# Patient Record
Sex: Female | Born: 1940 | Race: Black or African American | Hispanic: No | Marital: Single | Smoking: Current every day smoker
Health system: Southern US, Community
[De-identification: ages and names within clinical notes are randomized; demographics above are authoritative.]

## PROBLEM LIST (undated history)

## (undated) DIAGNOSIS — N184 Chronic kidney disease, stage 4 (severe): Secondary | ICD-10-CM

## (undated) DIAGNOSIS — I509 Heart failure, unspecified: Secondary | ICD-10-CM

## (undated) DIAGNOSIS — I428 Other cardiomyopathies: Secondary | ICD-10-CM

## (undated) DIAGNOSIS — I10 Essential (primary) hypertension: Secondary | ICD-10-CM

## (undated) DIAGNOSIS — I878 Other specified disorders of veins: Secondary | ICD-10-CM

## (undated) DIAGNOSIS — I4891 Unspecified atrial fibrillation: Secondary | ICD-10-CM

## (undated) DIAGNOSIS — Z7901 Long term (current) use of anticoagulants: Secondary | ICD-10-CM

## (undated) DIAGNOSIS — I4729 Other ventricular tachycardia: Secondary | ICD-10-CM

## (undated) DIAGNOSIS — I472 Ventricular tachycardia: Secondary | ICD-10-CM

## (undated) DIAGNOSIS — IMO0002 Reserved for concepts with insufficient information to code with codable children: Secondary | ICD-10-CM

## (undated) DIAGNOSIS — M329 Systemic lupus erythematosus, unspecified: Secondary | ICD-10-CM

## (undated) DIAGNOSIS — D649 Anemia, unspecified: Secondary | ICD-10-CM

## (undated) DIAGNOSIS — N189 Chronic kidney disease, unspecified: Secondary | ICD-10-CM

## (undated) DIAGNOSIS — G4733 Obstructive sleep apnea (adult) (pediatric): Secondary | ICD-10-CM

## (undated) HISTORY — DX: Long term (current) use of anticoagulants: Z79.01

## (undated) HISTORY — DX: Essential (primary) hypertension: I10

## (undated) HISTORY — DX: Other specified disorders of veins: I87.8

## (undated) HISTORY — DX: Unspecified atrial fibrillation: I48.91

## (undated) HISTORY — DX: Other cardiomyopathies: I42.8

## (undated) HISTORY — DX: Other ventricular tachycardia: I47.29

## (undated) HISTORY — DX: Chronic kidney disease, stage 4 (severe): N18.4

## (undated) HISTORY — DX: Ventricular tachycardia: I47.2

## (undated) HISTORY — DX: Obstructive sleep apnea (adult) (pediatric): G47.33

---

## 1985-01-24 HISTORY — PX: APPENDECTOMY: SHX54

## 1989-01-24 HISTORY — PX: INCISION AND DRAINAGE INTRA ORAL ABSCESS: SHX1802

## 1996-01-25 DIAGNOSIS — I428 Other cardiomyopathies: Secondary | ICD-10-CM

## 1996-01-25 HISTORY — DX: Other cardiomyopathies: I42.8

## 2001-02-05 ENCOUNTER — Ambulatory Visit (HOSPITAL_COMMUNITY): Admission: RE | Admit: 2001-02-05 | Discharge: 2001-02-05 | Payer: Self-pay | Admitting: Internal Medicine

## 2001-02-05 ENCOUNTER — Encounter: Payer: Self-pay | Admitting: Internal Medicine

## 2001-02-06 ENCOUNTER — Ambulatory Visit (HOSPITAL_COMMUNITY): Admission: RE | Admit: 2001-02-06 | Discharge: 2001-02-06 | Payer: Self-pay | Admitting: Internal Medicine

## 2001-02-22 ENCOUNTER — Ambulatory Visit (HOSPITAL_COMMUNITY): Admission: RE | Admit: 2001-02-22 | Discharge: 2001-02-22 | Payer: Self-pay | Admitting: Cardiology

## 2001-03-28 ENCOUNTER — Ambulatory Visit (HOSPITAL_COMMUNITY): Admission: RE | Admit: 2001-03-28 | Discharge: 2001-03-28 | Payer: Self-pay | Admitting: Internal Medicine

## 2003-03-22 ENCOUNTER — Ambulatory Visit (HOSPITAL_COMMUNITY): Admission: RE | Admit: 2003-03-22 | Discharge: 2003-03-22 | Payer: Self-pay | Admitting: Internal Medicine

## 2004-02-06 ENCOUNTER — Ambulatory Visit (HOSPITAL_COMMUNITY): Admission: RE | Admit: 2004-02-06 | Discharge: 2004-02-06 | Payer: Self-pay | Admitting: Internal Medicine

## 2004-02-26 ENCOUNTER — Ambulatory Visit: Payer: Self-pay | Admitting: Internal Medicine

## 2004-03-11 ENCOUNTER — Ambulatory Visit: Payer: Self-pay | Admitting: *Deleted

## 2004-05-11 ENCOUNTER — Ambulatory Visit: Admission: RE | Admit: 2004-05-11 | Discharge: 2004-05-11 | Payer: Self-pay | Admitting: Pulmonary Disease

## 2004-05-12 ENCOUNTER — Ambulatory Visit (HOSPITAL_COMMUNITY): Admission: RE | Admit: 2004-05-12 | Discharge: 2004-05-12 | Payer: Self-pay | Admitting: *Deleted

## 2004-05-13 ENCOUNTER — Ambulatory Visit: Payer: Self-pay | Admitting: *Deleted

## 2004-10-27 ENCOUNTER — Ambulatory Visit: Payer: Self-pay | Admitting: *Deleted

## 2004-12-09 ENCOUNTER — Ambulatory Visit: Payer: Self-pay | Admitting: *Deleted

## 2005-04-13 ENCOUNTER — Ambulatory Visit: Payer: Self-pay | Admitting: Internal Medicine

## 2005-04-15 ENCOUNTER — Inpatient Hospital Stay (HOSPITAL_BASED_OUTPATIENT_CLINIC_OR_DEPARTMENT_OTHER): Admission: RE | Admit: 2005-04-15 | Discharge: 2005-04-15 | Payer: Self-pay | Admitting: Cardiology

## 2005-04-15 ENCOUNTER — Ambulatory Visit: Payer: Self-pay | Admitting: Cardiology

## 2005-05-03 ENCOUNTER — Ambulatory Visit (HOSPITAL_COMMUNITY): Admission: RE | Admit: 2005-05-03 | Discharge: 2005-05-03 | Payer: Self-pay | Admitting: Internal Medicine

## 2005-05-03 ENCOUNTER — Ambulatory Visit: Payer: Self-pay | Admitting: Cardiology

## 2005-05-03 ENCOUNTER — Encounter: Payer: Self-pay | Admitting: Cardiology

## 2005-05-24 HISTORY — PX: CARDIAC DEFIBRILLATOR PLACEMENT: SHX171

## 2005-05-25 ENCOUNTER — Ambulatory Visit: Payer: Self-pay | Admitting: Emergency Medicine

## 2005-05-25 ENCOUNTER — Ambulatory Visit: Payer: Self-pay | Admitting: Internal Medicine

## 2005-05-25 ENCOUNTER — Inpatient Hospital Stay (HOSPITAL_COMMUNITY): Admission: RE | Admit: 2005-05-25 | Discharge: 2005-06-02 | Payer: Self-pay | Admitting: Internal Medicine

## 2005-06-03 ENCOUNTER — Ambulatory Visit: Payer: Self-pay | Admitting: Cardiology

## 2005-06-06 ENCOUNTER — Ambulatory Visit: Payer: Self-pay | Admitting: Cardiology

## 2005-06-16 ENCOUNTER — Ambulatory Visit: Payer: Self-pay

## 2005-06-17 ENCOUNTER — Ambulatory Visit: Payer: Self-pay | Admitting: *Deleted

## 2005-07-01 ENCOUNTER — Ambulatory Visit: Payer: Self-pay | Admitting: Internal Medicine

## 2005-07-03 ENCOUNTER — Ambulatory Visit: Admission: RE | Admit: 2005-07-03 | Discharge: 2005-07-03 | Payer: Self-pay | Admitting: *Deleted

## 2005-07-04 ENCOUNTER — Ambulatory Visit: Payer: Self-pay | Admitting: *Deleted

## 2005-07-07 ENCOUNTER — Ambulatory Visit: Payer: Self-pay | Admitting: Pulmonary Disease

## 2005-07-22 ENCOUNTER — Ambulatory Visit: Payer: Self-pay | Admitting: *Deleted

## 2005-07-29 ENCOUNTER — Ambulatory Visit: Payer: Self-pay | Admitting: Internal Medicine

## 2005-08-10 ENCOUNTER — Ambulatory Visit (HOSPITAL_COMMUNITY): Admission: RE | Admit: 2005-08-10 | Discharge: 2005-08-10 | Payer: Self-pay | Admitting: Obstetrics and Gynecology

## 2005-08-11 ENCOUNTER — Ambulatory Visit: Payer: Self-pay | Admitting: *Deleted

## 2005-08-14 ENCOUNTER — Ambulatory Visit: Payer: Self-pay | Admitting: Internal Medicine

## 2005-08-24 ENCOUNTER — Ambulatory Visit: Payer: Self-pay | Admitting: Internal Medicine

## 2005-09-06 ENCOUNTER — Ambulatory Visit: Payer: Self-pay | Admitting: Internal Medicine

## 2005-09-15 ENCOUNTER — Ambulatory Visit: Payer: Self-pay | Admitting: Cardiology

## 2005-10-19 ENCOUNTER — Ambulatory Visit: Payer: Self-pay | Admitting: Cardiology

## 2005-11-03 ENCOUNTER — Ambulatory Visit: Payer: Self-pay | Admitting: Cardiology

## 2005-12-01 ENCOUNTER — Ambulatory Visit: Payer: Self-pay | Admitting: Internal Medicine

## 2005-12-08 ENCOUNTER — Ambulatory Visit: Payer: Self-pay | Admitting: Cardiology

## 2006-01-04 ENCOUNTER — Ambulatory Visit: Payer: Self-pay | Admitting: Cardiovascular Disease

## 2006-01-26 ENCOUNTER — Ambulatory Visit: Payer: Self-pay | Admitting: Cardiology

## 2006-02-15 ENCOUNTER — Ambulatory Visit: Payer: Self-pay | Admitting: Cardiology

## 2006-03-13 ENCOUNTER — Ambulatory Visit: Payer: Self-pay | Admitting: Internal Medicine

## 2006-03-23 ENCOUNTER — Ambulatory Visit: Payer: Self-pay | Admitting: Cardiology

## 2006-04-07 ENCOUNTER — Ambulatory Visit: Payer: Self-pay | Admitting: Internal Medicine

## 2006-05-05 ENCOUNTER — Ambulatory Visit: Payer: Self-pay | Admitting: Cardiology

## 2006-05-19 ENCOUNTER — Ambulatory Visit: Payer: Self-pay | Admitting: Internal Medicine

## 2006-06-02 ENCOUNTER — Ambulatory Visit: Payer: Self-pay | Admitting: Cardiology

## 2006-06-05 ENCOUNTER — Ambulatory Visit: Payer: Self-pay | Admitting: Internal Medicine

## 2006-06-16 ENCOUNTER — Ambulatory Visit: Payer: Self-pay | Admitting: Cardiology

## 2006-06-30 ENCOUNTER — Ambulatory Visit: Payer: Self-pay | Admitting: Internal Medicine

## 2006-07-14 ENCOUNTER — Ambulatory Visit: Payer: Self-pay | Admitting: Cardiovascular Disease

## 2006-08-11 ENCOUNTER — Ambulatory Visit: Payer: Self-pay | Admitting: Cardiology

## 2006-08-21 ENCOUNTER — Ambulatory Visit: Payer: Self-pay | Admitting: Internal Medicine

## 2006-08-24 ENCOUNTER — Ambulatory Visit: Payer: Self-pay | Admitting: Cardiology

## 2006-08-31 ENCOUNTER — Ambulatory Visit: Payer: Self-pay | Admitting: Cardiology

## 2006-09-14 ENCOUNTER — Ambulatory Visit: Payer: Self-pay | Admitting: Cardiology

## 2006-09-28 ENCOUNTER — Ambulatory Visit: Payer: Self-pay | Admitting: Cardiology

## 2006-10-12 ENCOUNTER — Ambulatory Visit: Payer: Self-pay | Admitting: Cardiology

## 2006-11-09 ENCOUNTER — Ambulatory Visit: Payer: Self-pay | Admitting: Cardiovascular Disease

## 2006-12-15 ENCOUNTER — Ambulatory Visit: Payer: Self-pay | Admitting: Cardiology

## 2007-01-12 ENCOUNTER — Ambulatory Visit: Payer: Self-pay | Admitting: Cardiology

## 2007-02-15 ENCOUNTER — Ambulatory Visit: Payer: Self-pay | Admitting: Internal Medicine

## 2007-03-02 ENCOUNTER — Ambulatory Visit: Payer: Self-pay | Admitting: Cardiology

## 2007-03-06 ENCOUNTER — Ambulatory Visit: Payer: Self-pay | Admitting: Internal Medicine

## 2007-03-16 ENCOUNTER — Ambulatory Visit: Payer: Self-pay | Admitting: Cardiology

## 2007-03-30 ENCOUNTER — Ambulatory Visit: Payer: Self-pay | Admitting: Cardiology

## 2007-04-24 ENCOUNTER — Inpatient Hospital Stay (HOSPITAL_COMMUNITY): Admission: EM | Admit: 2007-04-24 | Discharge: 2007-04-30 | Payer: Self-pay | Admitting: Emergency Medicine

## 2007-04-24 ENCOUNTER — Ambulatory Visit: Payer: Self-pay | Admitting: Cardiology

## 2007-05-03 ENCOUNTER — Ambulatory Visit: Payer: Self-pay | Admitting: Cardiology

## 2007-05-17 ENCOUNTER — Ambulatory Visit: Payer: Self-pay

## 2007-05-17 ENCOUNTER — Encounter: Payer: Self-pay | Admitting: Cardiovascular Disease

## 2007-05-17 ENCOUNTER — Ambulatory Visit: Payer: Self-pay | Admitting: Internal Medicine

## 2007-05-24 ENCOUNTER — Ambulatory Visit: Payer: Self-pay | Admitting: Internal Medicine

## 2007-05-24 LAB — CONVERTED CEMR LAB
Albumin: 3.6 g/dL (ref 3.5–5.2)
Alkaline Phosphatase: 74 units/L (ref 39–117)
BUN: 38 mg/dL — ABNORMAL HIGH (ref 6–23)
Bilirubin, Direct: 0.1 mg/dL (ref 0.0–0.3)
GFR calc Af Amer: 23 mL/min
GFR calc non Af Amer: 19 mL/min
Potassium: 3.8 meq/L (ref 3.5–5.1)
Sodium: 140 meq/L (ref 135–145)
Total Bilirubin: 0.6 mg/dL (ref 0.3–1.2)

## 2007-05-31 ENCOUNTER — Ambulatory Visit: Payer: Self-pay | Admitting: Cardiology

## 2007-06-11 ENCOUNTER — Ambulatory Visit: Payer: Self-pay | Admitting: Internal Medicine

## 2007-06-26 ENCOUNTER — Ambulatory Visit: Payer: Self-pay | Admitting: Cardiology

## 2007-07-10 ENCOUNTER — Ambulatory Visit: Payer: Self-pay | Admitting: Cardiology

## 2007-07-30 ENCOUNTER — Ambulatory Visit: Payer: Self-pay | Admitting: Cardiology

## 2007-08-06 ENCOUNTER — Ambulatory Visit: Payer: Self-pay | Admitting: Internal Medicine

## 2007-08-14 ENCOUNTER — Ambulatory Visit: Payer: Self-pay | Admitting: Cardiovascular Disease

## 2007-09-04 ENCOUNTER — Ambulatory Visit: Payer: Self-pay | Admitting: Cardiology

## 2007-09-19 ENCOUNTER — Ambulatory Visit: Payer: Self-pay | Admitting: Cardiology

## 2007-09-27 ENCOUNTER — Ambulatory Visit: Payer: Self-pay | Admitting: Cardiology

## 2007-10-15 ENCOUNTER — Ambulatory Visit: Payer: Self-pay | Admitting: Cardiology

## 2007-11-06 ENCOUNTER — Ambulatory Visit: Payer: Self-pay | Admitting: Internal Medicine

## 2007-11-12 ENCOUNTER — Ambulatory Visit: Payer: Self-pay | Admitting: Cardiology

## 2007-12-13 ENCOUNTER — Ambulatory Visit: Payer: Self-pay | Admitting: Cardiology

## 2007-12-13 ENCOUNTER — Ambulatory Visit (HOSPITAL_COMMUNITY): Admission: RE | Admit: 2007-12-13 | Discharge: 2007-12-13 | Payer: Self-pay | Admitting: Internal Medicine

## 2007-12-24 ENCOUNTER — Ambulatory Visit: Payer: Self-pay | Admitting: Cardiology

## 2008-01-21 ENCOUNTER — Ambulatory Visit: Payer: Self-pay | Admitting: Cardiology

## 2008-01-29 ENCOUNTER — Ambulatory Visit (HOSPITAL_COMMUNITY): Admission: RE | Admit: 2008-01-29 | Discharge: 2008-01-29 | Payer: Self-pay | Admitting: Internal Medicine

## 2008-03-20 ENCOUNTER — Ambulatory Visit: Payer: Self-pay | Admitting: Cardiology

## 2008-04-03 ENCOUNTER — Encounter: Payer: Self-pay | Admitting: Internal Medicine

## 2008-04-17 ENCOUNTER — Ambulatory Visit: Payer: Self-pay | Admitting: Cardiology

## 2008-05-22 ENCOUNTER — Ambulatory Visit: Payer: Self-pay | Admitting: Cardiology

## 2008-06-19 ENCOUNTER — Ambulatory Visit: Payer: Self-pay | Admitting: Cardiology

## 2008-07-21 ENCOUNTER — Ambulatory Visit: Payer: Self-pay | Admitting: Cardiology

## 2008-08-08 ENCOUNTER — Encounter: Payer: Self-pay | Admitting: Cardiology

## 2008-08-08 ENCOUNTER — Ambulatory Visit: Payer: Self-pay | Admitting: Cardiology

## 2008-08-08 ENCOUNTER — Ambulatory Visit (HOSPITAL_COMMUNITY): Admission: RE | Admit: 2008-08-08 | Discharge: 2008-08-08 | Payer: Self-pay | Admitting: Cardiology

## 2008-08-08 DIAGNOSIS — Z95810 Presence of automatic (implantable) cardiac defibrillator: Secondary | ICD-10-CM | POA: Insufficient documentation

## 2008-08-11 LAB — CONVERTED CEMR LAB
AST: 21 units/L (ref 0–37)
Albumin: 3.8 g/dL (ref 3.5–5.2)
Alkaline Phosphatase: 91 units/L (ref 39–117)
BUN: 32 mg/dL — ABNORMAL HIGH (ref 6–23)
Basophils Absolute: 0.1 10*3/uL (ref 0.0–0.1)
Basophils Relative: 1 % (ref 0–1)
Calcium: 8.9 mg/dL (ref 8.4–10.5)
Cholesterol: 162 mg/dL (ref 0–200)
Hemoglobin: 15.9 g/dL — ABNORMAL HIGH (ref 12.0–15.0)
LDL Cholesterol: 98 mg/dL (ref 0–99)
MCHC: 32 g/dL (ref 30.0–36.0)
Magnesium: 1.8 mg/dL (ref 1.5–2.5)
Monocytes Absolute: 0.4 10*3/uL (ref 0.1–1.0)
Monocytes Relative: 9 % (ref 3–12)
Platelets: 222 10*3/uL (ref 150–400)
Potassium: 4.3 meq/L (ref 3.5–5.3)
Total Protein: 8 g/dL (ref 6.0–8.3)
VLDL: 16 mg/dL (ref 0–40)

## 2008-08-18 ENCOUNTER — Telehealth (INDEPENDENT_AMBULATORY_CARE_PROVIDER_SITE_OTHER): Payer: Self-pay

## 2008-08-18 ENCOUNTER — Ambulatory Visit: Payer: Self-pay | Admitting: Cardiology

## 2008-09-08 ENCOUNTER — Encounter: Payer: Self-pay | Admitting: *Deleted

## 2008-09-15 ENCOUNTER — Encounter: Payer: Self-pay | Admitting: Cardiology

## 2008-09-17 ENCOUNTER — Encounter: Payer: Self-pay | Admitting: Cardiology

## 2008-09-17 LAB — CONVERTED CEMR LAB
Chloride: 103 meq/L (ref 96–112)
Potassium: 3.8 meq/L (ref 3.5–5.3)

## 2008-09-18 ENCOUNTER — Encounter (INDEPENDENT_AMBULATORY_CARE_PROVIDER_SITE_OTHER): Payer: Self-pay | Admitting: *Deleted

## 2008-09-18 ENCOUNTER — Encounter: Payer: Self-pay | Admitting: Cardiology

## 2008-09-18 ENCOUNTER — Ambulatory Visit: Payer: Self-pay | Admitting: Cardiology

## 2008-09-18 LAB — CONVERTED CEMR LAB: POC INR: 5.6

## 2008-09-26 ENCOUNTER — Ambulatory Visit: Payer: Self-pay | Admitting: Cardiology

## 2008-09-26 LAB — CONVERTED CEMR LAB: POC INR: 3

## 2008-10-09 ENCOUNTER — Ambulatory Visit: Payer: Self-pay | Admitting: Cardiology

## 2008-10-15 ENCOUNTER — Ambulatory Visit: Payer: Self-pay | Admitting: Cardiology

## 2008-10-16 ENCOUNTER — Encounter (INDEPENDENT_AMBULATORY_CARE_PROVIDER_SITE_OTHER): Payer: Self-pay | Admitting: *Deleted

## 2008-10-21 ENCOUNTER — Encounter: Payer: Self-pay | Admitting: Cardiology

## 2008-10-21 LAB — CONVERTED CEMR LAB
BUN: 33 mg/dL — ABNORMAL HIGH (ref 6–23)
Potassium: 4.4 meq/L (ref 3.5–5.3)
Sodium: 140 meq/L (ref 135–145)

## 2008-10-22 ENCOUNTER — Encounter (INDEPENDENT_AMBULATORY_CARE_PROVIDER_SITE_OTHER): Payer: Self-pay | Admitting: *Deleted

## 2008-10-29 ENCOUNTER — Ambulatory Visit: Payer: Self-pay | Admitting: Cardiology

## 2008-11-03 ENCOUNTER — Ambulatory Visit: Payer: Self-pay | Admitting: Cardiology

## 2008-11-03 DIAGNOSIS — G4730 Sleep apnea, unspecified: Secondary | ICD-10-CM | POA: Insufficient documentation

## 2008-11-07 ENCOUNTER — Telehealth (INDEPENDENT_AMBULATORY_CARE_PROVIDER_SITE_OTHER): Payer: Self-pay | Admitting: *Deleted

## 2008-11-11 ENCOUNTER — Ambulatory Visit: Payer: Self-pay | Admitting: Internal Medicine

## 2008-11-11 DIAGNOSIS — I5022 Chronic systolic (congestive) heart failure: Secondary | ICD-10-CM

## 2008-11-13 ENCOUNTER — Telehealth: Payer: Self-pay | Admitting: Cardiology

## 2008-11-20 ENCOUNTER — Ambulatory Visit: Payer: Self-pay | Admitting: Cardiology

## 2008-12-06 ENCOUNTER — Encounter (INDEPENDENT_AMBULATORY_CARE_PROVIDER_SITE_OTHER): Payer: Self-pay | Admitting: *Deleted

## 2008-12-06 LAB — CONVERTED CEMR LAB
ALT: 12 units/L
AST: 17 units/L
Albumin: 3.9 g/dL
Albumin: 3.9 g/dL (ref 3.5–5.2)
CO2: 26 meq/L
CO2: 26 meq/L (ref 19–32)
Chloride: 105 meq/L
Chloride: 105 meq/L (ref 96–112)
Creatinine, Ser: 1.88 mg/dL
Creatinine, Ser: 1.88 mg/dL — ABNORMAL HIGH (ref 0.40–1.20)
Glucose, Bld: 72 mg/dL
Total Bilirubin: 0.5 mg/dL (ref 0.3–1.2)
Total Protein: 7.4 g/dL
Total Protein: 7.4 g/dL (ref 6.0–8.3)

## 2008-12-11 ENCOUNTER — Encounter (INDEPENDENT_AMBULATORY_CARE_PROVIDER_SITE_OTHER): Payer: Self-pay | Admitting: *Deleted

## 2008-12-24 ENCOUNTER — Ambulatory Visit: Payer: Self-pay | Admitting: Cardiology

## 2009-01-28 ENCOUNTER — Ambulatory Visit: Payer: Self-pay | Admitting: Cardiology

## 2009-02-24 ENCOUNTER — Encounter: Payer: Self-pay | Admitting: Internal Medicine

## 2009-02-25 ENCOUNTER — Ambulatory Visit: Payer: Self-pay | Admitting: Cardiology

## 2009-03-11 ENCOUNTER — Ambulatory Visit: Payer: Self-pay | Admitting: Cardiovascular Disease

## 2009-03-11 LAB — CONVERTED CEMR LAB: POC INR: 3.1

## 2009-03-20 ENCOUNTER — Encounter: Payer: Self-pay | Admitting: Cardiology

## 2009-03-21 ENCOUNTER — Encounter: Payer: Self-pay | Admitting: Internal Medicine

## 2009-03-23 ENCOUNTER — Ambulatory Visit: Payer: Self-pay | Admitting: Internal Medicine

## 2009-03-27 ENCOUNTER — Encounter (INDEPENDENT_AMBULATORY_CARE_PROVIDER_SITE_OTHER): Payer: Self-pay | Admitting: *Deleted

## 2009-03-31 ENCOUNTER — Encounter: Payer: Self-pay | Admitting: Internal Medicine

## 2009-03-31 ENCOUNTER — Ambulatory Visit: Payer: Self-pay | Admitting: Cardiology

## 2009-03-31 ENCOUNTER — Ambulatory Visit (HOSPITAL_COMMUNITY): Admission: RE | Admit: 2009-03-31 | Discharge: 2009-03-31 | Payer: Self-pay | Admitting: Cardiology

## 2009-03-31 DIAGNOSIS — M1990 Unspecified osteoarthritis, unspecified site: Secondary | ICD-10-CM | POA: Insufficient documentation

## 2009-03-31 LAB — CONVERTED CEMR LAB
Basophils Absolute: 0 10*3/uL (ref 0.0–0.1)
Eosinophils Absolute: 0.3 10*3/uL (ref 0.0–0.7)
Lymphocytes Relative: 38 % (ref 12–46)
Lymphs Abs: 1.8 10*3/uL (ref 0.7–4.0)
Monocytes Absolute: 0.4 10*3/uL (ref 0.1–1.0)
Monocytes Relative: 9 % (ref 3–12)
POC INR: 2.6
TSH: 1.523 microintl units/mL (ref 0.350–4.500)
WBC: 4.8 10*3/uL (ref 4.0–10.5)

## 2009-04-17 ENCOUNTER — Encounter (INDEPENDENT_AMBULATORY_CARE_PROVIDER_SITE_OTHER): Payer: Self-pay | Admitting: *Deleted

## 2009-04-17 LAB — CONVERTED CEMR LAB: OCCULT 2: NEGATIVE

## 2009-04-27 ENCOUNTER — Ambulatory Visit: Payer: Self-pay | Admitting: Cardiology

## 2009-04-27 LAB — CONVERTED CEMR LAB: POC INR: 1.4

## 2009-05-13 ENCOUNTER — Ambulatory Visit: Payer: Self-pay | Admitting: Cardiology

## 2009-06-01 ENCOUNTER — Ambulatory Visit: Payer: Self-pay | Admitting: Cardiology

## 2009-06-10 ENCOUNTER — Ambulatory Visit: Payer: Self-pay | Admitting: Cardiology

## 2009-06-24 ENCOUNTER — Ambulatory Visit: Payer: Self-pay | Admitting: Cardiology

## 2009-06-26 ENCOUNTER — Encounter (INDEPENDENT_AMBULATORY_CARE_PROVIDER_SITE_OTHER): Payer: Self-pay | Admitting: *Deleted

## 2009-07-09 ENCOUNTER — Ambulatory Visit: Payer: Self-pay | Admitting: Cardiology

## 2009-07-30 ENCOUNTER — Ambulatory Visit: Payer: Self-pay | Admitting: Cardiology

## 2009-07-30 ENCOUNTER — Encounter (INDEPENDENT_AMBULATORY_CARE_PROVIDER_SITE_OTHER): Payer: Self-pay | Admitting: *Deleted

## 2009-07-30 LAB — CONVERTED CEMR LAB: POC INR: 5.5

## 2009-08-13 ENCOUNTER — Ambulatory Visit: Payer: Self-pay | Admitting: Cardiology

## 2009-08-13 LAB — CONVERTED CEMR LAB: POC INR: 2

## 2009-08-27 ENCOUNTER — Encounter (INDEPENDENT_AMBULATORY_CARE_PROVIDER_SITE_OTHER): Payer: Self-pay | Admitting: *Deleted

## 2009-09-03 ENCOUNTER — Ambulatory Visit: Payer: Self-pay | Admitting: Cardiology

## 2009-09-03 LAB — CONVERTED CEMR LAB: POC INR: 2.4

## 2009-09-24 ENCOUNTER — Ambulatory Visit: Payer: Self-pay | Admitting: Cardiology

## 2009-09-24 LAB — CONVERTED CEMR LAB: POC INR: 2.5

## 2009-10-22 ENCOUNTER — Ambulatory Visit: Payer: Self-pay | Admitting: Cardiology

## 2009-11-13 ENCOUNTER — Encounter (INDEPENDENT_AMBULATORY_CARE_PROVIDER_SITE_OTHER): Payer: Self-pay | Admitting: *Deleted

## 2009-11-16 ENCOUNTER — Ambulatory Visit: Payer: Self-pay | Admitting: Cardiology

## 2009-11-16 LAB — CONVERTED CEMR LAB: POC INR: 3.2

## 2009-11-27 ENCOUNTER — Ambulatory Visit (HOSPITAL_COMMUNITY): Admission: RE | Admit: 2009-11-27 | Discharge: 2009-11-27 | Payer: Self-pay | Admitting: Nephrology

## 2009-12-10 ENCOUNTER — Encounter (INDEPENDENT_AMBULATORY_CARE_PROVIDER_SITE_OTHER): Payer: Self-pay | Admitting: *Deleted

## 2009-12-14 ENCOUNTER — Ambulatory Visit: Payer: Self-pay | Admitting: Cardiology

## 2009-12-14 LAB — CONVERTED CEMR LAB: POC INR: 3.3

## 2009-12-31 ENCOUNTER — Encounter (INDEPENDENT_AMBULATORY_CARE_PROVIDER_SITE_OTHER): Payer: Self-pay | Admitting: *Deleted

## 2010-01-01 ENCOUNTER — Encounter (INDEPENDENT_AMBULATORY_CARE_PROVIDER_SITE_OTHER): Payer: Self-pay | Admitting: *Deleted

## 2010-01-11 ENCOUNTER — Ambulatory Visit: Payer: Self-pay | Admitting: Cardiology

## 2010-01-20 ENCOUNTER — Encounter (INDEPENDENT_AMBULATORY_CARE_PROVIDER_SITE_OTHER): Payer: Self-pay | Admitting: *Deleted

## 2010-01-28 ENCOUNTER — Encounter: Payer: Self-pay | Admitting: Internal Medicine

## 2010-02-08 ENCOUNTER — Ambulatory Visit: Admission: RE | Admit: 2010-02-08 | Discharge: 2010-02-08 | Payer: Self-pay | Source: Home / Self Care

## 2010-02-14 ENCOUNTER — Encounter: Payer: Self-pay | Admitting: Nephrology

## 2010-02-23 NOTE — Medication Information (Signed)
Summary: ccr-lr  Anticoagulant Therapy  Managed by: Vashti Hey, RN PCP: Dr. Catalina Pizza Supervising MD: Dietrich Pates MD, Molly Maduro Indication 1: Atrial Fibrillation (ICD-427.31) Lab Used: [facility] [place] Site: [place] INR POC 2.0  Dietary changes: no    Health status changes: no    Bleeding/hemorrhagic complications: no    Recent/future hospitalizations: no    Any changes in medication regimen? yes       Details: started on Wellbutrin 100mg  qd for smoking cessation  Recent/future dental: no  Any missed doses?: no       Is patient compliant with meds? yes       Allergies: 1)  ! Penicillin  Anticoagulation Management History:      The patient is taking warfarin and comes in today for a routine follow up visit.  Positive risk factors for bleeding include an age of 68 years or older and presence of serious comorbidities.  The bleeding index is 'intermediate risk'.  Positive CHADS2 values include History of CHF and History of HTN.  Negative CHADS2 values include Age > 77 years old.  The start date was 06/02/2005.  Anticoagulation responsible provider: Dietrich Pates MD, Molly Maduro.  INR POC: 2.0.  Cuvette Lot#: [id number].  Exp: 04/12.    Anticoagulation Management Assessment/Plan:      The patient's current anticoagulation dose is Coumadin 5 mg tabs: as directed.  The target INR is 2 - 3.  The next INR is due 08/27/2009.  Anticoagulation instructions were given to patient.  Results were reviewed/authorized by Vashti Hey, RN.  She was notified by Vashti Hey RN.         Prior Anticoagulation Instructions: INR 5.5 Hold coumadin x 3 nights then decrease dose to 5mg  once daily except 2.5mg  on Mondays and Thursdays  Current Anticoagulation Instructions: INR 2.0 Continue coumadin 5mg  once daily except 2.5mg  on Mondays and Thursdays

## 2010-02-23 NOTE — Assessment & Plan Note (Signed)
Summary: ROV   Visit Type:  Follow-up Primary Provider:  Dr. Catalina Pizza   History of Present Illness: Return visit for this very pleasant 70 year old woman with a long-standing nonischemic cardiomyopathy for which an AICD has been placed, but has not been required to treat arrhythmia.  She continues to do remarkably well from a symptomatic standpoint with essentially no dyspnea, no orthopnea, no PND, no perceptible edema and no other signs or symptoms of chronic congestive heart failure despite a markedly depressed ejection fraction.  She has had no significant recent illnesses.  She reports recent laboratory tests, but does not know what specific studies were performed.  She has seen Dr. Graciela Husbands within the past 6 months, but has had no further assessment of her device since then.  I cannot determine whether she is supposed to be seen in office or whether she has a device to monitor her defibrillator from home.  Most of the symptoms she was experiencing which she attributed to gout at our last meeting have resolved; however, she continues to have pain and swelling of the right wrist with limitation of mobility.  She is not aware that her uric acid has been measured.  She is on no medication to lower uric acid.   Current Medications (verified): 1)  Amiodarone Hcl 200 Mg Tabs (Amiodarone Hcl) .... Take 1 Tablet Daily 2)  Coumadin 5 Mg Tabs (Warfarin Sodium) .... As Directed 3)  Enalapril Maleate 10 Mg Tabs (Enalapril Maleate) .Marland Kitchen.. 1 Tab Two Times A Day 4)  Spironolactone 25 Mg Tabs (Spironolactone) .Marland Kitchen.. 1 Tab Once Daily 5)  Lanoxin 0.125 Mg Tabs (Digoxin) .... Take One Tablet By Mouth Every Other Day 6)  Furosemide 40 Mg Tabs (Furosemide) .... Take 1 Tablet By Mouth Two Times A Day 7)  Tylenol Extra Strength 500 Mg Tabs (Acetaminophen) .... As Needed 8)  Colcrys 0.6 Mg Tabs (Colchicine) .... Take 1 Tablet By Mouth Once A Day 9)  Hydrocodone-Acetaminophen 5-500 Mg Tabs (Hydrocodone-Acetaminophen)  .... As Needed 10)  Carvedilol 25 Mg Tabs (Carvedilol) .... Take 1 Tablet By Mouth Two Times A Day  Allergies (verified): 1)  ! Penicillin  Past History:  PMH, FH, and Social History reviewed and updated.  Past Medical History: Nonischemic cardiomyopathy_EF of 25% in 1998, 15% in 2006; catheterization in 3/07-50% D1; luminal      irregularities in the other vessels; PA-65/25. 20% EF in 04/2007 with moderate MR and pulmonary htn AICD/biventricular pacing--05/2005 HYPERTENSION (ICD-401.9) Atrial fibrillation plus nonsustained ventricular tachycardia --> amiodarone treatment Tobacco abuse: 30 pack years; discontinued as of 2010 Obstructive sleep apnea Chronic kidney disease Venous stasis GOUT (ICD-274.9)  Past Surgical History: Appendectomy-87 Incision and drainage of submandibular abscess in 1991 Automatic implantable cardiac defibrillator (Medtronic insync) in 05/2005  Family History: Father:deceased age 56 renal failure Mother:deceased 31 with myocardial infarction Siblings-5 sisters and 3 brothers; one brother suffered a fatal myocardial infarction  Social History: 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  Vital Signs:  Patient profile:   70 year old female Weight:      208 pounds Pulse rate:  87 / minute BP sitting:   108 / 75  (right arm)  Vitals Entered By: Dreama Saa, CNA (March 31, 2009 12:47 PM)  Physical Exam  General:  Well developed; no acute distress; moderately overweight   Neck-No JVD; no carotid bruits: Lungs-No tachypnea; no rhonchi; no wheezes; Few rales at the left base Cardiovascular-normal PMI; distant S1 and S2; no  third heart sound; minimal systolic murmur Abdomen-BS normal; soft and non-tender without masses or organomegaly:  Musculoskeletal-No deformities, no cyanosis or clubbing; mild synovial thickening of the right wrist with limited range of motion Neurologic-Normal cranial nerves; symmetric strength and tone:  Skin-Warm, depigmentation in the region of the philtrum Extremities-Nl distal pulses;trace edema: Eyes:  PERRLA/EOM intact; conjunctiva and lids normal.    ICD Specifications Following MD:  Sherryl Manges, MD     ICD Vendor:  Medtronic     ICD Model Number:  7299     ICD Serial Number:  [id number] ICD DOI:  05/25/2005     ICD Implanting MD:  Sherryl Manges, MD  Lead 1:    Location: RA     DOI: 05/25/2005     Model #: [id number]     Serial #: [id number]     Status: active Lead 2:    Location: RV     DOI: 05/25/2005     Model #: [id number]     Serial #: [id number]     Status: active  Indications::  NICM   ICD Follow Up ICD Dependent:  No      Brady Parameters Mode DDDR     Lower Rate Limit:  70     Upper Rate Limit 120 PAV 320     Sensed AV Delay:  320  Tachy Zones VF:  207     VT:  250 FVT VIA VF     VT1:  171     Impression & Recommendations:  Problem # 1:  DYSPNEA (ICD-786.05) Patient is generally asymptomatic at the present time with a sedentary lifestyle.  She has no symptoms at present that require further testing.  Problem # 2:  SYSTOLIC HEART FAILURE, CHRONIC (ICD-428.22) Congestive heart failure due to nonischemic cardiomyopathy is well compensated.  Current medications will be continued.  Problem # 3:  AUTOMATIC IMPLANTABLE CARDIAC DEFIBRILLATOR SITU (ICD-V45.02) Patient's device followup will be moved to the [place] office and to Dr. Ladona Ridgel.  We will contact the EP service to advise her concerning ongoing monitoring of her device.  Problem # 4:  HYPERTENSION (ICD-401.9) Elevated blood pressure is well controlled with current medication, which will be continued.   Problem # 5:   ATRIAL FIBRILLATION (ICD-427.31) Atrial fibrillation appears well controlled with amiodarone.  Interrogation of her AICD would provide a more effective determination.  Appropriate monitoring laboratories included a hepatic profile, thyroid-stimulating hormone level and chest x-ray will be obtained.  Problem # 6:  Dermal depigmentation Ms. Heenan is concerned about the skin discoloration over her upper lip.  We will attempt to find a dermatologist who can deal with this issue.  Problem # 7:  TOBACCO ABUSE (ICD-305.1) Encouragement was provided with respect to the patient having stopped smoking, at least for brief periods of time.  She is encouraged to continue her quit attempts.  Problem # 8:  GOUT (ICD-274.9) Ms. Cuthrell has not been using the narcotics available to her for pain.  I encouraged her to do so, at least at night, when her pain is worse.  A uric acid and x-ray of  the right wrist will be obtained to be available to Dr. Margo Aye at his next office visit.  I will plan to see this nice woman again in 6 months.  Other Orders: T-TSH [phone]) T-CBC w/Diff [phone]) Hemoccult Cards (Take Home) (Hemoccult Cards) T- * Misc. Laboratory test [phone]) T-Wrist Comp Right (73110TC) T-Chest x-ray, 2 views (40102)  Patient Instructions: 1)  Your physician recommends that you schedule a follow-up appointment in: 6 MONTHS 2)  Your physician recommends that you return for lab work in: TODAY 3)  A chest x-ray takes a picture of the organs and structures inside the chest, including the heart, lungs, and blood vessels. This test can show several things, including, whether the heart is enlarged; whether fluid is building up in the lungs; and whether pacemaker / defibrillator leads are still in place. 4)  X RAY R WRIST ARTHRITIS

## 2010-02-23 NOTE — Medication Information (Signed)
Summary: ccr-lr  Anticoagulant Therapy  Managed by: Vashti Hey, RN PCP: Dr. Catalina Pizza Supervising MD: Dietrich Pates MD, Molly Maduro Indication 1: Atrial Fibrillation (ICD-427.31) Lab Used: [facility] [place] Site: [place] INR POC 3.6  Dietary changes: no    Health status changes: no    Bleeding/hemorrhagic complications: no    Recent/future hospitalizations: no    Any changes in medication regimen? no    Recent/future dental: no  Any missed doses?: no       Is patient compliant with meds? yes       Allergies: 1)  ! Penicillin  Anticoagulation Management History:      The patient is taking warfarin and comes in today for a routine follow up visit.  Positive risk factors for bleeding include an age of 25 years or older and presence of serious comorbidities.  The bleeding index is 'intermediate risk'.  Positive CHADS2 values include History of CHF and History of HTN.  Negative CHADS2 values include Age > 69 years old.  The start date was 06/02/2005.  Anticoagulation responsible provider: Dietrich Pates MD, Molly Maduro.  INR POC: 3.6.  Cuvette Lot#: [id number].  Exp: 04/12.    Anticoagulation Management Assessment/Plan:      The patient's current anticoagulation dose is Coumadin 5 mg tabs: as directed.  The target INR is 2 - 3.  The next INR is due 07/30/2009.  Anticoagulation instructions were given to patient.  Results were reviewed/authorized by Vashti Hey, RN.  She was notified by Vashti Hey RN.         Prior Anticoagulation Instructions: INR 1.8 Take coumadin 2 tablets tonight, take 1 1/2 tablets tomorrow night then resume 1 tablet once daily   Current Anticoagulation Instructions: INR 3.6 Hold coumadin tonight then resume 5mg  once daily

## 2010-02-23 NOTE — Letter (Signed)
Summary: Appointment - Missed  [facility] [place]  [address] [place] [address]   Phone: [phone]  Fax: [phone]     November 13, 2009 MRN: [id number]   [place] [address] [place], [place]  [address]   Dear Ms. Lahti,  Our records indicate you missed your appointment on       11/12/09 COUMADIN CLINIC                It is very important that we reach you to reschedule this appointment. We look forward to participating in your health care needs. Please contact us at the number listed above at your earliest convenience to reschedule this appointment.     Sincerely,    Glass blower/designer

## 2010-02-23 NOTE — Medication Information (Signed)
Summary: ccr-lr  Anticoagulant Therapy  Managed by: Vashti Hey, RN PCP: Dr. Catalina Pizza Supervising MD: Diona Browner MD, Remi Deter Indication 1: Atrial Fibrillation (ICD-427.31) Lab Used: [facility]  Site: [place] INR POC 5.5  Dietary changes: no    Health status changes: no    Bleeding/hemorrhagic complications: no    Recent/future hospitalizations: no    Any changes in medication regimen? no    Recent/future dental: no  Any missed doses?: no       Is patient compliant with meds? yes       Allergies: 1)  ! Penicillin  Anticoagulation Management History:      The patient is taking warfarin and comes in today for a routine follow up visit.  Positive risk factors for bleeding include an age of 68 years or older and presence of serious comorbidities.  The bleeding index is 'intermediate risk'.  Positive CHADS2 values include History of CHF and History of HTN.  Negative CHADS2 values include Age > 74 years old.  The start date was 06/02/2005.  Anticoagulation responsible provider: Diona Browner MD, Remi Deter.  INR POC: 5.5.  Cuvette Lot#: [id number].  Exp: 04/12.    Anticoagulation Management Assessment/Plan:      The patient's current anticoagulation dose is Coumadin 5 mg tabs: as directed.  The target INR is 2 - 3.  The next INR is due 08/13/2009.  Anticoagulation instructions were given to patient.  Results were reviewed/authorized by Vashti Hey, RN.  She was notified by Vashti Hey RN.         Prior Anticoagulation Instructions: INR 3.6 Hold coumadin tonight then resume 5mg  once daily   Current Anticoagulation Instructions: INR 5.5 Hold coumadin x 3 nights then decrease dose to 5mg  once daily except 2.5mg  on Mondays and Thursdays

## 2010-02-23 NOTE — Medication Information (Signed)
Summary: ccr-lr  Anticoagulant Therapy  Managed by: Vashti Hey, RN PCP: Dr. Catalina Pizza Supervising MD: Diona Browner MD, Remi Deter Indication 1: Atrial Fibrillation (ICD-427.31) Lab Used: [facility] [place] Site: [place] INR POC 1.7  Dietary changes: no    Health status changes: no    Bleeding/hemorrhagic complications: no    Recent/future hospitalizations: no    Any changes in medication regimen? no    Recent/future dental: no  Any missed doses?: no       Is patient compliant with meds? yes       Allergies: 1)  ! Penicillin  Anticoagulation Management History:      The patient is taking warfarin and comes in today for a routine follow up visit.  Positive risk factors for bleeding include an age of 51 years or older and presence of serious comorbidities.  The bleeding index is 'intermediate risk'.  Positive CHADS2 values include History of CHF and History of HTN.  Negative CHADS2 values include Age > 5 years old.  The start date was 06/02/2005.  Anticoagulation responsible provider: Diona Browner MD, Remi Deter.  INR POC: 1.7.  Cuvette Lot#: [id number].  Exp: 04/12.    Anticoagulation Management Assessment/Plan:      The patient's current anticoagulation dose is Coumadin 5 mg tabs: as directed.  The target INR is 2 - 3.  The next INR is due 05/28/2009.  Anticoagulation instructions were given to patient.  Results were reviewed/authorized by Vashti Hey, RN.  She was notified by Vashti Hey RN.         Prior Anticoagulation Instructions: INR 1.4 Take coumadin 1 1/2 tablets tonight then resume 1/2 tablet once daily except 1 tablet on Sundays, Tuesdays and Thursdays  Current Anticoagulation Instructions: INR 1.7 Increase coumadin to 5mg  once daily except 2.5mg  on Mondays and Fridays

## 2010-02-23 NOTE — Letter (Signed)
Summary: Appointment - Reminder 2  [facility] [address]   [place], [place] [address]   Phone: [phone]  Fax: [phone]     January 01, 2010 MRN: [id number]   [name] [name] [address] [place], [place]  [address]   Dear [name],  Our records indicate that it is time to schedule a follow-up appointment.  Dr. Ladona Ridgel        recommended that you follow up with [place] in   10.2011 PAST DUE         . It is very important that we reach you to schedule this appointment. We look forward to participating in your health care needs. Please contact us at the number listed above at your earliest convenience to schedule your appointment.  If you are unable to make an appointment at this time, give [place] a call so we can update our records.     Sincerely,   Glass blower/designer

## 2010-02-23 NOTE — Medication Information (Signed)
Summary: ccr-lr  Anticoagulant Therapy  Managed by: Vashti Hey, RN PCP: Dr. Catalina Pizza Supervising MD: Dietrich Pates MD, Molly Maduro Indication 1: Atrial Fibrillation (ICD-427.31) Lab Used: [facility] [place] Site: [place] INR POC 1.6  Dietary changes: no    Health status changes: no    Bleeding/hemorrhagic complications: no    Recent/future hospitalizations: no    Any changes in medication regimen? no    Recent/future dental: no  Any missed doses?: no       Is patient compliant with meds? yes       Allergies: 1)  ! Penicillin  Anticoagulation Management History:      The patient is taking warfarin and comes in today for a routine follow up visit.  Positive risk factors for bleeding include an age of 68 years or older and presence of serious comorbidities.  The bleeding index is 'intermediate risk'.  Positive CHADS2 values include History of CHF and History of HTN.  Negative CHADS2 values include Age > 36 years old.  The start date was 06/02/2005.  Anticoagulation responsible provider: Dietrich Pates MD, Molly Maduro.  INR POC: 1.6.  Cuvette Lot#: [id number].  Exp: 04/12.    Anticoagulation Management Assessment/Plan:      The patient's current anticoagulation dose is Coumadin 5 mg tabs: as directed.  The target INR is 2 - 3.  The next INR is due 06/10/2009.  Anticoagulation instructions were given to patient.  Results were reviewed/authorized by Vashti Hey, RN.  She was notified by Vashti Hey RN.         Prior Anticoagulation Instructions: INR 1.7 Increase coumadin to 5mg  once daily except 2.5mg  on Mondays and Fridays  Current Anticoagulation Instructions: INR 1.6 Take coumadin 1 1/2 tablets tonight (7.5mg ) then increase dose to 5mg  once daily (1 tablet).

## 2010-02-23 NOTE — Miscellaneous (Signed)
Summary: LABS CMP,12/06/2008  Clinical Lists Changes  Observations: Added new observation of CALCIUM: 9.0 mg/dL (45/40/9811 91:47) Added new observation of ALBUMIN: 3.9 g/dL (82/95/6213 08:65) Added new observation of PROTEIN, TOT: 7.4 g/dL (78/46/9629 52:84) Added new observation of SGPT (ALT): 12 units/L (12/06/2008 14:40) Added new observation of SGOT (AST): 17 units/L (12/06/2008 14:40) Added new observation of ALK PHOS: 98 units/L (12/06/2008 14:40) Added new observation of CREATININE: 1.88 mg/dL (13/24/4010 27:25) Added new observation of BUN: 34 mg/dL (36/64/4034 74:25) Added new observation of BG RANDOM: 72 mg/dL (95/63/8756 43:32) Added new observation of CO2 PLSM/SER: 26 meq/L (12/06/2008 14:40) Added new observation of CL SERUM: 105 meq/L (12/06/2008 14:40) Added new observation of K SERUM: 4.3 meq/L (12/06/2008 14:40) Added new observation of NA: 142 meq/L (12/06/2008 14:40)

## 2010-02-23 NOTE — Medication Information (Signed)
Summary: ccr-lr  Anticoagulant Therapy  Managed by: Vashti Hey, RN PCP: Dr. Catalina Pizza Supervising MD: Dietrich Pates MD, Molly Maduro Indication 1: Atrial Fibrillation (ICD-427.31) Lab Used: [facility] [place] Site: [place] INR POC 1.7  Dietary changes: no    Health status changes: no    Bleeding/hemorrhagic complications: no    Recent/future hospitalizations: no    Any changes in medication regimen? no    Recent/future dental: no  Any missed doses?: no       Is patient compliant with meds? yes       Allergies: 1)  ! Penicillin  Anticoagulation Management History:      The patient is taking warfarin and comes in today for a routine follow up visit.  Positive risk factors for bleeding include an age of 19 years or older and presence of serious comorbidities.  The bleeding index is 'intermediate risk'.  Positive CHADS2 values include History of CHF and History of HTN.  Negative CHADS2 values include Age > 49 years old.  The start date was 06/02/2005.  Anticoagulation responsible Vienne Corcoran: Dietrich Pates MD, Molly Maduro.  INR POC: 1.7.  Cuvette Lot#: [id number].  Exp: 04/12.    Anticoagulation Management Assessment/Plan:      The patient's current anticoagulation dose is Coumadin 5 mg tabs: as directed.  The target INR is 2 - 3.  The next INR is due 11/12/2009.  Anticoagulation instructions were given to patient.  Results were reviewed/authorized by Vashti Hey, RN.         Prior Anticoagulation Instructions: INR 2.5 Continue coumadin 5mg  once daily except 2.5mg  on Mondays and Thursdays  Current Anticoagulation Instructions: INR 1.7 Take coumadin 1 1/2 tablets tonight then resume 1 tablet once daily except 1/2 tablet on Mondays and Thursdays

## 2010-02-23 NOTE — Medication Information (Signed)
Summary: ccr-lr  Anticoagulant Therapy  Managed by: Vashti Hey, RN PCP: Dr. Catalina Pizza Supervising MD: Dietrich Pates MD, Molly Maduro Indication 1: Atrial Fibrillation (ICD-427.31) Lab Used: [facility] [place] Site: [place] INR POC 1.8  Dietary changes: no    Health status changes: no    Bleeding/hemorrhagic complications: no    Recent/future hospitalizations: no    Any changes in medication regimen? yes       Details: on uloric 40mg  qd for gout  Recent/future dental: no  Any missed doses?: no       Is patient compliant with meds? yes       Allergies: 1)  ! Penicillin  Anticoagulation Management History:      The patient is taking warfarin and comes in today for a routine follow up visit.  Positive risk factors for bleeding include an age of 61 years or older and presence of serious comorbidities.  The bleeding index is 'intermediate risk'.  Positive CHADS2 values include History of CHF and History of HTN.  Negative CHADS2 values include Age > 23 years old.  The start date was 06/02/2005.  Anticoagulation responsible provider: Dietrich Pates MD, Molly Maduro.  INR POC: 1.8.  Cuvette Lot#: [id number].  Exp: 04/12.    Anticoagulation Management Assessment/Plan:      The patient's current anticoagulation dose is Coumadin 5 mg tabs: as directed.  The target INR is 2 - 3.  The next INR is due 07/09/2009.  Anticoagulation instructions were given to patient.  Results were reviewed/authorized by Vashti Hey, RN.  She was notified by Vashti Hey RN.         Prior Anticoagulation Instructions: INR 2.7 Continue coumadin 5mg  once daily   Current Anticoagulation Instructions: INR 1.8 Take coumadin 2 tablets tonight, take 1 1/2 tablets tomorrow night then resume 1 tablet once daily

## 2010-02-23 NOTE — Letter (Signed)
Summary: Device-Delinquent Phone Transmission  [facility]  [address]   [place], [place] [address]   Phone: [phone]  Fax: [phone]     June 26, 2009 MRN: [id number]   [name] [name] [address] [place], [place]  [address]   Dear Ms. Kirschenbaum,  According to our records, you were scheduled for a device phone transmission on                              06/23/09.     We did not receive any results from this check.  If you transmitted on your scheduled day, please call us to help troubleshoot your system.  If you forgot to send your transmission, please send one upon receipt of this letter.  Thank you,  Milana Na, EMT-P  June 26, 2009 2:52 PM  [facility]

## 2010-02-23 NOTE — Medication Information (Signed)
Summary: ccr-lr  Anticoagulant Therapy  Managed by: Vashti Hey, RN PCP: Dr. Catalina Pizza Supervising MD: Diona Browner MD, Remi Deter Indication 1: Atrial Fibrillation (ICD-427.31) Lab Used: [facility] [place] Site: [place] INR POC 2.7  Dietary changes: no    Health status changes: no    Bleeding/hemorrhagic complications: no    Recent/future hospitalizations: no    Any changes in medication regimen? yes       Details: on uloric 40mg  qd   Recent/future dental: no  Any missed doses?: no       Is patient compliant with meds? yes       Allergies: 1)  ! Penicillin  Anticoagulation Management History:      The patient is taking warfarin and comes in today for a routine follow up visit.  Positive risk factors for bleeding include an age of 68 years or older and presence of serious comorbidities.  The bleeding index is 'intermediate risk'.  Positive CHADS2 values include History of CHF and History of HTN.  Negative CHADS2 values include Age > 68 years old.  The start date was 06/02/2005.  Anticoagulation responsible provider: Diona Browner MD, Remi Deter.  INR POC: 2.7.  Cuvette Lot#: [id number].  Exp: 04/12.    Anticoagulation Management Assessment/Plan:      The patient's current anticoagulation dose is Coumadin 5 mg tabs: as directed.  The target INR is 2 - 3.  The next INR is due 06/24/2009.  Anticoagulation instructions were given to patient.  Results were reviewed/authorized by Vashti Hey, RN.  She was notified by Vashti Hey RN.         Prior Anticoagulation Instructions: INR 1.6 Take coumadin 1 1/2 tablets tonight (7.5mg ) then increase dose to 5mg  once daily (1 tablet).  Current Anticoagulation Instructions: INR 2.7 Continue coumadin 5mg  once daily

## 2010-02-23 NOTE — Miscellaneous (Signed)
Summary: REFILL WARFARIN  Clinical Lists Changes  Medications: Rx of COUMADIN 5 MG TABS (WARFARIN SODIUM) as directed;  #60 x 3;  Signed;  Entered by: Teressa Lower RN;  Authorized by: Kathlen Brunswick, MD, [facility];  Method used: Electronically to [facility]*, [address] [place], [place], [place]  [address], Ph: [phone], Fax: [phone]    Prescriptions: COUMADIN 5 MG TABS (WARFARIN SODIUM) as directed  #60 x 3   Entered by:   Teressa Lower RN   Authorized by:   Kathlen Brunswick, MD, [facility]   Signed by:   Teressa Lower RN on 03/20/2009   Method used:   Electronically to        [facility]* (retail)       [address]       [place], [place]  [address]       Ph: [phone]       Fax: [phone]   RxID:   [id number]

## 2010-02-23 NOTE — Cardiovascular Report (Signed)
Summary: Office Visit Remote   Office Visit Remote   Imported By: Roderic Ovens 04/01/2009 11:06:26  _____________________________________________________________________  External Attachment:    Type:   Image     Comment:   External Document

## 2010-02-23 NOTE — Medication Information (Signed)
Summary: ccr-lr  Anticoagulant Therapy  Managed by: Vashti Hey, RN PCP: Dr. Catalina Pizza Supervising MD: Dietrich Pates MD, Molly Maduro Indication 1: Atrial Fibrillation (ICD-427.31) Lab Used: [facility] [place] Site: [place] INR POC 3.3  Dietary changes: no    Health status changes: no    Bleeding/hemorrhagic complications: no    Recent/future hospitalizations: no    Any changes in medication regimen? no    Recent/future dental: no  Any missed doses?: no       Is patient compliant with meds? yes       Allergies: 1)  ! Penicillin  Anticoagulation Management History:      The patient is taking warfarin and comes in today for a routine follow up visit.  Positive risk factors for bleeding include an age of 70 years or older and presence of serious comorbidities.  The bleeding index is 'intermediate risk'.  Positive CHADS2 values include History of CHF and History of HTN.  Negative CHADS2 values include Age > 17 years old.  The start date was 06/02/2005.  Anticoagulation responsible provider: Dietrich Pates MD, Molly Maduro.  INR POC: 3.3.  Cuvette Lot#: [id number].  Exp: 04/12.    Anticoagulation Management Assessment/Plan:      The patient's current anticoagulation dose is Coumadin 5 mg tabs: as directed.  The target INR is 2 - 3.  The next INR is due 01/11/2010.  Anticoagulation instructions were given to patient.  Results were reviewed/authorized by Vashti Hey, RN.  She was notified by Vashti Hey RN.         Prior Anticoagulation Instructions: INR 3.2 Continue coumadin 5mg  once daily except 2.5mg  on Mondays and Thursdays  Current Anticoagulation Instructions: INR 3.3 Decrease coumadin 5mg  once daily except 2.5mg  on Mondays, Wednesdays and Fridays

## 2010-02-23 NOTE — Letter (Signed)
Summary: Device-Delinquent Phone Journalist, newspaper, Main Office  [address]   [place], [place] [address]   Phone: [phone]  Fax: [phone]     February 24, 2009 MRN: [id number]   [place] Ghazi [address] [place], [place]  [address]   Dear Ms. Revuelta,  According to our records, you were scheduled for a device phone transmission on  February 10, 2009.     We did not receive any results from this check.  If you transmitted on your scheduled day, please call us to help troubleshoot your system.  If you forgot to send your transmission, please send one upon receipt of this letter.  Thank you,   Architectural technologist [facility] Clinic

## 2010-02-23 NOTE — Medication Information (Signed)
Summary: ccr-lr  Anticoagulant Therapy  Managed by: Vashti Hey, RN PCP: Tawni Carnes Supervising MD: Eden Emms MD, Theron Arista Indication 1: Atrial Fibrillation (ICD-427.31) Lab Used: [facility] [place] Site: [place] INR POC 3.1  Dietary changes: no    Health status changes: no    Bleeding/hemorrhagic complications: no    Recent/future hospitalizations: no    Any changes in medication regimen? no    Recent/future dental: no  Any missed doses?: no       Is patient compliant with meds? yes       Allergies: No Known Drug Allergies  Anticoagulation Management History:      The patient is taking warfarin and comes in today for a routine follow up visit.  Positive risk factors for bleeding include an age of 70 years or older and presence of serious comorbidities.  The bleeding index is 'intermediate risk'.  Positive CHADS2 values include History of CHF and History of HTN.  Negative CHADS2 values include Age > 26 years old.  The start date was 06/02/2005.  Anticoagulation responsible provider: Eden Emms MD, Theron Arista.  INR POC: 3.1.  Cuvette Lot#: [id number].  Exp: 10/11.    Anticoagulation Management Assessment/Plan:      The patient's current anticoagulation dose is Coumadin 5 mg tabs: as directed.  The target INR is 2 - 3.  The next INR is due 04/01/2009.  Anticoagulation instructions were given to patient.  Results were reviewed/authorized by Vashti Hey, RN.  She was notified by Vashti Hey RN.         Prior Anticoagulation Instructions: INR 1.4 Take coumadin 1 1/2 tablets today then resume 1/2 tablet once daily except 1 tablet on Tuesdays, Thursdays and Sundays  Current Anticoagulation Instructions: INR 3.1 Continue coumadin 2.5mg  once daily except 5mg  on Sundays, Tuesdays and Thursdays

## 2010-02-23 NOTE — Letter (Signed)
Summary: Appointment - Reminder 2  [facility] [place]. [address] [place] [address]   Phone: [phone]  Fax: [phone]     December 31, 2009 MRN: [id number]   [name] [name] [address] [place], [place]  [address]   Dear [name] [name],  Our records indicate that it is time to schedule a follow-up appointment.  Dr.   Dietrich Pates       recommended that you follow up with [place] in    09/2009 PAST DUE        . It is very important that we reach you to schedule this appointment. We look forward to participating in your health care needs. Please contact us at the number listed above at your earliest convenience to schedule your appointment.  If you are unable to make an appointment at this time, give [place] a call so we can update our records.     Sincerely,   Glass blower/designer

## 2010-02-23 NOTE — Medication Information (Signed)
Summary: ccr-lr  Anticoagulant Therapy  Managed by: Vashti Hey, RN PCP: Dr. Catalina Pizza Supervising MD: Dietrich Pates MD, Molly Maduro Indication 1: Atrial Fibrillation (ICD-427.31) Lab Used: [facility] [place] Site: [place] INR POC 2.5  Dietary changes: no    Health status changes: no    Bleeding/hemorrhagic complications: no    Recent/future hospitalizations: no    Any changes in medication regimen? no    Recent/future dental: no  Any missed doses?: no       Is patient compliant with meds? yes       Allergies: 1)  ! Penicillin  Anticoagulation Management History:      The patient is taking warfarin and comes in today for a routine follow up visit.  Positive risk factors for bleeding include an age of 50 years or older and presence of serious comorbidities.  The bleeding index is 'intermediate risk'.  Positive CHADS2 values include History of CHF and History of HTN.  Negative CHADS2 values include Age > 74 years old.  The start date was 06/02/2005.  Anticoagulation responsible Jema Deegan: Dietrich Pates MD, Molly Maduro.  INR POC: 2.5.  Cuvette Lot#: [id number].  Exp: 04/12.    Anticoagulation Management Assessment/Plan:      The patient's current anticoagulation dose is Coumadin 5 mg tabs: as directed.  The target INR is 2 - 3.  The next INR is due 10/22/2009.  Anticoagulation instructions were given to patient.  Results were reviewed/authorized by Vashti Hey, RN.  She was notified by Vashti Hey RN.         Prior Anticoagulation Instructions: INR 2.4 Continue coumadin 5mg  once daily except 2.5mg  on Mondays and Thursdays  Current Anticoagulation Instructions: INR 2.5 Continue coumadin 5mg  once daily except 2.5mg  on Mondays and Thursdays

## 2010-02-23 NOTE — Letter (Signed)
Summary: Appointment - Missed  [facility] [place]  [address] [place] [address]   Phone: [phone]  Fax: [phone]     July 30, 2009 MRN: [id number]   [place] Barse [address] [place], [place]  [address]   Dear Ms. Esh,  Our records indicate you missed your appointment on        07/30/09 COUMADIN CLINIC              It is very important that we reach you to reschedule this appointment. We look forward to participating in your health care needs. Please contact us at the number listed above at your earliest convenience to reschedule this appointment.     Sincerely,    Glass blower/designer

## 2010-02-23 NOTE — Letter (Signed)
Summary: Appointment - Missed  [facility] [place]  [address], [place] [address]   Phone: [phone]  Fax: [phone]     August 27, 2009 MRN: [id number]   [name] [name] [address] [place], [place]  [address]   Dear [name],  Our records indicate you missed your appointment on          08/27/09 COUMADIN CLINIC             It is very important that we reach you to reschedule this appointment. We look forward to participating in your health care needs. Please contact us at the number listed above at your earliest convenience to reschedule this appointment.     Sincerely,    Glass blower/designer

## 2010-02-23 NOTE — Medication Information (Signed)
Summary: protime/tg  Anticoagulant Therapy  Managed by: Vashti Hey, RN PCP: Tawni Carnes Supervising MD: Daleen Squibb MD, Maisie Fus Indication 1: Atrial Fibrillation (ICD-427.31) Lab Used: [facility] [place] Site: [place] INR POC 2.4  Dietary changes: no    Health status changes: no    Bleeding/hemorrhagic complications: no    Recent/future hospitalizations: no    Any changes in medication regimen? no    Recent/future dental: no  Any missed doses?: no       Is patient compliant with meds? yes       Allergies: No Known Drug Allergies  Anticoagulation Management History:      The patient is taking warfarin and comes in today for a routine follow up visit.  Positive risk factors for bleeding include an age of 68 years or older and presence of serious comorbidities.  The bleeding index is 'intermediate risk'.  Positive CHADS2 values include History of CHF and History of HTN.  Negative CHADS2 values include Age > 68 years old.  The start date was 06/02/2005.  Anticoagulation responsible provider: Daleen Squibb MD, Maisie Fus.  INR POC: 2.4.  Cuvette Lot#: [id number].  Exp: 10/11.    Anticoagulation Management Assessment/Plan:      The patient's current anticoagulation dose is Coumadin 5 mg tabs: as directed.  The target INR is 2 - 3.  The next INR is due 02/26/2009.  Anticoagulation instructions were given to patient.  Results were reviewed/authorized by Vashti Hey, RN.  She was notified by Vashti Hey RN.         Prior Anticoagulation Instructions: INR 3.0 Continue coumadin 2.5mg  once daily except 5mg  on Sundays, Tuesdays and Thursdays Increase greens to 2 x week  Current Anticoagulation Instructions: INR 2.4 Continue coumadin 2.5mg  once daily except 5mg  on Sundays, Tuesdays and Thursdays

## 2010-02-23 NOTE — Medication Information (Signed)
Summary: protime per checkout on 03/31/09/tg  Anticoagulant Therapy  Managed by: Vashti Hey, RN PCP: Dr. Catalina Pizza Supervising MD: Dietrich Pates MD, Molly Maduro Indication 1: Atrial Fibrillation (ICD-427.31) Lab Used: [facility] [place] Site: [place] INR POC 1.4  Dietary changes: no    Health status changes: no    Bleeding/hemorrhagic complications: no    Recent/future hospitalizations: no    Any changes in medication regimen? no    Recent/future dental: no  Any missed doses?: no       Is patient compliant with meds? yes       Allergies: 1)  ! Penicillin  Anticoagulation Management History:      The patient is taking warfarin and comes in today for a routine follow up visit.  Positive risk factors for bleeding include an age of 18 years or older and presence of serious comorbidities.  The bleeding index is 'intermediate risk'.  Positive CHADS2 values include History of CHF and History of HTN.  Negative CHADS2 values include Age > 41 years old.  The start date was 06/02/2005.  Anticoagulation responsible provider: Dietrich Pates MD, Molly Maduro.  INR POC: 1.4.  Cuvette Lot#: [id number].  Exp: 04/12.    Anticoagulation Management Assessment/Plan:      The patient's current anticoagulation dose is Coumadin 5 mg tabs: as directed.  The target INR is 2 - 3.  The next INR is due 05/13/2009.  Anticoagulation instructions were given to patient.  Results were reviewed/authorized by Vashti Hey, RN.  She was notified by Vashti Hey RN.         Prior Anticoagulation Instructions: INR 2.6 TODAY  CONTINUE CURRENT DOSE REGIMEN 1/2 TABLET M-W-F-SAT 1 TABLET SUN, T-TH  Current Anticoagulation Instructions: INR 1.4 Take coumadin 1 1/2 tablets tonight then resume 1/2 tablet once daily except 1 tablet on Sundays, Tuesdays and Thursdays

## 2010-02-23 NOTE — Letter (Signed)
Summary: Remote Device Check  [employer], Main Office  [address]   [place], [place] [address]   Phone: [phone]  Fax: [phone]     March 31, 2009 MRN: [id number]   [place] Whitehorn [address] [place], [place]  [address]   Dear Ms. Bilodeau,   Your remote transmission was recieved and reviewed by your physician.  All diagnostics were within normal limits for you.  __X___Your next transmission is scheduled for:  Jun 23, 2009.  Please transmit at any time this day.  If you have a wireless device your transmission will be sent automatically.     Sincerely,  Proofreader

## 2010-02-23 NOTE — Medication Information (Signed)
Summary: CCR  Anticoagulant Therapy  Managed by: Vashti Hey, RN PCP: Dr. Catalina Pizza Supervising MD: Dietrich Pates MD, Molly Maduro Indication 1: Atrial Fibrillation (ICD-427.31) Lab Used: [facility]  Site: [place] INR POC 2.4  Dietary changes: no    Health status changes: no    Bleeding/hemorrhagic complications: no    Recent/future hospitalizations: no    Any changes in medication regimen? no    Recent/future dental: no  Any missed doses?: no       Is patient compliant with meds? yes       Allergies: 1)  ! Penicillin  Anticoagulation Management History:      The patient is taking warfarin and comes in today for a routine follow up visit.  Positive risk factors for bleeding include an age of 39 years or older and presence of serious comorbidities.  The bleeding index is 'intermediate risk'.  Positive CHADS2 values include History of CHF and History of HTN.  Negative CHADS2 values include Age > 46 years old.  The start date was 06/02/2005.  Anticoagulation responsible provider: Dietrich Pates MD, Molly Maduro.  INR POC: 2.4.  Cuvette Lot#: [id number].  Exp: 04/12.    Anticoagulation Management Assessment/Plan:      The patient's current anticoagulation dose is Coumadin 5 mg tabs: as directed.  The target INR is 2 - 3.  The next INR is due 09/24/2009.  Anticoagulation instructions were given to patient.  Results were reviewed/authorized by Vashti Hey, RN.  She was notified by Vashti Hey RN.         Prior Anticoagulation Instructions: INR 2.0 Continue coumadin 5mg  once daily except 2.5mg  on Mondays and Thursdays  Current Anticoagulation Instructions: INR 2.4 Continue coumadin 5mg  once daily except 2.5mg  on Mondays and Thursdays

## 2010-02-23 NOTE — Miscellaneous (Signed)
Summary: stool cards 04/23/2009  Clinical Lists Changes  Observations: Added new observation of HEMOCCULT 3: neg (04/17/2009 16:44) Added new observation of HEMOCCULT 2: neg (04/17/2009 16:44) Added new observation of HEMOCCULT 1: neg (04/17/2009 16:44)

## 2010-02-23 NOTE — Medication Information (Signed)
Summary: ccr-lr  Anticoagulant Therapy  Managed by: Vashti Hey, RN PCP: Tawni Carnes Supervising MD: Dietrich Pates MD, Molly Maduro Indication 1: Atrial Fibrillation (ICD-427.31) Lab Used: [facility] [place] Site: [place] INR POC 1.4  Dietary changes: no    Health status changes: no    Bleeding/hemorrhagic complications: no    Recent/future hospitalizations: no    Any changes in medication regimen? no    Recent/future dental: no  Any missed doses?: no       Is patient compliant with meds? yes      Comments: Pt denies missing doses  Allergies: No Known Drug Allergies  Anticoagulation Management History:      The patient is taking warfarin and comes in today for a routine follow up visit.  Positive risk factors for bleeding include an age of 68 years or older and presence of serious comorbidities.  The bleeding index is 'intermediate risk'.  Positive CHADS2 values include History of CHF and History of HTN.  Negative CHADS2 values include Age > 77 years old.  The start date was 06/02/2005.  Anticoagulation responsible provider: Dietrich Pates MD, Molly Maduro.  INR POC: 1.4.  Cuvette Lot#: [id number].  Exp: 10/11.    Anticoagulation Management Assessment/Plan:      The patient's current anticoagulation dose is Coumadin 5 mg tabs: as directed.  The target INR is 2 - 3.  The next INR is due 03/11/2009.  Anticoagulation instructions were given to patient.  Results were reviewed/authorized by Vashti Hey, RN.  She was notified by Vashti Hey RN.         Prior Anticoagulation Instructions: INR 2.4 Continue coumadin 2.5mg  once daily except 5mg  on Sundays, Tuesdays and Thursdays  Current Anticoagulation Instructions: INR 1.4 Take coumadin 1 1/2 tablets today then resume 1/2 tablet once daily except 1 tablet on Tuesdays, Thursdays and Sundays

## 2010-02-23 NOTE — Letter (Signed)
Summary: Device-Delinquent Check  [facility], Main Office  [address]   [place], [place] [address]   Phone: [phone]  Fax: [phone]     December 10, 2009 MRN: [id number]   [place] Barsanti [address] [place], [place]  [address]   Dear Ms. Bezdek,  According to our records, you have not had your implanted device checked in the recommended period of time.  We are unable to determine appropriate device function without checking your device on a regular basis.  Please call our office to schedule an appointment , with Dr Graciela Husbands ,as soon as possible.  If you are having your device checked by another physician, please call us so that we may update our records.  Thank you,  Letta Moynahan, EMT  December 10, 2009 2:26 PM  [facility]

## 2010-02-23 NOTE — Medication Information (Signed)
Summary: ccr-lr  Anticoagulant Therapy  Managed by: Teressa Lower, RN PCP: Tawni Carnes Supervising MD: Eden Emms MD, Theron Arista Indication 1: Atrial Fibrillation (ICD-427.31) Lab Used: [facility] [place] Site: [place] INR POC 2.6  Dietary changes: no    Health status changes: no    Bleeding/hemorrhagic complications: no    Recent/future hospitalizations: no    Any changes in medication regimen? no    Recent/future dental: no  Any missed doses?: no       Is patient compliant with meds? yes       Current Medications (verified): 1)  Amiodarone Hcl 200 Mg Tabs (Amiodarone Hcl) .... Take 1 Tablet Daily 2)  Coumadin 5 Mg Tabs (Warfarin Sodium) .... As Directed 3)  Enalapril Maleate 10 Mg Tabs (Enalapril Maleate) .Marland Kitchen.. 1 Tab Two Times A Day 4)  Spironolactone 25 Mg Tabs (Spironolactone) .Marland Kitchen.. 1 Tab Once Daily 5)  Lanoxin 0.125 Mg Tabs (Digoxin) .... Take One Tablet By Mouth Every Other Day 6)  Furosemide 40 Mg Tabs (Furosemide) .... Take 1 Tablet By Mouth Two Times A Day 7)  Tylenol Extra Strength 500 Mg Tabs (Acetaminophen) .... As Needed 8)  Colcrys 0.6 Mg Tabs (Colchicine) .... Take 1 Tablet By Mouth Once A Day 9)  Hydrocodone-Acetaminophen 5-500 Mg Tabs (Hydrocodone-Acetaminophen) .... As Needed 10)  Carvedilol 25 Mg Tabs (Carvedilol) .... Take 1 Tablet By Mouth Two Times A Day  Allergies (verified): No Known Drug Allergies  Anticoagulation Management History:      The patient is taking warfarin and comes in today for a routine follow up visit.  Positive risk factors for bleeding include an age of 25 years or older and presence of serious comorbidities.  The bleeding index is 'intermediate risk'.  Positive CHADS2 values include History of CHF and History of HTN.  Negative CHADS2 values include Age > 36 years old.  The start date was 06/02/2005.  Anticoagulation responsible provider: Eden Emms MD, Theron Arista.  INR POC: 2.6.  Cuvette Lot#: [id number].  Exp:  04/12.    Anticoagulation Management Assessment/Plan:      The patient's current anticoagulation dose is Coumadin 5 mg tabs: as directed.  The target INR is 2 - 3.  The next INR is due 04/27/2009.  Anticoagulation instructions were given to patient.  Results were reviewed/authorized by Teressa Lower, RN.  She was notified by Teressa Lower RN.         Prior Anticoagulation Instructions: INR 3.1 Continue coumadin 2.5mg  once daily except 5mg  on Sundays, Tuesdays and Thursdays  Current Anticoagulation Instructions: INR 2.6 TODAY  CONTINUE CURRENT DOSE REGIMEN 1/2 TABLET M-W-F-SAT 1 TABLET SUN, T-TH

## 2010-02-23 NOTE — Medication Information (Signed)
Summary: ccr  Anticoagulant Therapy  Managed by: Vashti Hey, RN PCP: Dr. Catalina Pizza Supervising MD: Daleen Squibb MD, Maisie Fus Indication 1: Atrial Fibrillation (ICD-427.31) Lab Used: [facility] [place] Site: [place] INR POC 3.2  Dietary changes: no    Health status changes: no    Bleeding/hemorrhagic complications: no    Recent/future hospitalizations: no    Any changes in medication regimen? no    Recent/future dental: no  Any missed doses?: no       Is patient compliant with meds? yes       Allergies: 1)  ! Penicillin  Anticoagulation Management History:      The patient is taking warfarin and comes in today for a routine follow up visit.  Positive risk factors for bleeding include an age of 68 years or older and presence of serious comorbidities.  The bleeding index is 'intermediate risk'.  Positive CHADS2 values include History of CHF and History of HTN.  Negative CHADS2 values include Age > 26 years old.  The start date was 06/02/2005.  Anticoagulation responsible provider: Daleen Squibb MD, Maisie Fus.  INR POC: 3.2.  Cuvette Lot#: [id number].  Exp: 04/12.    Anticoagulation Management Assessment/Plan:      The patient's current anticoagulation dose is Coumadin 5 mg tabs: as directed.  The target INR is 2 - 3.  The next INR is due 12/14/2009.  Anticoagulation instructions were given to patient.  Results were reviewed/authorized by Vashti Hey, RN.  She was notified by Vashti Hey RN.         Prior Anticoagulation Instructions: INR 1.7 Take coumadin 1 1/2 tablets tonight then resume 1 tablet once daily except 1/2 tablet on Mondays and Thursdays  Current Anticoagulation Instructions: INR 3.2 Continue coumadin 5mg  once daily except 2.5mg  on Mondays and Thursdays

## 2010-02-25 NOTE — Cardiovascular Report (Signed)
Summary: Certified Letter Signed - Other (not doing f/u)  Certified Letter Signed - Other (not doing f/u)   Imported By: Debby Freiberg 02/10/2010 16:56:49  _____________________________________________________________________  External Attachment:    Type:   Image     Comment:   External Document

## 2010-02-25 NOTE — Medication Information (Signed)
Summary: ccr-lr  Anticoagulant Therapy  Managed by: Vashti Hey, RN Referring MD: Dietrich Pates PCP: Dr. Catalina Pizza Supervising MD: Diona Browner MD, Remi Deter Indication 1: Atrial Fibrillation (ICD-427.31) Lab Used: [facility] [place] Site: [place] INR POC 1.9  Dietary changes: no    Health status changes: no    Bleeding/hemorrhagic complications: no    Recent/future hospitalizations: no    Any changes in medication regimen? no    Recent/future dental: no  Any missed doses?: no       Is patient compliant with meds? yes       Allergies: 1)  ! Penicillin  Anticoagulation Management History:      The patient is taking warfarin and comes in today for a routine follow up visit.  Positive risk factors for bleeding include an age of 40 years or older and presence of serious comorbidities.  The bleeding index is 'intermediate risk'.  Positive CHADS2 values include History of CHF and History of HTN.  Negative CHADS2 values include Age > 44 years old.  The start date was 06/02/2005.  Anticoagulation responsible provider: Diona Browner MD, Remi Deter.  INR POC: 1.9.  Cuvette Lot#: [id number].  Exp: 04/12.    Anticoagulation Management Assessment/Plan:      The patient's current anticoagulation dose is Coumadin 5 mg tabs: as directed.  The target INR is 2 - 3.  The next INR is due 03/03/2010.  Anticoagulation instructions were given to patient.  Results were reviewed/authorized by Vashti Hey, RN.  She was notified by Vashti Hey RN.         Prior Anticoagulation Instructions: INR 1.8 Take coumadin 1 tablet tonight then increase dose to 1 tablet once daily except 1/2 tablet on Mondays and Fridays  Current Anticoagulation Instructions: INR 1.9 Increase coumadin to 5mg  once daily except 2.5mg  on Fridays

## 2010-02-25 NOTE — Medication Information (Signed)
Summary: ccr-lr  Anticoagulant Therapy  Managed by: Vashti Hey, RN PCP: Dr. Catalina Pizza Supervising MD: Dietrich Pates MD, Molly Maduro Indication 1: Atrial Fibrillation (ICD-427.31) Lab Used: [facility] [place] Site: [place] INR POC 1.8  Dietary changes: no    Health status changes: no    Bleeding/hemorrhagic complications: no    Recent/future hospitalizations: no    Any changes in medication regimen? no    Recent/future dental: no  Any missed doses?: no       Is patient compliant with meds? yes       Allergies: 1)  ! Penicillin  Anticoagulation Management History:      The patient is taking warfarin and comes in today for a routine follow up visit.  Positive risk factors for bleeding include an age of 70 years or older and presence of serious comorbidities.  The bleeding index is 'intermediate risk'.  Positive CHADS2 values include History of CHF and History of HTN.  Negative CHADS2 values include Age > 15 years old.  The start date was 06/02/2005.  Anticoagulation responsible provider: Dietrich Pates MD, Molly Maduro.  INR POC: 1.8.  Cuvette Lot#: [id number].  Exp: 04/12.    Anticoagulation Management Assessment/Plan:      The patient's current anticoagulation dose is Coumadin 5 mg tabs: as directed.  The target INR is 2 - 3.  The next INR is due 02/08/2010.  Anticoagulation instructions were given to patient.  Results were reviewed/authorized by Vashti Hey, RN.  She was notified by Vashti Hey RN.         Prior Anticoagulation Instructions: INR 3.3 Decrease coumadin 5mg  once daily except 2.5mg  on Mondays, Wednesdays and Fridays  Current Anticoagulation Instructions: INR 1.8 Take coumadin 1 tablet tonight then increase dose to 1 tablet once daily except 1/2 tablet on Mondays and Fridays

## 2010-02-25 NOTE — Letter (Signed)
Summary: Device-Delinquent Check   [facility], Main Office  [address]   [place], [place] [address]   Phone: [phone]  Fax: [phone]     January 20, 2010 MRN: [id number]   [name] [name] [address] [place], [place]  [address]   Dear Ms. Dunkel,  According to our records, you have not had your implanted device checked in the recommended period of time.  We are unable to determine appropriate device function without checking your device on a regular basis.  Please call our office to schedule an appointment as soon as possible, with Dr. Ladona Ridgel in [place].    If you are having your device checked by another physician, please call us so that we may update our records.  Thank you,  Altha Harm, LPN  January 20, 2010 7:14 AM  [facility]  certified mail

## 2010-03-04 ENCOUNTER — Encounter (INDEPENDENT_AMBULATORY_CARE_PROVIDER_SITE_OTHER): Payer: Self-pay | Admitting: *Deleted

## 2010-03-11 NOTE — Letter (Signed)
Summary: Appointment - Missed  [facility] [place]  [address] [place] [address]   Phone: [phone]  Fax: [phone]     March 04, 2010 MRN: [id number]   [place] [address] [place], [place]  [address]   Dear Ms. Penson,  Our records indicate you missed your appointment on  03/03/10                      with Coumadin clinic  .                                    It is very important that we reach you to reschedule this appointment. We look forward to participating in your health care needs. Please contact us at the number listed above at your earliest convenience to reschedule this appointment.     Sincerely,    Glass blower/designer

## 2010-03-17 ENCOUNTER — Encounter: Payer: Self-pay | Admitting: Cardiology

## 2010-03-17 ENCOUNTER — Encounter (INDEPENDENT_AMBULATORY_CARE_PROVIDER_SITE_OTHER): Payer: Medicare Other

## 2010-03-17 DIAGNOSIS — Z7901 Long term (current) use of anticoagulants: Secondary | ICD-10-CM

## 2010-03-17 DIAGNOSIS — I4891 Unspecified atrial fibrillation: Secondary | ICD-10-CM

## 2010-03-23 NOTE — Medication Information (Signed)
Summary: ccr-lr [place]  Anticoagulant Therapy  Managed by: Vashti Hey, RN Referring MD: Dietrich Pates PCP: Dr. Catalina Pizza Supervising MD: Diona Browner MD, Remi Deter Indication 1: Atrial Fibrillation (ICD-427.31) Lab Used: [facility] [place] Site: [place] INR POC 3.3  Dietary changes: no    Health status changes: no    Bleeding/hemorrhagic complications: no    Recent/future hospitalizations: no    Any changes in medication regimen? no    Recent/future dental: no  Any missed doses?: no       Is patient compliant with meds? yes       Allergies: 1)  ! Penicillin  Anticoagulation Management History:      The patient is taking warfarin and comes in today for a routine follow up visit.  Positive risk factors for bleeding include an age of 70 years or older and presence of serious comorbidities.  The bleeding index is 'intermediate risk'.  Positive CHADS2 values include History of CHF and History of HTN.  Negative CHADS2 values include Age > 72 years old.  The start date was 06/02/2005.  Anticoagulation responsible provider: Diona Browner MD, Remi Deter.  INR POC: 3.3.  Cuvette Lot#: [id number].  Exp: 04/12.    Anticoagulation Management Assessment/Plan:      The patient's current anticoagulation dose is Coumadin 5 mg tabs: as directed.  The target INR is 2 - 3.  The next INR is due 04/07/2010.  Anticoagulation instructions were given to patient.  Results were reviewed/authorized by Vashti Hey, RN.  She was notified by Vashti Hey RN.         Prior Anticoagulation Instructions: INR 1.9 Increase coumadin to 5mg  once daily except 2.5mg  on Fridays  Current Anticoagulation Instructions: INR 3.3 Take coumadin 1/2 tablet tonight then resume 1 tablet once daily except 1/2 tablet on Fridays

## 2010-04-01 ENCOUNTER — Telehealth (INDEPENDENT_AMBULATORY_CARE_PROVIDER_SITE_OTHER): Payer: Self-pay

## 2010-04-05 ENCOUNTER — Encounter: Payer: Self-pay | Admitting: Cardiology

## 2010-04-06 ENCOUNTER — Encounter: Payer: Self-pay | Admitting: Cardiology

## 2010-04-06 NOTE — Progress Notes (Signed)
Summary: refills  Phone Note Outgoing Call   Call placed by: Larita Fife Via LPN,  April 01, 2010 10:25 AM Summary of Call: [facility]. Pt. is 6 months overdue (has not been seen in 1 year) for follow up with Dr. Dietrich Pates and needs refill on Warfarin.  Initial call taken by: Larita Fife Via LPN,  April 01, 2010 10:27 AM  Follow-up for Phone Call        I spoke with Mrs. Ruppel, she is under the impression that she will be seeing Dr. Graciela Husbands for all of her cardiac needs.  She has an appt with him on 04/20/10.  Asked her to confirm this with him at her appt and call and schedule an appt if this is not the case. Follow-up by: Teressa Lower RN,  April 02, 2010 9:49 AM

## 2010-04-07 ENCOUNTER — Encounter: Payer: Self-pay | Admitting: Cardiology

## 2010-04-07 ENCOUNTER — Encounter (INDEPENDENT_AMBULATORY_CARE_PROVIDER_SITE_OTHER): Payer: Medicare Other

## 2010-04-07 DIAGNOSIS — I4891 Unspecified atrial fibrillation: Secondary | ICD-10-CM

## 2010-04-07 DIAGNOSIS — Z7901 Long term (current) use of anticoagulants: Secondary | ICD-10-CM

## 2010-04-07 LAB — CONVERTED CEMR LAB: POC INR: 2

## 2010-04-13 NOTE — Miscellaneous (Signed)
Summary: warfarin rx  Clinical Lists Changes  Medications: Changed medication from COUMADIN 5 MG TABS (WARFARIN SODIUM) as directed to COUMADIN 5 MG TABS (WARFARIN SODIUM) 1/2 tablet tonight then resume 1 tablet once daily except 1/2 tablet on Fridays or as directed by anticoagulation clinic - Signed Rx of COUMADIN 5 MG TABS (WARFARIN SODIUM) 1/2 tablet tonight then resume 1 tablet once daily except 1/2 tablet on Fridays or as directed by anticoagulation clinic;  #60 x 0;  Signed;  Entered by: Teressa Lower RN;  Authorized by: Kathlen Brunswick, MD, [facility];  Method used: Electronically to [facility]*, [address] [place], [place], [place]  [address], Ph: [phone], Fax: [phone]    Prescriptions: COUMADIN 5 MG TABS (WARFARIN SODIUM) 1/2 tablet tonight then resume 1 tablet once daily except 1/2 tablet on Fridays or as directed by anticoagulation clinic  #60 x 0   Entered by:   Teressa Lower RN   Authorized by:   Kathlen Brunswick, MD, [facility]   Signed by:   Teressa Lower RN on 04/05/2010   Method used:   Electronically to        [facility]* (retail)       [address]       [place], [place]  [address]       Ph: [phone]       Fax: [phone]   RxID:   [id number]

## 2010-04-13 NOTE — Medication Information (Signed)
Summary: ccr-lr  Anticoagulant Therapy  Managed by: Vashti Hey, RN Referring MD: Dietrich Pates PCP: Dr. Catalina Pizza Supervising MD: Daleen Squibb MD, Maisie Fus Indication 1: Atrial Fibrillation (ICD-427.31) Lab Used: [facility] [place] Site: [place] INR POC 2.0  Dietary changes: no    Health status changes: no    Bleeding/hemorrhagic complications: no    Recent/future hospitalizations: no    Any changes in medication regimen? no    Recent/future dental: no  Any missed doses?: no       Is patient compliant with meds? yes       Allergies: 1)  ! Penicillin  Anticoagulation Management History:      The patient is taking warfarin and comes in today for a routine follow up visit.  Positive risk factors for bleeding include an age of 68 years or older and presence of serious comorbidities.  The bleeding index is 'intermediate risk'.  Positive CHADS2 values include History of CHF and History of HTN.  Negative CHADS2 values include Age > 25 years old.  The start date was 06/02/2005.  Anticoagulation responsible provider: Daleen Squibb MD, Maisie Fus.  INR POC: 2.0.  Cuvette Lot#: [id number].  Exp: 04/12.    Anticoagulation Management Assessment/Plan:      The patient's current anticoagulation dose is Coumadin 5 mg tabs: 1/2 tablet tonight then resume 1 tablet once daily except 1/2 tablet on Fridays or as directed by anticoagulation clinic.  The target INR is 2 - 3.  The next INR is due 05/05/2010.  Anticoagulation instructions were given to patient.  Results were reviewed/authorized by Vashti Hey, RN.  She was notified by Vashti Hey RN.         Prior Anticoagulation Instructions: INR 3.3 Take coumadin 1/2 tablet tonight then resume 1 tablet once daily except 1/2 tablet on Fridays  Current Anticoagulation Instructions: INR 2.0 Take coumadin 1 1/2 tablets today then resume 1 tablet once daily except 1/2 tablet on Fridays

## 2010-04-15 ENCOUNTER — Other Ambulatory Visit: Payer: Self-pay | Admitting: *Deleted

## 2010-04-15 DIAGNOSIS — I10 Essential (primary) hypertension: Secondary | ICD-10-CM

## 2010-04-15 MED ORDER — CARVEDILOL 25 MG PO TABS: 25.0000 mg | ORAL_TABLET | Freq: Two times a day (BID) | ORAL | Status: DC

## 2010-04-20 ENCOUNTER — Encounter: Payer: Self-pay | Admitting: Internal Medicine

## 2010-04-23 ENCOUNTER — Encounter: Payer: Self-pay | Admitting: Cardiology

## 2010-04-23 DIAGNOSIS — I4891 Unspecified atrial fibrillation: Secondary | ICD-10-CM

## 2010-04-24 ENCOUNTER — Other Ambulatory Visit: Payer: Self-pay | Admitting: Cardiology

## 2010-04-28 ENCOUNTER — Encounter: Payer: Self-pay | Admitting: Adult Health

## 2010-04-28 ENCOUNTER — Ambulatory Visit: Payer: Medicare Other | Admitting: Cardiology

## 2010-04-28 ENCOUNTER — Ambulatory Visit (INDEPENDENT_AMBULATORY_CARE_PROVIDER_SITE_OTHER): Payer: Medicare Other | Admitting: Adult Health

## 2010-04-28 DIAGNOSIS — I5022 Chronic systolic (congestive) heart failure: Secondary | ICD-10-CM

## 2010-04-28 DIAGNOSIS — I4891 Unspecified atrial fibrillation: Secondary | ICD-10-CM

## 2010-04-28 NOTE — Assessment & Plan Note (Signed)
She is currently without complaint and appears euvolemic. Weight is down 4 pounds since last visit.  She is asymptomatic. Will continue her on current medications.  She is to have labs drawn on next visit, to include dig level, BMET.  She is followed by Dr. Margo Aye for these labs.  We will request copy.

## 2010-04-28 NOTE — Patient Instructions (Signed)
Your physician recommends that you schedule a follow-up appointment in: 6 months  Your physician recommends that you continue on your current medications as directed. Please refer to the Current Medication list given to you today.  

## 2010-04-28 NOTE — Assessment & Plan Note (Signed)
Rate is well controlled on current doses.    She is to continue with coumadin clinic for dosing.

## 2010-04-28 NOTE — Progress Notes (Signed)
Patient ID: [name] [name], female    DOB: 06-30-1940, 70 y.o.   MRN: [id number]  HPI [name] [name] is here on follow-up after being seen one year ago. She has a history of NICM with EF of 25%, catheterization in 03/07 50%, diagonal 1, with luminal irregularities in other vessels, s/p AICD with biv pacing in 2007 and is followed by Dr. Ladona Ridgel here in [place], Atrial fib followed in our coumadin clinic. Gout and OSA.  She is without complaint today and has become very active at home.  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  She states she gets bored sitting around at home and has to be doing something.  She is medically compliant and has had no ICD shocks, chest pain, DOE or symptoms whatsoever.  She is followed closely by Dr. Margo Aye as well.   Review of Systems Review of systems complete and found to be negative unless listed above Reviewed PMH, Medications, for updates.  Physical Exam   General: Well developed, well nourished, in no acute distress Head: Eyes PERRLA, No xanthomas.   Normal cephalic and atramatic  Lungs: Clear bilaterally to auscultation and percussion. Heart: HRRR S1 S2,distant.  Pulses are 2+ & equal.            No carotid bruit. No JVD.  No abdominal bruits. No femoral bruits. Abdomen: Bowel sounds are positive, abdomen soft and non-tender without masses or                  Hernia's noted. Msk:  Back normal, normal gait. Normal strength and tone for age. Extremities: No clubbing, cyanosis or edema.  DP +1 Neuro: Alert and oriented X 3. Psych:  Good affect, responds appropriately  Current outpatient prescriptions:acetaminophen (TYLENOL) 500 MG tablet, Take 500 mg by mouth as needed.  , Disp: , Rfl: ;  ALDACTONE 25 MG tablet, TAKE ONE TABLET BY MOUTH ONCE DAILY., Disp: 30 each, Rfl: 0;  amiodarone (PACERONE) 200 MG tablet, Take 200 mg by mouth daily.  , Disp: , Rfl: ;  buPROPion  (WELLBUTRIN SR) 100 MG 12 hr tablet, Take 100 mg by mouth daily.  , Disp: , Rfl:  carvedilol (COREG) 25 MG tablet, Take 1 tablet (25 mg total) by mouth 2 (two) times daily with a meal., Disp: 60 tablet, Rfl: 3;  digoxin (LANOXIN) 0.125 MG tablet, Take 0.125 mg by mouth every other day.  , Disp: , Rfl: ;  enalapril (VASOTEC) 10 MG tablet, Take 10 mg by mouth 2 (two) times daily.  , Disp: , Rfl: ;  HYDROcodone-acetaminophen (VICODIN) 5-500 MG per tablet, Take 1 tablet by mouth as needed.  , Disp: , Rfl:  LASIX 40 MG tablet, TAKE 1 TABLET BY MOUTH   TWICE A DAY., Disp: 60 each, Rfl: 0;  prednisoLONE acetate (PRED FORTE) 1 % ophthalmic suspension, 1 drop as needed.  , Disp: , Rfl: ;  warfarin (COUMADIN) 5 MG tablet, Take by mouth as directed. , Disp: , Rfl: ;  colchicine 0.6 MG tablet, Take 0.6 mg by mouth daily.  , Disp: , Rfl:   Allergies  Allergen Reactions  . Penicillins    Past Medical History  Diagnosis Date  . Nonischemic cardiomyopathy 1998  . Pulmonary HTN April 2009  . HTN (hypertension)   . Atrial fibrillation   . Nonsustained ventricular tachycardia   . OSA (obstructive sleep apnea)   .  CKD (chronic kidney disease)   . Venous stasis   . Gout    .instc

## 2010-05-05 ENCOUNTER — Ambulatory Visit (INDEPENDENT_AMBULATORY_CARE_PROVIDER_SITE_OTHER): Payer: Medicare Other | Admitting: *Deleted

## 2010-05-05 DIAGNOSIS — I4891 Unspecified atrial fibrillation: Secondary | ICD-10-CM

## 2010-05-05 DIAGNOSIS — Z7901 Long term (current) use of anticoagulants: Secondary | ICD-10-CM

## 2010-05-17 ENCOUNTER — Ambulatory Visit (INDEPENDENT_AMBULATORY_CARE_PROVIDER_SITE_OTHER): Payer: Medicare Other | Admitting: *Deleted

## 2010-05-17 DIAGNOSIS — I4891 Unspecified atrial fibrillation: Secondary | ICD-10-CM

## 2010-05-22 ENCOUNTER — Other Ambulatory Visit: Payer: Self-pay | Admitting: Cardiology

## 2010-05-24 ENCOUNTER — Other Ambulatory Visit: Payer: Self-pay

## 2010-05-25 ENCOUNTER — Encounter: Payer: Self-pay | Admitting: Internal Medicine

## 2010-05-25 ENCOUNTER — Other Ambulatory Visit: Payer: Self-pay | Admitting: Cardiology

## 2010-06-07 ENCOUNTER — Ambulatory Visit (INDEPENDENT_AMBULATORY_CARE_PROVIDER_SITE_OTHER): Payer: Medicare Other | Admitting: *Deleted

## 2010-06-07 DIAGNOSIS — I4891 Unspecified atrial fibrillation: Secondary | ICD-10-CM

## 2010-06-07 LAB — POCT INR: INR: 2

## 2010-06-08 NOTE — Assessment & Plan Note (Signed)
[facility]                       [place] CARDIOLOGY OFFICE NOTE   NAME:[name] [name], [name] [name]                     MRN:          [id number]  DATE:06/05/2006                            DOB:          07-02-1940    IDENTIFICATION:  The patient is a 70 year old woman with a history of a  nonischemic cardiomyopathy, EF of 15% (with ICD).  She comes in today  not on a scheduled day because she is hearing something in her left ear,  a constant sound that she heard in bed.  She made an appointment on the  9th after this began, has noted no relief, no pulsation to the sound.  She denies shortness of breath.  No PND.  No orthopnea.  No  palpitations.   CURRENT MEDICATIONS:  1. Amiodarone 200 daily.  2. Coumadin as directed.  3. Aspirin __________.  4. Enalapril 10 [name].i.d.  5. Lasix 40 [name].i.d.  6. Aldactone 25 daily.  7. Potassium 40 daily.   PHYSICAL EXAMINATION:  GENERAL:  The patient is in no distress.  VITAL SIGNS:  Blood pressure 124/80, pulse 78 and regular, weight 214.  HEENT:  Remarkable for a left ear that has significant cerumen, unable  to see tympanic membrane, although in a very small part.  Right ear has  cerumen but able to see the tympanic membrane which is clear.  NECK:  JVP is normal.  LUNGS:  Clear.  No rales.  CARDIAC:  Regular rate and rhythm.  S1 S2.  No S3.  ABDOMEN:  Benign.  EXTREMITIES:  No edema.   IMPRESSION:  1. Sounds.  I feel it was secondary to cerumen covering the tympanic      membrane and occluding the eardrum.  I went ahead and irrigated      with warm water and with removal of a large plug of cerumen.  With      this the symptoms immediately resolved.  Volume status looks good.  2. Coronary artery disease with nonischemic cardiomyopathy, again      volume status looks good. She was followed closely by Dr. Felecia Shelling and      has had recent blood work.  He is following up with her potassium      which was 4.6, and BUN  creatinine which were 27 and 1.9.  Will not      make any changes.  3. 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   I will make sure the patient has followup as already scheduled.  Continue other medicines for now.     Pricilla Riffle, MD, [facility]     PVR/MedQ  DD: 06/05/2006  DT: 06/05/2006  Job #: [id number]   cc:   Tesfaye D. Felecia Shelling, MD

## 2010-06-08 NOTE — Assessment & Plan Note (Signed)
[facility]                         ELECTROPHYSIOLOGY OFFICE NOTE   NAME:[name], [name]                     MRN:          [id number]  DATE:03/06/2007                            DOB:          09/21/1940    [name] [name] is seen for nonischemic cardiomyopathy status post ICD  implantation.  She has atrial fibrillation that is paroxysmal.  She is  doing quite well without complaints of chest pain or shortness of  breath.  She is becoming increasingly active again and she is exercising  and doing her art work.   MEDICATIONS:  Lanoxin, Aldactone, furosemide, bisoprolol, enalapril,  amiodarone and Coumadin.   EXAMINATION:  Her blood pressure was 160/100, which is up a little from  last time and considerably from May.  Her pulse was 76.  LUNGS:  Clear.  Her heart sounds were regular.  EXTREMITIES:  Without edema.   Interrogation of her Medtronic device demonstrates a plugged LV port, a  P wave of 3.2 with an impedance of 424, a threshold of 1 V at 0.4.  The  R wave was 14.8 with an impedance of 424, a threshold 1 V at 0.3.  there  were no intercurrent therapies.   IMPRESSION:  1. Nonischemic cardiomyopathy.  2. Status post implantable cardioverter-defibrillator for the above.  3. Atrial fibrillation.  4. Amiodarone for both the atrial fibrillation as well as nonsustained      ventricular tachycardia.  5. Sleep apnea.   [name] [name] is doing well.  I, unfortunately, failed to get her  amiodarone surveillance laboratories and so we will need to arrange to  have them drawn in [place].  We will see her again one year's time.     Duke Salvia, MD, [facility]  Electronically Signed    SCK/MedQ  DD: 03/06/2007  DT: 03/07/2007  Job #: [id number]   cc:   Terald Sleeper Office  Tesfaye D. Felecia Shelling, MD

## 2010-06-08 NOTE — Consult Note (Signed)
NAME:  [name] [name], [name] [name]              ACCOUNT NO.:  [id number]   MEDICAL RECORD NO.:  [id number]          PATIENT TYPE:  INP   LOCATION:  A202                          FACILITY:  [facility]   PHYSICIAN:  Gerrit Friends. Dietrich Pates, MD, FACCDATE OF BIRTH:  1940-01-27   DATE OF CONSULTATION:  04/25/2007  DATE OF DISCHARGE:                                 CONSULTATION   REFERRING PHYSICIAN:  Tesfaye D. Felecia Shelling, MD   PRIMARY CARDIOLOGIST:  Dr. Tenny Craw.   PRIMARY ELECTROPHYSIOLOGIST:  Dr. Graciela Husbands.   HISTORY OF PRESENT ILLNESS:  A 70 year old woman with nonischemic  cardiomyopathy admitted with progressive dyspnea and pulmonary edema.  [name] [name] first presented in 2003, with congestive heart failure.  She  responded well to initial medical therapy.  She was reevaluated in 2007,  at which time she underwent an AICD implant.  She also was in atrial  fibrillation at that time and was started on amiodarone and  cardioverted.  She again returned to reasonable stability until the past  month or two when she has had progressive dyspnea.  She is bit of a  vague historian, so details concerning the past month are difficult to  elicit.  She has had no chest discomfort.  She has noted increased pedal  edema.  She has been weighing herself, but it is unclear whether or not  she has gained weight.  She was seen in the office 6 weeks ago at which  time she was felt to be doing fine.  The most recent weight we have is  from February 15, 2007, at which time a value of 231 pounds was obtained,  17 pounds increased from 3 months prior.  Her current weight is 109 kg.   She was evaluated in the emergency department where a chest x-ray showed  mild pulmonary edema.  BNP level was substantially elevated in excess of  2000.  She has been treated with diuretics with some improvement.   Her cardiac catheterization was performed in 2007, revealing an ejection  fraction of approximately 20% with no critical coronary disease.  A 50%  stenosis was present in the first diagonal.  She has a history of  hypertension, sleep apnea and renal insufficiency.   RECENT MEDICATIONS:  1. Enalapril 10 mg b.i.d.  2. Furosemide 20 mg daily.  3  Digoxin 0.125 mg daily.  1. Amiodarone 200 mg daily.  2. Spironolactone 25 mg daily.  3. Warfarin 5 mg 6 days per week and 7.5 mg 1 day per week.  4. Bisoprolol 5 mg daily.   SOCIAL HISTORY:  Resides locally; 60+ pack-year history of cigarette  smoking that was recently discontinued.  The patient denies any history  of asthmatic bronchitis or COPD.  No excessive use of alcohol.  Retired  from work as a Lawyer.   FAMILY HISTORY:  Positive for myocardial infarction in her mother and  kidney disease in her father.   REVIEW OF SYSTEMS:  Notable for, she has never received pneumococcal  vaccine.  Influenza vaccine is up-to-date.  She experiences intermittent  dizziness without syncope or falls.  She has chronic  edema that is now  worse.  She follows a regular diet.  There is a questionable prior  diagnosis of lupus erythematosus.  All other systems reviewed and are  negative.   PHYSICAL EXAMINATION:  GENERAL:  A pleasant garrulous woman in no acute  distress.  VITAL SIGNS:  Temperature is 97.5, heart rate 70 and regular,  respirations 20, blood pressure 115/60.  O2 saturation 92% on 2 liters.  HEENT:  Anicteric sclerae; normal lids and conjunctivae; normal oral  mucosa.  NECK:  Moderate jugulovenous distention; normal carotid upstrokes  without bruits.  ENDOCRINE:  No thyromegaly.  SKIN:  No significant lesions.  HEMATOPOIETIC:  No adenopathy.  LUNGS:  Prolonged expiratory phase with moderate expiratory wheezing.  CARDIAC:  Normal first and second heart sounds; modest systolic ejection  murmur.  ABDOMEN:  Soft and nontender; no organomegaly.  EXTREMITIES:  2+ edema; distal pulses intact.  NEUROLOGIC:  Symmetric strength and tone; normal cranial nerves.   Chest x-ray:  Cardiomegaly;  ICD in place; mild pulmonary edema, which  has progressed from a prior film.   EKG:  AV sequential pacing at a rate of 70.   Other laboratory notable for a creatinine of 2.1, which is somewhat  worse than her usual value.  CBC is normal.  Cardiac markers are  negative.  Digoxin level is low at 0.2.  INR is therapeutic at 3.1.   IMPRESSION:  [name] [name] presents with recurrent congestive heart failure  in the setting of a known severe ischemic cardiomyopathy.  She is  currently pacing, which is not advantageous in the setting of severe  left ventricular dysfunction.  Ultimately, consideration should be given  to upgrading her device to a biventricular system.  For now, she needs  diuresis.  It is unclear whether she has cardiac asthma or an  exacerbation of chronic obstructive pulmonary disease.  We will treat  her with bronchodilators for the time being.  She has a very low digoxin  level considering that her creatinine is in excess of 2.  I would wonder  whether this means she is not compliant with her full medical regimen.  She certainly is taking her warfarin.  Her medical regime, other than  the very low dose of furosemide, is fairly good. Ultimately, she might  benefit from further up titration of some of her drugs, especially beta-  blocker.   We appreciate the request for consultation and will be happy to follow  this nice woman with you.      Gerrit Friends. Dietrich Pates, MD, [facility]  Electronically Signed     RMR/MEDQ  D:  04/25/2007  T:  04/25/2007  Job:  [id number]

## 2010-06-08 NOTE — H&P (Signed)
NAME:  [name], [name]              ACCOUNT NO.:  [id number]   MEDICAL RECORD NO.:  [id number]          PATIENT TYPE:  INP   LOCATION:  A202                          FACILITY:  [facility]   PHYSICIAN:  Tesfaye D. Felecia Shelling, MD   DATE OF BIRTH:  [birth year]   DATE OF ADMISSION:  04/24/2007  DATE OF DISCHARGE:  LH                              HISTORY & PHYSICAL   CHIEF COMPLAINT:  Bilateral leg swelling and shortness of breath.   HISTORY OF PRESENT ILLNESS:  This is a 70 years old female patient with  a history of multiple medical illnesses including severe nonischemic  cardiomyopathy who came to emergency room due to the above complaints.  The patient claims she started having symptoms of shortness of breath  and increasing leg edema about 3 days back.  She went to cardiology  clinic for her followup.  The patient was then referred to emergency  room for evaluation and possible admission.  During evaluation in the  emergency room the patient was found to have a BNP of 2120.  Her chest x-  ray showed also bilateral congestive changes with cardiomegaly.  The  patient was started on IV diuresis and was admitted for further  treatment.   REVIEW OF SYSTEMS:  The patient has cough with whitish sputum.  She has  dyspnea and orthopnea.  No palpitation, chest pain, headache, fever,  chills, nausea, vomiting, abdominal pain, dysuria, urgency, or frequency  of urination.   PAST MEDICAL HISTORY:  1. Congestive heart failure secondary to nonischemic cardiomyopathy.  2. Status post ICD implantation.  3. History of acute pulmonary embolism.  4. Atrial fibrillation.  5. Chronic anticoagulation.  6. Renal insufficiency.  7. Obstructive sleep apnea syndrome.  8. History of nonsustained ventricular tachycardia.  9. Hypertension.   CURRENT MEDICATIONS:  1. Enalapril 10 mg b.i.d.  2. Furosemide 20 mg daily.  3. Lanoxin on 0.125 mg daily.  4. Amiodarone 200 mg daily.  5. Spironolactone 25 mg daily.  6.  Coumadin 5 mg daily.  7. Bisoprolol 5 mg daily.   SOCIAL HISTORY:  The patient is currently disabled due to her illness.  She has no history of alcohol, tobacco or substance abuse.   PHYSICAL EXAMINATION:  GENERAL:  The patient is alert awake and  chronically sick looking.  VITAL SIGNS: Blood pressure 115/62, pulse 69, respiratory rate 20,  temperature 97.5 degrees Fahrenheit.  HEENT: Pupils are equal and reactive.  NECK:  Supple.  CHEST:  Decreased air entry, few rhonchi and crackles at the base.  CARDIOVASCULAR SYSTEM:  First and second heart sounds are heard,  regular.  ABDOMEN:  Soft and lax.  Bowel sounds are positive.  No mass or  organomegaly.  EXTREMITIES: 4+ edema.   LABS ON ADMISSION:  PT 33.2, INR 3.1, CPK 157, CK-MB 5.7, troponin 0.05.  BNP 2120.  Digoxin 0.2.  BMP:  Sodium 139, potassium 3.8, chloride 105,  carbon dioxide 13, glucose 127.  26, creatinine 2.0, calcium 8.5.   ASSESSMENT:  1. Acute systolic congestive heart failure secondary to nonischemic      cardiomyopathy.  2.  Atrial fibrillation on anticoagulation.  3. Hypertension.  4. History of dual-chamber ICD implantation.  5. Renal insufficiency.  6. History of obstructive sleep apnea syndrome.   PLAN:  Will admit the patient under telemetry.  Will do serial EKG,  cardiac enzymes.  Will continue to diurese him with IV diuretics.  Will  monitor her BMP daily.  Will do all cardiology consult.  Will continue  the patient on anticoagulation.  Continue her regular treatment.      Tesfaye D. Felecia Shelling, MD  Electronically Signed     TDF/MEDQ  D:  04/25/2007  T:  04/25/2007  Job:  [id number]

## 2010-06-08 NOTE — Assessment & Plan Note (Signed)
 HEALTHCARE                       [facility] CARDIOLOGY OFFICE NOTE   NAME:[name], [name]                     MRN:          [id number]  DATE:02/15/2007                            DOB:          [birth year]    IDENTIFICATION:  [name] [name] is a 69 year old woman with a history of  nonischemic cardiomyopathy originally 15%.  She is status post BiV ICD.  I cannot find an echo after this procedure.  I last saw her back in the  spring.   At that time her biggest complaint was ringing in her ear, which  resolved with irrigation of some cerumen.   Over the past year.  Her breathing has been good.  She denies PND.  No  orthopnea.  No chest pain.  Able to do what she wants to do.  She  actually came back today from a couple of meetings and has line dancing  later.   CURRENT MEDICATIONS:  Include amiodarone 200 daily, Coumadin as  directed, potassium 40 mEq daily, enalapril 10 b.i.d., Lasix 40 b.i.d.  and Aldactone 25 daily.   Note last potassium was 5.2 in June, creatinine of 2.6.  This may have  been repeated will need to follow.   PHYSICAL EXAM:  The patient is in no acute distress.  Blood pressure is 140/90, pulse is 68, weight 231 up from 214 (the  patient quit smoking 3 month ago and is eating more).  NECK:  JVP is normal.  Lungs are clear.  Cardiac exam regular rate and rhythm, S1-S2.  No S3.  ABDOMEN:  Benign.  No hepatomegaly.  EXTREMITIES:  No edema.   IMPRESSION:  1. Nonischemic cardiomyopathy.  Has some coronary artery disease but I      cannot find the cath.  She is status post cardiac      resynchronization.  Has an appointment actually with Dr. Berton Mount later this month or next.  Clinically she is doing very well,      active, going to line dancing, volume status is good.  I will need      to check on the last labs.  2. History of atrial fibrillation on amiodarone and Coumadin.  3. History of sleep apnea.  4. History of renal  insufficiency.  5. Hypertension.  She says it is usually lower.  She is stressed      because she has been running      around today.  I will not change for now.  Will need to be      followed.  I will set follow-up for the summer.  Sooner if problems      develop.     [name] Riffle, MD, [facility]  Electronically Signed    PVR/MedQ  DD: 02/15/2007  DT: 02/15/2007  Job #: [id number]   cc:   [name] D. Felecia Shelling, MD

## 2010-06-08 NOTE — Discharge Summary (Signed)
NAME:  [name], [name]              ACCOUNT NO.:  [id number]   MEDICAL RECORD NO.:  [id number]          PATIENT TYPE:  INP   LOCATION:  A202                          FACILITY:  [facility]   PHYSICIAN:  Tesfaye D. Felecia Shelling, MD   DATE OF BIRTH:  1941-01-17   DATE OF ADMISSION:  04/24/2007  DATE OF DISCHARGE:  04/06/2009LH                               DISCHARGE SUMMARY   DISCHARGE DIAGNOSES:  1. Acute on chronic systolic congestive heart failure.  2. Atrial fibrillation on anticoagulation.  3. Hypertension.  4. Renal insufficiency.  5. History of dual-chamber ICD implantation.  6. Obstructive sleep apnea syndrome.  7. History of nonsustained ventricular tachycardia.   DISCHARGE MEDICATIONS:  1. Enalapril 10 mg p.o. b.i.d.  2. Lanoxin 125 mcg p.o. daily.  3. Amiodarone 200 mg p.o. daily.  4. Spironolactone 25 mg p.o. daily.  5. Bisoprolol 5 mg p.o. daily.  6. Lasix 40 mg b.i.d.  7. Coumadin 7.5 mg p.o. daily.   DISPOSITION:  The patient was discharged to home in stable condition.   HOSPITAL COURSE:  This is a 70 years old female patient with history of  multiple medical illnesses including severe nonischemic cardiomyopathy  who was admitted due to shortness of breath and worsening bilateral leg  edema.  The patient was found to be in acute on chronic congestive heart  failure.  On admission, her BNP was 2120.  Her chest x-ray showed  bilateral congestive changes and cardiomegaly.  The patient was admitted  and was started on IV diuretics.  Cardiology consult was done and  assisted in her management.  Over the hospital stay, the patient  diuresed well and she improved.  Her leg edema subsided and her  breathing improved.  She was back to her baseline.  Her diuretics were  adjusted, and the patient was discharged to home in stable condition to  continue follow-up in outpatient and also in follow-up with cardiology.      Tesfaye D. Felecia Shelling, MD  Electronically Signed    TDF/MEDQ  D:   05/28/2007  T:  05/28/2007  Job:  [id number]

## 2010-06-08 NOTE — Assessment & Plan Note (Signed)
[facility]                         ELECTROPHYSIOLOGY OFFICE NOTE   NAME:Dettloff, [place] B                     MRN:          [id number]  DATE:05/24/2007                            DOB:          1940/07/16    HISTORY:  [name] [name] is seen on somebody's recognizance other than mine.  She has a history of neuro QRS associated cardiomyopathy with congestive  heart failure.  She had seen Dr. Felecia Shelling who had decreased her diuretic  dose apparently sometime in March.  She was then having problems with  nocturnal dyspnea and orthopnea.  Somebody increased her Lasix dose and  she is much improved.  Her weight went from a peak of about 235 to now  at 228.   MEDICATIONS:  1. Furosemide 40 b.i.d.  2. Lanoxin 0.125.  3. Aldactone 25.  4. Bisoprolol 5.  5. Enalapril 10.  6. Coumadin.  7. Amiodarone.   PHYSICAL EXAMINATION:  VITAL SIGNS:  Blood pressure 118/78, her pulse  was 92.  LUNGS:  Clear.  HEART:  Sounds were regular.  EXTREMITIES:  No edema.  NECK:  Veins were flat.  SKIN:  Warm and dry.   PROCEDURE:  Review of her Insync Century ICD demonstrates the presence  of a 6949 lead which is stable.  Her optimal index is also flat for the  longest period of time since the device was implanted is about 3 weeks.   IMPRESSION:  1. Nonischemic cardiomyopathy with some concomitant coronary disease.  2. History of atrial fibrillation on amiodarone and Coumadin.  3. Congestive heart failure - chronic - systolic.  4. Renal insufficiency.   DISCUSSION:  Ms. Daniele is doing quite well symptomatically.  I am  concerned, however, about the potential impact of her medications on her  renal function and the secondary effects on her Lanoxin levels.   We will need to check amiodarone surveillance labs today as well as a  BMET to look at renal function and a digoxin level.   We will arrange follow up with Dr. Tenny Craw in the next 4 months.  We will  see her again in 1  years' time for device followup.     Duke Salvia, MD, [facility]  Electronically Signed    SCK/MedQ  DD: 05/24/2007  DT: 05/24/2007  Job #: [id number]   cc:   Tesfaye D. Felecia Shelling, MD  [facility] Sleeper

## 2010-06-11 NOTE — Procedures (Signed)
NAME:  Fontaine, [place]              ACCOUNT NO.:  [id number]   MEDICAL RECORD NO.:  [id number]          PATIENT TYPE:  OUT   LOCATION:  RAD                           FACILITY:  [facility]   PHYSICIAN:  [place] Bing, M.D.  DATE OF BIRTH:  07-17-1940   DATE OF PROCEDURE:  05/12/2004  DATE OF DISCHARGE:  05/12/2004                                  ECHOCARDIOGRAM   CLINICAL DATA:  A 70 year old woman with cardiomyopathy being considered for  research protocols.   1.  Technically adequate echocardiographic study.  2.  Moderate left atrial enlargement; mild to moderate right atrial      enlargement.  3.  Mild right ventricular dilatation; mild to moderate RV systolic      dysfunction.  No RVH.  4.  Mild aortic valvular sclerosis with normal function.  5.  Normal mitral valve; mild to moderate annular calcification; mild      regurgitation.  6.  Normal tricuspid valve; mild regurgitation; estimated RV systolic      pressure at the upper limit of normal to mildly increased.  7.  Normal proximal pulmonary artery.  8.  Grossly normal pulmonic valve.  9.  Moderate to marked left ventricular dilatation; mild hypertrophy; severe      global hypokinesis.  Estimated ejection fraction is 0.15.  10. Normal IVC.      RR/MEDQ  D:  05/17/2004  T:  05/17/2004  Job:  [id number]

## 2010-06-11 NOTE — Procedures (Signed)
NAME:  [name] [name], [name] [name]              ACCOUNT NO.:  [id number]   MEDICAL RECORD NO.:  [id number]          PATIENT TYPE:  OUT   LOCATION:  SLEEP LAB                     FACILITY:  [facility]   PHYSICIAN:  Marcelyn Bruins, M.D. [facility] DATE OF BIRTH:  September 10, 1940   DATE OF STUDY:  07/03/2005                              NOCTURNAL POLYSOMNOGRAM   REFERRING PHYSICIAN:  Dr. Vida Roller   INDICATION FOR STUDY:  Persistent disorder of initiating and maintaining  wakefulness.   EPWORTH SCORE:  5.   SLEEP ARCHITECTURE:  The patient had a total sleep time of 318 minutes with  adequate REM but never achieved slow wave sleep.  Sleep onset latency was  normal as was REM onset.  Sleep efficiency was decreased to 82%.   RESPIRATORY DATA:  The patient was found to have 72 hypopneas and 12 apneas  for a respiratory disturbance index of 16 events per hour.  Events occurred  in all body positions and were associated with mild to moderate snoring  throughout.   OXYGEN DATA.:  The patient had O2 desaturation as low as 82% with her  obstructive events.  O2 at 2 liters was added at 12:45 a.m. as the patient  usually wears at home secondary to O2 saturations being in the mid to high  80s at baseline independent of the patient's obstructive events.   CARDIAC DATA:  No clinically significant cardiac arrhythmias.   MOVEMENT-PARASOMNIA:  The patient has 37 leg jerks with 2.5 per hour  resulting in arousal or awakening.   IMPRESSION-RECOMMENDATIONS:  Mild obstructive sleep apnea/hypopnea syndrome  with a respiratory disturbance index of 16 events per hour and O2  desaturation as low as 82%.  Treatment for this degree of sleep  apnea should focus on weight loss if applicable, upper airway surgery, oral  appliance, and also CPAP.  Clinical correlation is suggested.                                           ______________________________  Marcelyn Bruins, M.D. [facility]  Diplomate, American Board of Sleep  Medicine     KC/MEDQ  D:  07/07/2005 21:42:05  T:  07/08/2005 00:11:44  Job:  [id number]

## 2010-06-11 NOTE — Discharge Summary (Signed)
NAME:  [name] [name], [name] [name]              ACCOUNT NO.:  [id number]   MEDICAL RECORD NO.:  [id number]          PATIENT TYPE:  INP   LOCATION:  4729                         FACILITY:  [facility]   PHYSICIAN:  [place] Bing, M.D. LHCDATE OF BIRTH:  [birth year]   DATE OF ADMISSION:  05/25/2005  DATE OF DISCHARGE:  06/02/2005                                 DISCHARGE SUMMARY   PRIMARY CARDIOLOGIST:  Vida Roller, M.D.   PRINCIPAL DIAGNOSES:  1.  Severe nonischemic cardiomyopathy.      1.  Status post Medtronic dual chamber ICD implantation.  2.  Acute pulmonary edema.  3.  Atrial fibrillation.      1.  Status post successful cardioversion to normal sinus rhythm.  4.  Coumadin anticoagulation.      1.  Initiated this admission.  5.  Pocket hematoma.  6.  Renal insufficiency.  7.  Obstructive sleep apnea.      1.  Associated polycythemia.   SECONDARY DIAGNOSES:  1.  History of nonsustained ventricular tachycardia.  2.  Hypertension.   PROCEDURES:  1.  Medtronic InSync Sentry dual chamber defibrillator implantation by Dr.      Sherryl Manges on May2, 2007.  2.  Transesophageal echocardiogram May4, 2007:  No distinct thrombus; EF 10-      15%.  3.  Successful DC cardioversion to normal sinus rhythm by Dr. Sherryl Manges      on May4, 2007   REASON FOR ADMISSION:  [name] [name] is a 70 year old female with known severe  nonischemic cardiomyopathy with an ejection fraction approximately 10% by  catheterization in March of 2007, with recent Doppler study suggestive of  dyssynchrony.  She was admitted for elective cardioverter defibrillator  implantation with cardiac resynchronization therapy.   HOSPITAL COURSE:  The patient underwent successful implantation of a  Medtronic dual chamber InSync defibrillator, by Dr. Sherryl Manges on May2,  2007.  However, cardiac resynchronization therapy (CRT) was deferred by Dr.  Graciela Husbands secondary to development of acute pulmonary edema.   The patient required  aggressive diuretic treatment, and medications were  also adjusted by Dr. Graciela Husbands, with substitution of metoprolol with bisoprolol  and discontinuation of Norvasc and clonidine.  The patient was also started  on amiodarone for treatment of paroxysmal atrial fibrillation and was  subsequently started on Coumadin this admission.   Pulmonary consult was requested by Dr. Graciela Husbands for evaluation of polycythemia  and to determine a safety timing of transesophageal echocardiography.  Chest  x-ray revealed no pneumothorax, no overt edema and no pleural effusions.  She was cleared to proceed with TEE by pulmonary, which was subsequently  performed on hospital day #2 by Dr. Simona Huh.  No obvious thrombus was  noted; EF 10-15%, and the patient was cleared to proceed with successful  cardioversion by Dr. Sherryl Manges, with restoration of normal sinus rhythm.   Nocturnal oximetry was monitored, and the patient had room air saturations  of 80%, improved to 87-88% on 2 liters of oxygen.  Recommendation was thus  made to initiate home oxygen supplementation.   The patient was maintained on intravenous heparin while  awaiting therapeutic  levels of Coumadin.   Dr. Graciela Husbands noted development of a pocket hematoma which was a closely  monitored and which remained stable by the time of discharge on hospital day  #8.   Pulmonary concluded that the patient's nocturnal desaturations were most  consistent with obstructive sleep apnea and recommended a formal outpatient  sleep study.  Arrangements were also made for the patient to be placed on  CPAP.   The patient remained clinically stable and a follow-up chest x-ray on May3,  2007 was negative for pulmonary edema.  She continued to have negative  output and was cleared for discharge on hospital day #8.   DISCHARGE LABORATORIES:  WBC 6.9, hemoglobin 17.1, hematocrit 51.8 (MCV 92),  platelet 198, INR 1.9.   DISCHARGE MEDICATIONS:  1.  Amiodarone 400 mg b.i.d.  (x3 days), then 200 mg b.i.d.  2.  Lanoxin 0.0625 degrees.  3.  Bisoprolol 5 mg daily.  4.  Enalapril 10 mg b.i.d.  5.  Coumadin 7.5 mg daily.  6.  Aldactone 25 mg daily.  7.  K-Dur 40 mEq daily.  8.  Furosemide 80 mg b.i.d.  9.  Zaroxolyn 1.25 mg daily.   INSTRUCTIONS:  The patient is instructed to not resume metoprolol,  clonidine, or Norvasc.   FOLLOW UP:  1.  [facility] on Monday for follow-up pro time.  The      patient will need a BMET at that time, as well.  2.  Dr. Vida Roller on Thursday, May17, 2007 at 1:00 p.m..  3.  [facility] on Thursday, May24, 2007 and 9:20 a.m. in      [place].  4.  The patient is scheduled for a sleep study at [facility] on      Wednesday, [place], 2007.  Arrangements will be made through their office.  5.  Dr. Delton Coombes of [facility] on Tuesday, May29, 2007 at 10:00 a.m.  6.  Dr. Sherryl Manges on Friday, June8, 2007 at 11:00 a.m. in [place], and      again on Tuesday, August14, 2007 at 2:00 p.m.   DISPOSITION:  Stable.   DICTATION DURATION:  65 minutes.      Gene Serpe, P.A. [facility]      Ely[place] Bing, M.D. [facility]  Electronically Signed    GS/MEDQ  D:  06/02/2005  T:  06/03/2005  Job:  [id number]   cc:   Tesfaye D. Felecia Shelling, MD  Fax: [phone]

## 2010-06-11 NOTE — Assessment & Plan Note (Signed)
[facility]                       [place] CARDIOLOGY OFFICE NOTE   NAME:[name] [name]                     MRN:          [id number]  DATE:01/04/2006                            DOB:          [birth year]    [name] [name] returns today for a follow up visit.  She Has nonischemic  cardiomyopathy with an EF of 15%.  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   She has hypertension as well.  She has been doing fairly well, her  weight is down.  She has been watching herself.  She has switched from  class I to II.  Her medications have not been adjusted in a while.  She's had a history of PAF and VT and is on low-dose amiodarone.  Last  time Dr. Dorethea Clan saw her in July, she had normal LFT's and a normal TSH.   We will check her PFT's, DLCO today as well as a BMET and BNP since she  is on spironolactone and ace inhibitors.   The patient is also on Coumadin, which is followed here in the clinic.   REVIEW OF SYSTEMS:  Benign.  Specifically, there is no PND or orthopnea,  no chest pain and no lower extremity edema.  Under exam, her weight is  stable and 209 lbs, blood pressure is 128/72, pulse is 71 with atrial  pacing.  HEENT is normal.  Lungs are clear.  Defibrillator is under left  collar bone, slightly low.  There is normal S1 and S2.  EKG shows atrial  pacing.  Medications are listed in the chart, they include potassium 20  per day, amiodarone 200 per day, digoxin 0.125, half tablet per day,  Lasix 80 mg daily, Coumadin is directed by our clinic, potassium 40 a  day, Enalapril 10 [name].i.d., spironolactone 12.5 per day.   IMPRESSION:  Stable.  No ischemic cardiomyopathy with AICD placement.  Continue current medical therapy.  Follow up with the device clinic in  three months.  I will see her back in six months.  We will check the BNP  today.  She will also have PFT's since she continues to smoke and is on  amiodarone.     [name] [name].  [name] Emms, MD, [facility]  Electronically Signed    PCN/MedQ  DD: 01/04/2006  DT: 01/04/2006  Job #: [id number]

## 2010-06-11 NOTE — Op Note (Signed)
NAME:  [name], [name]              ACCOUNT NO.:  [id number]   MEDICAL RECORD NO.:  [id number]          PATIENT TYPE:  INP   LOCATION:  4729                         FACILITY:  [facility]   PHYSICIAN:  Duke Salvia, M.D.  DATE OF BIRTH:  Dec 27, 1940   DATE OF PROCEDURE:  05/27/2005  DATE OF DISCHARGE:                                 OPERATIVE REPORT   PREOPERATIVE DIAGNOSIS:  Atrial fibrillation, previously implanted  defibrillator.   POSTOPERATIVE DIAGNOSIS:  Sinus rhythm.   PROCEDURE:  Defibrillation with threshold testing with intended  cardioversion.   DESCRIPTION OF PROCEDURE:  Following obtaining informed consent and prior  TEE by Dr. Jonelle Sidle, the patient was submitted to deep sedation.  Ventricular fibrillation was induced via the T-wave shock.  After a total  duration of 7 seconds a 25-joule shock was delivered to a measured  resistance of 48 ohms to ventricular fibrillation and restoring sinus  rhythm.   The patient tolerated the procedure without apparent complication; was  awakened and will be continued on Coumadin with anticipated discharge when  her INR is therapeutic.   We did note again resting supine hypoxemia with an O2 sat in the 84 to 85  range.  She was put on 5 L of O2.  I have rediscussed with Dr. Jasmine Awe who  will see her again.           ______________________________  Duke Salvia, M.D.     SCK/MEDQ  D:  05/27/2005  T:  05/29/2005  Job:  [id number]

## 2010-06-11 NOTE — Op Note (Signed)
NAME:  [name], [name]              ACCOUNT NO.:  [id number]   MEDICAL RECORD NO.:  [id number]          PATIENT TYPE:  INP   LOCATION:  4729                         FACILITY:  [facility]   PHYSICIAN:  Duke Salvia, M.D.  DATE OF BIRTH:  1940/06/11   DATE OF PROCEDURE:  DATE OF DISCHARGE:                                 OPERATIVE REPORT   PREOPERATIVE DIAGNOSES:  Congestive heart failure, mitral regurgitation,  nonischemic heart disease, and persistent atrial fibrillation.   POSTOPERATIVE DIAGNOSES:  Congestive heart failure, mitral regurgitation,  nonischemic heart disease, and persistent atrial fibrillation.   PROCEDURE:  Dual-chamber device implantation with plug in of the left  ventricular pacing port.   The patient is a 70 year old lady with significant heart failure.  She has  persistent atrial fibrillation and a long-standing poor ejection fraction in  the 15-20% range.   Pre-procedurally, she underwent TDI imaging, which was read as abnormal.  We  anticipated undertaking CRT implantation.  However, because of concerns  about the validity of the data, I reviewed the echo personally and looked at  the images for about 20 to 30 minutes trying to convince myself of the  presence of septal to lateral dyssynchrony.  Not withstanding bulls eye  plots, demonstrating marked standard deviations, it was my impression that  there was not significant dyssynchrony and that the measurements were skewed  by basilar migration.   With that having been said, I elected to put in a dual chamber device with  an LV pacing port plugged with the intention of cardioversion, reassessment,  and possible upgrade.   Following obtaining informed consent, the patient was then brought to the  electrophysiology laboratory and placed on the fluoroscopic table in a  supine position.  After routine prep and drape, lidocaine was infiltrated in  the prepectoral subclavicular region.  An incision was made and  carried down  to the level of prepectoral fascia.  Using sharp dissection with  electrocautery, a pocket was formed similarly.  Hemostasis was obtained.   Thereafter, attention was turned to gaining access to the extrathoracic left  subclavian vein, which was accomplished with mild difficulty, requiring a  left upper extremity venogram to identify the course and the patency of the  extrathoracic left subclavian vein.  With this having been accomplished,  venipunctures were accomplished x2, without difficulty.  7-French tearaway  introducer sheaths were placed, which were then passed through Medtronic  6949 65 cm __________ dual coal defibrillator lead, serial number  [id number].  Under fluoroscopic guidance, it was admitted freely to the  right ventricular apex, where the bipolar R-wave was 14.9, with a pacing  impedence of 16 ohms and threshold of 0.6 V at 0.5 msec.  Current threshold  is 0.72 mA.  There is no diaphragmatic pacing at 10 V, and the current of  injury was adequate.   This lead was secured and over the second retained guidewire was placed  another 7-French sheath, which was then passed a Medtronic 5076, 52 cm  __________ atrial lead, serial number [id number].  Under fluoroscopic  guidance, it was admitted freely to  the to the right atrial appendage where  the bipolar fibrillation wave was 0.8 mV, with a pacing impedence of 832  ohms.  This lead was secured to the prepectoral fascia as well.  Leads were  then attached a Medtronic InSync Sentry [id number] ICD, serial number [id number].  Through the device, the bipolar P-wave was 0.5, with a pacing impedence of  792, and a threshold was not obtained, as the patient was in fibrillation.  The bipolar R-wave was 7.4 with impedence of 712 and threshold at 1 V of 0.2  milliseconds.  Proximal coil impedence was 54, distal coil impedence was 43.  At this point, the device was implanted.  The pocket was copiously irrigated  with  antibiotic-containing saline solution.  Hemostasis was assured and the  leads and the pulse generator were placed in the pocket and secured to the  prepectoral fascia.  The wound was closed in three layers in a normal  fashion.  The wound was washed and dried and then Benzoin and Steri-Strip  dressing was applied.  Needle count, sponge count, and instrument count were  correct at the end of the procedure, according to the staff.  The patient  tolerated the procedure without apparent complication.           ______________________________  Duke Salvia, M.D.     SCK/MEDQ  D:  05/25/2005  T:  05/25/2005  Job:  [id number]   cc:   Vida Roller, M.D.  Fax: [phone]   Tesfaye D. Felecia Shelling, MD  Fax: [phone]   Electrophysiology Laboratory   [facility]

## 2010-06-11 NOTE — Assessment & Plan Note (Signed)
[facility]                           ELECTROPHYSIOLOGY OFFICE NOTE   NAME:[name] [name]                     MRN:          [id number]  DATE:09/06/2005                            DOB:          November 20, 1940    HISTORY OF PRESENT ILLNESS:  [name] [name] is seen following ICD implantation  for nonsustained VT with nonischemic heart disease.  She is doing quite well  without complaints.   There is no chest pain or shortness of breath.   Medications were reviewed and are notable for the intercurrent up titration  of her Lasix.  She is on Coumadin for her atrial fibrillation.   PHYSICAL EXAMINATION:  VITAL SIGNS:  On examination her blood pressure is  125/72, pulse was 70.  LUNGS:  Clear.  HEART:  Heart sounds were regular.  EXTREMITIES:  Without edema.   Interrogation of her Medtronic Sentry ICD demonstrates a P-wave of 3.2 with  impedence of 464, threshold of 1 volt at 0.3  ROV measurements were 15/512/1-  0.2.  Variable is 3.17.  No intercurrent episodes of __________   IMPRESSION:  1. Nonischemic cardiomyopathy.  2. Status post ICD for the above.  3. Atrial fibrillation - stable.   [name] [name] is stable.  See her again in one year's time.  She should be  followed remotely in the interim.                                   Duke Salvia, MD, [facility]   SCK/MedQ  DD:  09/06/2005  DT:  09/07/2005  Job #:  [id number]   cc:   Tesfaye D. Felecia Shelling, MD  [facility]

## 2010-06-11 NOTE — Cardiovascular Report (Signed)
NAME:  [name] [name], [name] [name]              ACCOUNT NO.:  [id number]   MEDICAL RECORD NO.:  [id number]          PATIENT TYPE:  OIB   LOCATION:  1962                         FACILITY:  [facility]   PHYSICIAN:  Charlies Constable, M.D. [facility] DATE OF BIRTH:  [birth year]   DATE OF PROCEDURE:  04/15/2005  DATE OF DISCHARGE:  04/15/2005                              CARDIAC CATHETERIZATION   CLINICAL HISTORY:  [name] is 70 years old and was recently seen in  consultation by Dr. Graciela Husbands.  She has had congestive heart failure and has an  ejection fraction of 15%.  She also has atrial fibrillation which we think  is new onset.  She has had recurrent congestive heart failure.  Dr. Graciela Husbands  saw her in consultation for a possible ICD and possible BiV pacer and  arranged for her to have catheterization today.   PROCEDURE:  Right heart catheterization was performed percutaneously via the  right femoral vein using a mini sheath and Swan-Ganz thermodilution  catheter.  Left heart catheterization was performed percutaneously via the  right femoral artery using an arterial sheath and 6-French preformed  coronary catheters.  A femoral arterial puncture was performed and Omnipaque  contrast was used.  The patient tolerated the procedure well and left the  laboratory in satisfactory condition.   RESULTS:  1.  Left main coronary artery was free of significant disease.  2.  The left anterior descending artery gave to rise to a large and smaller      diagonal branch and three septal perforators.  There is a 50% ostial      stenosis in the large diagonal branch.  There were irregularities in the      LAD but no significant obstruction.  3.  The circumflex gave rise to a very large ramus branch, a marginal branch      and two small posterolateral branches.  There were irregularities in      these vessels but no significant obstruction.  4.  The right coronary artery was a moderate size vessel and gave rise to a      conus  branch, two right ventricular branches, a posterior descending      branch, and two posterolateral branches.  The right coronary artery was      irregular with no significant obstruction.  5.  The left ventriculogram performed in the RAO projection showed severe      global hypokinesis with an estimated ejection fraction of 10%.   HEMODYNAMIC DATA:  The right atrial pressure was 6 mean.  The right  ventricular pressure was 63/6.  The pulmonary artery pressure was 63/26 with  a mean of 43.  Pulmonary wedge pressure was 17 mean.  Left ventricular  pressure was 123/21.  Aortic pressure was 123/76 with a mean of 94.  Cardiac  output/cardiac index was 3.7/1.9 liters/minute/m2 __________.   CONCLUSION:  1.  Nonobstructive coronary artery disease with 50% narrowing in the first      large diagonal branch of the left anterior descending, irregularities in      the circumflex and right coronary artery.  2.  Severe global hypokinesis with an estimated ejection fraction of 10%.   RECOMMENDATIONS:  The patient has a severe nonobstructive cardiomyopathy.  I  will discuss the plans with Dr. Graciela Husbands.  He plans to place an ICD and has  plans to do a tissue Doppler to see if she would be a candidate for BiV  pacing.  If not, she might be a candidate for the [employer]  Trial.  The other issue is her atrial fibrillation and another attempt  should be made to restore sinus rhythm.           ______________________________  Charlies Constable, M.D. [facility]     BB/MEDQ  D:  04/15/2005  T:  04/18/2005  Job:  [id number]   cc:   Duke Salvia, M.D.  [address]  [address]  [place]  [place] [address]   Vida Roller, M.D.  Fax: [phone]   Tesfaye D. Felecia Shelling, MD  Fax: [phone]   Cardiopulmonary Lab

## 2010-06-11 NOTE — H&P (Signed)
NAME:  [name] [name], [name] [name]              ACCOUNT NO.:  [id number]   MEDICAL RECORD NO.:  [id number]          PATIENT TYPE:  INP   LOCATION:  2899                         FACILITY:  [facility]   PHYSICIAN:  Duke Salvia, M.D.  DATE OF BIRTH:  [birth year]   DATE OF ADMISSION:  05/25/2005  DATE OF DISCHARGE:                                HISTORY & PHYSICAL   CARDIOLOGIST:  Vida Roller, M.D.   PRIMARY CARE PHYSICIAN:  Tesfaye D. Felecia Shelling, M.D.   ALLERGIES:  She has ALLERGY TO PENICILLIN.   PRESENTING CIRCUMSTANCE:  I hope I get my device today.   HISTORY OF PRESENT ILLNESS:  [name] [name] is a 70 year old female with known  nonischemic cardiomyopathy. Her ejection fraction is 10% by catheterization  done March 2007. A Doppler study performed May 03, 2005 demonstrates  dyssynchrony.   She has some decrease in ability to carry on daily tasks. Her CHF Class is  II-III. She has a history of NSVT. During her complete cardiac workup which  included the catheterization, tissue Doppler echocardiogram and  electrocardiograms done in 2007. It is noted that she is atrial  fibrillation. This is a new diagnosis for her. She claims intermittent  palpitations but does not notice any significant symptoms with them. Her  heart rate is irregular this morning. She presents for cardioverter  defibrillator with cardiac resynchronization lead implantation today in view  of long-standing congestive cardiomyopathy. Her cardiac risk factors include  hypertension, has a family history of coronary artery disease. She has no  cardiac risk equivalents.   MEDICATIONS:  1.  Lanoxin 0.25 mg daily.  2.  Clonidine 0.1 mg daily.  3.  Furosemide 40 mg daily.  4.  Enalapril 5 mg twice daily.  5.  Metoprolol 25 mg twice daily.  6.  K-Dur 20 mEq daily.  7.  Norvasc 5 mg daily.  8.  Metolazone 2.5 mg daily.   SOCIAL HISTORY:  The patient lives in [place] with her niece. She is retired.  She has worked at several jobs. She  was a Runner, broadcasting/film/video. She has a  history of tobacco habituation, having quit three months ago before quitting  one half to one pack per day. No alcoholic beverages.   FAMILY HISTORY:  Mother died at age 19 of a myocardial infarction. Father  died at age 90. She has five sisters and three brothers. Two brothers have  predeceased her; one of  myocardial infarction.   REVIEW OF SYSTEMS:  The patient has a poor appetite but is not reporting  fevers, chills, night sweats or any significant weight change, either gain  or loss. HEENT:  No epistaxis. No vertigo. No photophobia. INTEGUMENT:  No  rashes. No nonhealing ulcerations. CARDIOPULMONARY:  No chest pain. Some  dyspnea on exertion. No orthopnea. No paroxysmal nocturnal dyspnea.  EXTREMITIES:  Lower extremity related to venous stasis. She has no history  of syncope or presyncope. She has intermittent perception of palpitation.  UROGENITAL:  Nocturia three to four times. The patient says I am on  furosemide. NEUROPSYCHIATRIC:  No weakness, numbness, depression or  anxiety. GASTROINTESTINAL:  No ongoing reflux. No history of GI bleeding.  ENDOCRINE:  No history of diabetes. TSH will be checked. MUSCULOSKELETAL:  No arthralgias. No joint swellings or effusions. No deformities. All other  systems negative.   PHYSICAL EXAMINATION:  GENERAL:  Alert and oriented x3 in no acute distress.  VITAL SIGNS:  Temperature 98.3, pulse 87, blood pressure 128/82,  respirations 20. Oxygen saturation 92% on room air.  HEENT:  Normocephalic, atraumatic. Eyes:  Pupils are equal, round, and  reactive to light. Extraocular movements are intact. Sclerae are nonicteric.  Nares without discharge.  NECK:  Supple. No carotid bruits auscultated. No thyromegaly with only mild  jugular venous distention. No cervical lymphadenopathy.  HEART:  Irregular rate and rhythm. Grade 2/6 holosystolic murmur.  LUNGS:  Relatively clear to auscultation.  ABDOMEN:  Soft.  Nondistended. Mildly obese. No hepatosplenomegaly. Abdominal  aorta unable to palpate.  EXTREMITIES:  Taut lower extremity edema secondary to venous stasis.  Dorsalis pedis pulse 2/4 bilaterally. Radial pulses 3/4 bilaterally,  irregular.  MUSCULOSKELETAL:  No joint deformities or effusions.  NEUROLOGICAL:  No neurological deficits noted.   STUDIES:  EKG showing atrial fibrillation, rate of 84, controlled, QRS less  than or equal to 100 milliseconds. Laboratory studies are pending. They are  CBC and BMET.   IMPRESSION:  1.  Admitted for implantation of cardioverter defibrillator with cardiac      resynchronization therapy.  2.  Known ischemic cardiomyopathy, ejection fraction 10% at catheterization      April 15, 2005.  3.  Tissue Doppler study May 03, 2005, ejection fraction 20%,      dyssynchrony.  4.  Hypertension.  5.  Congestive heart failure, Class II-III.  6.  Venous stasis.  7.  Nonsustained ventricular tachycardia.  8.  Atrial fibrillation diagnosed 2007, not on Coumadin.  9.  Strong family history of coronary artery disease.   PLAN:  1.  Admit for BiV ICD.  2.  Initiate anticoagulation prior to discharge.  3.  Adjust medications in view of severe cardiomyopathy.     Maple Mirza, P.A.    ______________________________  Duke Salvia, M.D.   GM/MEDQ  D:  05/25/2005  T:  05/25/2005  Job:  [id number]

## 2010-06-24 ENCOUNTER — Ambulatory Visit (INDEPENDENT_AMBULATORY_CARE_PROVIDER_SITE_OTHER): Payer: Medicare Other | Admitting: Internal Medicine

## 2010-06-24 ENCOUNTER — Encounter: Payer: Self-pay | Admitting: Internal Medicine

## 2010-06-24 ENCOUNTER — Encounter: Payer: Self-pay | Admitting: *Deleted

## 2010-06-24 VITALS — BP 120/72 | HR 85 | Wt 205.0 lb

## 2010-06-24 DIAGNOSIS — Z01818 Encounter for other preprocedural examination: Secondary | ICD-10-CM

## 2010-06-24 DIAGNOSIS — I428 Other cardiomyopathies: Secondary | ICD-10-CM

## 2010-06-24 DIAGNOSIS — I5022 Chronic systolic (congestive) heart failure: Secondary | ICD-10-CM

## 2010-06-24 DIAGNOSIS — I4891 Unspecified atrial fibrillation: Secondary | ICD-10-CM

## 2010-06-24 DIAGNOSIS — Z95810 Presence of automatic (implantable) cardiac defibrillator: Secondary | ICD-10-CM

## 2010-06-24 LAB — APTT: aPTT: 36 seconds (ref 24–37)

## 2010-06-24 NOTE — Assessment & Plan Note (Signed)
Her device is at end-of-life. We will schedule her for ICD generator change plus or minus lead revision as soon as possible.

## 2010-06-24 NOTE — Assessment & Plan Note (Signed)
She appears to be maintaining sinus rhythm very nicely on low-dose amiodarone. Continue current medications.

## 2010-06-24 NOTE — Assessment & Plan Note (Signed)
Her current symptoms appear to be class II. She will continue her current medications and maintain a low-sodium diet.

## 2010-06-24 NOTE — Progress Notes (Signed)
HPI [name] returns today for followup. She is a 70 year old woman with a nonischemic cardiomyopathy, congestive heart failure, and atrial fibrillation. The patient is status post ICD implantation. She has reached end of life on her device. She has fairly marked bradycardia. She denies chest pain. No peripheral edema. She has been noncompliant with her followup. Allergies  Allergen Reactions  . Penicillins      Current Outpatient Prescriptions  Medication Sig Dispense Refill  . acetaminophen (TYLENOL) 500 MG tablet Take 500 mg by mouth as needed.        Marland Kitchen ALDACTONE 25 MG tablet TAKE ONE TABLET BY MOUTH ONCE DAILY.  30 each  0  . amiodarone (PACERONE) 200 MG tablet Take 200 mg by mouth daily.        Marland Kitchen buPROPion (WELLBUTRIN SR) 100 MG 12 hr tablet Take 100 mg by mouth daily.        . carvedilol (COREG) 25 MG tablet Take 1 tablet (25 mg total) by mouth 2 (two) times daily with a meal.  60 tablet  3  . colchicine 0.6 MG tablet Take 0.6 mg by mouth daily.        Marland Kitchen COUMADIN 5 MG tablet TAKE AS DIRECTED.  45 each  1  . enalapril (VASOTEC) 10 MG tablet Take 10 mg by mouth 2 (two) times daily.        Marland Kitchen HYDROcodone-acetaminophen (VICODIN) 5-500 MG per tablet Take 1 tablet by mouth as needed.        Marland Kitchen LANOXIN 0.125 MG tablet TAKE (1) TABLET BY MOUTH ONCE DAILY.  30 each  5  . LASIX 40 MG tablet TAKE 1 TABLET BY MOUTH   TWICE A DAY.  60 each  0  . prednisoLONE acetate (PRED FORTE) 1 % ophthalmic suspension 1 drop as needed.           Past Medical History  Diagnosis Date  . Nonischemic cardiomyopathy 1998  . Pulmonary HTN April 2009  . HTN (hypertension)   . Atrial fibrillation   . Nonsustained ventricular tachycardia   . OSA (obstructive sleep apnea)   . CKD (chronic kidney disease)   . Venous stasis   . Gout     ROS:   All systems reviewed and negative except as noted in the HPI.   Past Surgical History  Procedure Date  . Appendectomy 1987  . Incision and drainage intra oral  abscess 1991    submandibular abscess  . Cardiac defibrillator placement may 2007    Medtronic insync, Bi [place]  . Cardiac catheterization     March 2007     Family History  Problem Relation Age of Onset  . Heart attack Mother 50  . Kidney failure Father 50  . Heart attack Brother      History   Social History  . Marital Status: Single    Spouse Name: N/A    Number of Children: N/A  . Years of Education: N/A   Occupational History  . Retired    Social History Main Topics  . Smoking status: Current Everyday Smoker -- 0.5 packs/day for 30 years    Types: Cigarettes  . Smokeless tobacco: Never Used  . Alcohol Use: No  . Drug Use: No  . Sexually Active: Not on file   Other Topics Concern  . Not on file   Social History Narrative  . No narrative on file     BP 120/72  Pulse 85  Wt 205 lb (92.987 kg)  Physical Exam:  Well appearing NAD HEENT: Unremarkable Neck:  No JVD, no thyromegally Lymphatics:  No adenopathy Back:  No CVA tenderness Lungs:  Clear. Well-healed ICD incision. HEART:  Regular rate rhythm, no murmurs, no rubs, no clicks Abd:  Flat, positive bowel sounds, no organomegally, no rebound, no guarding Ext:  2 plus pulses, no edema, no cyanosis, no clubbing Skin:  No rashes no nodules Neuro:  CN II through XII intact, motor grossly intact  DEVICE  Normal device function.  See PaceArt for details. End of life.  Assess/Plan:

## 2010-06-24 NOTE — Patient Instructions (Signed)
Your physician recommends that you schedule a follow-up appointment in: to be scheduled after procedure Your physician has recommended that you have a defibrillator inserted. An implantable cardioverter defibrillator (ICD) is a small device that is placed in your chest or, in rare cases, your abdomen. This device uses electrical pulses or shocks to help control life-threatening, irregular heartbeats that could lead the heart to suddenly stop beating (sudden cardiac arrest). Leads are attached to the ICD that goes into your heart. This is done in the hospital and usually requires an overnight stay. Please see the instruction sheet given to you today for more information. (battery change only)Your physician recommends that you return for lab work in: today

## 2010-06-25 ENCOUNTER — Ambulatory Visit (HOSPITAL_COMMUNITY): Payer: Medicare Other

## 2010-06-25 ENCOUNTER — Ambulatory Visit (HOSPITAL_COMMUNITY)
Admission: RE | Admit: 2010-06-25 | Discharge: 2010-06-26 | Disposition: A | Discharge status: Home / Self Care | Payer: Medicare Other | Source: Ambulatory Visit | Attending: Internal Medicine | Admitting: Internal Medicine

## 2010-06-25 DIAGNOSIS — I5022 Chronic systolic (congestive) heart failure: Secondary | ICD-10-CM

## 2010-06-25 DIAGNOSIS — I428 Other cardiomyopathies: Secondary | ICD-10-CM

## 2010-06-25 DIAGNOSIS — Z4502 Encounter for adjustment and management of automatic implantable cardiac defibrillator: Secondary | ICD-10-CM | POA: Insufficient documentation

## 2010-06-25 DIAGNOSIS — I447 Left bundle-branch block, unspecified: Secondary | ICD-10-CM | POA: Insufficient documentation

## 2010-06-25 DIAGNOSIS — I509 Heart failure, unspecified: Secondary | ICD-10-CM | POA: Insufficient documentation

## 2010-06-25 LAB — CBC WITH DIFFERENTIAL/PLATELET
Hemoglobin: 15.4 g/dL — ABNORMAL HIGH (ref 12.0–15.0)
Lymphocytes Relative: 34 % (ref 12–46)
Lymphs Abs: 1.8 10*3/uL (ref 0.7–4.0)
MCH: 29.8 pg (ref 26.0–34.0)
Monocytes Relative: 8 % (ref 3–12)
Neutro Abs: 2.6 10*3/uL (ref 1.7–7.7)
Neutrophils Relative %: 51 % (ref 43–77)
RBC: 5.17 MIL/uL — ABNORMAL HIGH (ref 3.87–5.11)

## 2010-06-25 LAB — SURGICAL PCR SCREEN
MRSA, PCR: NEGATIVE
Staphylococcus aureus: NEGATIVE

## 2010-06-25 LAB — BASIC METABOLIC PANEL
CO2: 30 mEq/L (ref 19–32)
Chloride: 101 mEq/L (ref 96–112)
Potassium: 4.3 mEq/L (ref 3.5–5.3)
Sodium: 141 mEq/L (ref 135–145)

## 2010-06-26 ENCOUNTER — Ambulatory Visit (HOSPITAL_COMMUNITY): Payer: Medicare Other

## 2010-06-26 LAB — PROTIME-INR: INR: 2.9 — ABNORMAL HIGH (ref 0.00–1.49)

## 2010-07-05 ENCOUNTER — Ambulatory Visit (INDEPENDENT_AMBULATORY_CARE_PROVIDER_SITE_OTHER): Payer: Medicare Other | Admitting: *Deleted

## 2010-07-05 ENCOUNTER — Encounter: Payer: Medicare Other | Admitting: *Deleted

## 2010-07-05 DIAGNOSIS — I5022 Chronic systolic (congestive) heart failure: Secondary | ICD-10-CM

## 2010-07-05 DIAGNOSIS — Z95810 Presence of automatic (implantable) cardiac defibrillator: Secondary | ICD-10-CM

## 2010-07-05 DIAGNOSIS — I428 Other cardiomyopathies: Secondary | ICD-10-CM

## 2010-07-05 DIAGNOSIS — I4891 Unspecified atrial fibrillation: Secondary | ICD-10-CM

## 2010-07-05 LAB — POCT INR: INR: 5

## 2010-07-08 ENCOUNTER — Encounter: Payer: Medicare Other | Admitting: *Deleted

## 2010-07-14 ENCOUNTER — Ambulatory Visit (INDEPENDENT_AMBULATORY_CARE_PROVIDER_SITE_OTHER): Payer: Medicare Other | Admitting: *Deleted

## 2010-07-14 DIAGNOSIS — I4891 Unspecified atrial fibrillation: Secondary | ICD-10-CM

## 2010-07-29 NOTE — Discharge Summary (Signed)
  NAME:  Macht, [place]              ACCOUNT NO.:  [id number]  MEDICAL RECORD NO.:  [id number]           PATIENT TYPE:  O  LOCATION:  6524                         FACILITY:  [facility]  PHYSICIAN:  Bevelyn Buckles. Porscha Axley, MDDATE OF BIRTH:  1940/03/11  DATE OF ADMISSION:  06/25/2010 DATE OF DISCHARGE:  06/26/2010                              DISCHARGE SUMMARY   PRIMARY CARDIOLOGIST:  Gerrit Friends. Dietrich Pates, MD, [facility]  PRIMARY ELECTROPHYSIOLOGIST:  Doylene Canning. Ladona Ridgel, MD  DISCHARGE DIAGNOSIS:  Defibrillator end-of-life. A.  Status post insertion of new Medtronic biventricular implantable cardiac defibrillator/left ventricular defibrillator lead, this admission.  SECONDARY DIAGNOSES: 1. Nonischemic cardiomyopathy.     a.     Ejection fraction 20%. 2. Chronic systolic heart failure. 3. Atrial fibrillation.     a.     Chronic Coumadin. 4. Pulmonary hypertension. 5. Hypertension. 6. History of ventricular tachycardia (nonsustained). 7. Chronic kidney disease.  REASON FOR ADMISSION:  [name] [name] is a 70 year old female, with cardiac history as outlined above, who was admitted for elective explantation of previously implanted BiV ICD, secondary to ERI, per Dr. Lewayne Bunting.  HOSPITAL COURSE:  The patient underwent explantation of her old ICD device, with replacement of a new Medtronic BiV ICD, including placement of a new LV defibrillator lead, with no noted complications.  She was kept for overnight observation, and cleared for discharge the following morning, in hemodynamically stable condition.  Postop CXR suggested no acute changes.  Postop ICD interrogation pending, at this time.  Of note, Dr. Gala Romney conferred with Dr. Lewayne Bunting regarding pacemaker site, as revealed by x-ray.  He felt that the device looked a little laterally placed, but otherwise stable.  Following discussion with Dr. Ladona Ridgel, who provided reassurance that this was stable, the patient was cleared for  discharge.  DISCHARGE LABORATORIES:  INR 2.9.  DISPOSITION:  Stable.  FOLLOWUP: 1. [facility] in 2 weeks. 2. Dr. Lewayne Bunting in 3 months [facility]). 3. Dr. Worthington Bing, as scheduled. 4. [facility]) in 7 - 10 days, as     scheduled.  DISCHARGE MEDICATIONS: 1. Coumadin 5 mg as directed. 2. Aldactone 25 mg daily. 3. Amiodarone 200 mg daily. 4. Colchicine 0.6 mg daily. 5. Carvedilol 25 mg b.i.d. 6. Enalapril 10 mg b.i.d. 7. Furosemide 40 mg b.i.d. 8. Digoxin 0.125 mg daily. 9. Vicodin 5/500 mg b.i.d. p.r.n. 10.Wellbutrin 100 mg daily.  DURATION OF DISCHARGE ENCOUNTER:  Greater than 30 minutes, including physician time.     Gene Serpe, PA-C   ______________________________ Bevelyn Buckles. Azarah Dacy, MD    GS/MEDQ  D:  06/26/2010  T:  06/26/2010  Job:  [id number]  cc:   Catalina Pizza, M.D.  Electronically Signed by Rozell Searing PA-C on 06/29/2010 10:00:20 AM Electronically Signed by Arvilla Meres MD on 07/29/2010 03:18:58 PM

## 2010-08-02 ENCOUNTER — Ambulatory Visit (INDEPENDENT_AMBULATORY_CARE_PROVIDER_SITE_OTHER): Payer: Medicare Other | Admitting: *Deleted

## 2010-08-02 DIAGNOSIS — I4891 Unspecified atrial fibrillation: Secondary | ICD-10-CM

## 2010-08-02 LAB — POCT INR: INR: 3.5

## 2010-08-12 NOTE — Op Note (Signed)
NAME:  [name] [name], [name] [name]              ACCOUNT NO.:  [id number]  MEDICAL RECORD NO.:  [id number]           PATIENT TYPE:  O  LOCATION:  6524                         FACILITY:  [facility]  PHYSICIAN:  Doylene Canning. Ladona Ridgel, MD    DATE OF BIRTH:  1940-08-30  DATE OF PROCEDURE:  06/25/2010 DATE OF DISCHARGE:                              OPERATIVE REPORT   PROCEDURE PERFORMED:  Insertion of a new left ventricular defibrillator lead followed by removal of her previously implanted BiV ICD which had reached ERI followed by ICD pocket revision and insertion of a new BiV ICD with defibrillation threshold testing.  INTRODUCTION:  The patient is a 70 year old woman with a nonischemic cardiomyopathy and congestive heart failure and left bundle-branch block.  She has class II symptoms, but has been V-pacing now for a period of time.  She previously had a BiV ICD placed with the LV port capped after the patient developed congestive heart failure on the table.  She has now reached elective replacement indication and is referred for removal of her old device and insertion of a new one. After discussion with the patient and because she has ventricular pacing with very significant AV conduction system disease (her PR interval was 350 msec).  It was deemed most appropriate to attempt at placing LV lead along with insertion of a new BiV device.  PROCEDURE:  After informed consent was obtained, the patient was taken to the diagnostic EP lab in the fasting state.  After the usual preparation and draping, intravenous fentanyl and midazolam was given for sedation.  Lidocaine 30 mL was infiltrated into the left infraclavicular region.  A 7-cm incision was carried out over this region.  Electrocautery was utilized to dissect down to the fascial plane.  Care was taken not to enter the ICD pocket.  The left subclavian vein was then punctured, and the guiding catheter was advanced along with the 6-French hexapolar EP  catheter into the subclavian vein and on into the right atrium.  The coronary sinus was cannulated without particular difficulty and initial attempts were made not to use any contrast secondary to her creatinine of 2.3.  The lateral vein was cannulated with a 0.014 guidewire, but did not coarse in the proper direction.  A 10 mL of contrast was injected into the coronary sinus demonstrating a curlicue shepherd's crook lateral vein.  With a Luge guidewire, this was traversed without difficulty, and the Medtronic Attain Ability model 4196 80-cm passive fixation LV pacing lead, serial number [id number], was advanced about halfway from base to apex into the lateral vein.  Diaphragmatic stimulation was seen at 5 V and the lead was retracted back to a position approximately one-third from base to apex, and at this location there was no diaphragmatic stimulation and the threshold was around 1.5 V at 0.4 msec (LV tip to RV coil).  With these satisfactory parameters, the lead was secured to the subpectoralis fascia with a figure-of-eight silk suture after it was liberated from its guiding catheter in the usual manner.  At this point, the sewing sleeve was also used to secure the lead.  The pocket  was then evaluated and electrocautery was utilized to enter the ICD pocket.  The old generator was removed and the new Medtronic Protecta, serial number [id number], was connected to the atrial RV and LV leads and placed back in the subcutaneous pocket.  The patient did have a 6949 lead but it was working satisfactorily and the decision was made not to insert a new defibrillator lead at this time.  With all of the above, the pocket was irrigated and the new device was connected to the leads as previously mentioned and placed back in the subcutaneous pocket after the pocket was revised to accommodate the new LV lead in the different shaped device.  At this point, the patient was more deeply sedated  and defibrillation threshold testing carried out.  After the patient was more deeply sedated with fentanyl and Versed, VF was induced with a T-wave shock.  A 20-joule shock was delivered which terminated VF and restored sinus rhythm.  No additional defibrillation threshold testing was carried out and the incision was closed with 2-0 and 3-0 Vicryl.  Benzoin and Steri-Strips were painted on the skin.  A pressure dressing was applied, and the patient was returned to her room in satisfactory condition.  COMPLICATIONS:  There were no immediate procedure complications.  RESULTS:  This demonstrated successful removal of her previously implanted BiV ICD which had reached ERI followed by successful insertion of a new device with insertion of a new left ventricular pacing lead.     Doylene Canning. Ladona Ridgel, MD     GWT/MEDQ  D:  06/25/2010  T:  06/26/2010  Job:  [id number]  Electronically Signed by Lewayne Bunting MD on 08/12/2010 08:31:08 AM

## 2010-08-19 ENCOUNTER — Other Ambulatory Visit: Payer: Self-pay | Admitting: Cardiology

## 2010-08-19 MED ORDER — AMIODARONE HCL 200 MG PO TABS: 200.0000 mg | ORAL_TABLET | Freq: Every day | ORAL | Status: DC

## 2010-08-23 ENCOUNTER — Ambulatory Visit (INDEPENDENT_AMBULATORY_CARE_PROVIDER_SITE_OTHER): Payer: Medicare Other | Admitting: *Deleted

## 2010-08-23 DIAGNOSIS — I4891 Unspecified atrial fibrillation: Secondary | ICD-10-CM

## 2010-08-23 LAB — POCT INR: INR: 3.4

## 2010-09-13 ENCOUNTER — Ambulatory Visit (INDEPENDENT_AMBULATORY_CARE_PROVIDER_SITE_OTHER): Payer: Medicare Other | Admitting: *Deleted

## 2010-09-13 DIAGNOSIS — I4891 Unspecified atrial fibrillation: Secondary | ICD-10-CM

## 2010-09-13 LAB — POCT INR: INR: 3.9

## 2010-09-24 ENCOUNTER — Encounter: Payer: Self-pay | Admitting: Internal Medicine

## 2010-09-28 ENCOUNTER — Ambulatory Visit (INDEPENDENT_AMBULATORY_CARE_PROVIDER_SITE_OTHER): Payer: Medicare Other | Admitting: Internal Medicine

## 2010-09-28 ENCOUNTER — Encounter: Payer: Self-pay | Admitting: Internal Medicine

## 2010-09-28 ENCOUNTER — Other Ambulatory Visit: Payer: Self-pay | Admitting: Cardiology

## 2010-09-28 DIAGNOSIS — Z95810 Presence of automatic (implantable) cardiac defibrillator: Secondary | ICD-10-CM

## 2010-09-28 DIAGNOSIS — I428 Other cardiomyopathies: Secondary | ICD-10-CM

## 2010-09-28 DIAGNOSIS — I5022 Chronic systolic (congestive) heart failure: Secondary | ICD-10-CM

## 2010-09-28 DIAGNOSIS — F17200 Nicotine dependence, unspecified, uncomplicated: Secondary | ICD-10-CM

## 2010-09-28 LAB — ICD DEVICE OBSERVATION
AL AMPLITUDE: 2.75 mv
AL IMPEDENCE ICD: 399 Ohm
ATRIAL PACING ICD: 99.73 pct
LV LEAD IMPEDENCE ICD: 494 Ohm
RV LEAD IMPEDENCE ICD: 513 Ohm
RV LEAD THRESHOLD: 0.75 V
TOT-0001: 1
TOT-0002: 0
TOT-0006: 20120601000000
TZAT-0001ATACH: 1
TZAT-0001ATACH: 3
TZAT-0001FASTVT: 1
TZAT-0001SLOWVT: 1
TZAT-0001SLOWVT: 2
TZAT-0002ATACH: NEGATIVE
TZAT-0002ATACH: NEGATIVE
TZAT-0002ATACH: NEGATIVE
TZAT-0002FASTVT: NEGATIVE
TZAT-0005SLOWVT: 88 pct
TZAT-0005SLOWVT: 91 pct
TZAT-0011SLOWVT: 10 ms
TZAT-0012ATACH: 150 ms
TZAT-0013SLOWVT: 3
TZAT-0013SLOWVT: 3
TZAT-0018ATACH: NEGATIVE
TZAT-0018ATACH: NEGATIVE
TZAT-0018SLOWVT: NEGATIVE
TZAT-0018SLOWVT: NEGATIVE
TZAT-0019ATACH: 6 V
TZAT-0019ATACH: 6 V
TZAT-0019FASTVT: 8 V
TZAT-0019SLOWVT: 8 V
TZAT-0019SLOWVT: 8 V
TZAT-0020ATACH: 1.5 ms
TZON-0004SLOWVT: 32
TZON-0005SLOWVT: 16
TZST-0001ATACH: 5
TZST-0001FASTVT: 3
TZST-0001FASTVT: 4
TZST-0001FASTVT: 5
TZST-0001SLOWVT: 4
TZST-0001SLOWVT: 6
TZST-0002ATACH: NEGATIVE
TZST-0002ATACH: NEGATIVE
TZST-0002FASTVT: NEGATIVE
TZST-0002FASTVT: NEGATIVE
TZST-0002FASTVT: NEGATIVE
TZST-0002FASTVT: NEGATIVE
TZST-0003SLOWVT: 35 J
TZST-0003SLOWVT: 35 J
VENTRICULAR PACING ICD: 99.69 pct
VF: 0

## 2010-09-28 MED ORDER — AMIODARONE HCL 200 MG PO TABS: ORAL_TABLET | ORAL | Status: DC

## 2010-09-28 NOTE — Patient Instructions (Signed)
Your physician has recommended you make the following change in your medication: decrease Amiodarone to 200 mg (whole tablet) Monday through Friday and 100 mg (1/2 tablet) on Saturday and Sunday  Your physician recommends that you schedule a follow-up appointment in: 1 year

## 2010-09-29 ENCOUNTER — Ambulatory Visit (INDEPENDENT_AMBULATORY_CARE_PROVIDER_SITE_OTHER): Payer: Medicare Other | Admitting: *Deleted

## 2010-09-29 ENCOUNTER — Encounter: Payer: Self-pay | Admitting: Internal Medicine

## 2010-09-29 DIAGNOSIS — I4891 Unspecified atrial fibrillation: Secondary | ICD-10-CM

## 2010-09-29 NOTE — Assessment & Plan Note (Signed)
Her device is working normally. We'll plan to recheck in several months. 

## 2010-09-29 NOTE — Assessment & Plan Note (Signed)
Her symptoms are well controlled. She will continue her current medical therapy, and maintain a low-sodium diet.

## 2010-09-29 NOTE — Progress Notes (Signed)
HPI [name] returns today for followup. She has a dilated cardiomyopathy, chronic systolic heart failure, left bundle branch block, status post biventricular ICD implantation. She had generator change several months ago. She feels well. She denies fevers or chills. No chest pain. Her congestive heart failure is class 1-2. She is trying to stop smoking. Allergies  Allergen Reactions  . Penicillins      Current Outpatient Prescriptions  Medication Sig Dispense Refill  . acetaminophen (TYLENOL) 500 MG tablet Take 500 mg by mouth as needed.        Marland Kitchen ALDACTONE 25 MG tablet TAKE ONE TABLET BY MOUTH ONCE DAILY.  30 each  0  . amiodarone (PACERONE) 200 MG tablet Dose decrease /Take 1 tablet Monday through Friday and 1/2 tablet on Saturday and Sunday  30 tablet  6  . buPROPion (WELLBUTRIN SR) 100 MG 12 hr tablet Take 100 mg by mouth daily.        . colchicine 0.6 MG tablet Take 0.6 mg by mouth daily.        Marland Kitchen COUMADIN 5 MG tablet TAKE AS DIRECTED.  45 each  1  . digoxin (LANOXIN) 0.125 MG tablet        . enalapril (VASOTEC) 10 MG tablet Take 10 mg by mouth 2 (two) times daily.        Marland Kitchen HYDROcodone-acetaminophen (VICODIN) 5-500 MG per tablet Take 1 tablet by mouth as needed.        Marland Kitchen LASIX 40 MG tablet TAKE 1 TABLET BY MOUTH   TWICE A DAY.  60 each  0  . prednisoLONE acetate (PRED FORTE) 1 % ophthalmic suspension 1 drop as needed.        . COREG 25 MG tablet TAKE 1 TABLET BY MOUTH   TWICE A DAY.  60 each  6     Past Medical History  Diagnosis Date  . Nonischemic cardiomyopathy 1998  . Pulmonary HTN April 2009  . HTN (hypertension)   . Atrial fibrillation   . Nonsustained ventricular tachycardia   . OSA (obstructive sleep apnea)   . CKD (chronic kidney disease)   . Venous stasis   . Gout     ROS:   All systems reviewed and negative except as noted in the HPI.   Past Surgical History  Procedure Date  . Appendectomy 1987  . Incision and drainage intra oral abscess 1991   submandibular abscess  . Cardiac defibrillator placement may 2007    Medtronic insync, Bi [place]  . Cardiac catheterization     March 2007     Family History  Problem Relation Age of Onset  . Heart attack Mother 76  . Kidney failure Father 17  . Heart attack Brother      History   Social History  . Marital Status: Single    Spouse Name: N/A    Number of Children: N/A  . Years of Education: N/A   Occupational History  . Retired    Social History Main Topics  . Smoking status: Current Everyday Smoker -- 0.5 packs/day for 30 years    Types: Cigarettes  . Smokeless tobacco: Never Used  . Alcohol Use: No  . Drug Use: No  . Sexually Active: Not on file   Other Topics Concern  . Not on file   Social History Narrative  . No narrative on file     BP 125/81  Pulse 90  Resp 18  Ht 5\' 7"  (1.702 m)  Wt 200 lb (  90.719 kg)  BMI 31.32 kg/m2  SpO2 96%  Physical Exam:  Well appearing woman, NAD HEENT: Unremarkable Neck:  No JVD, no thyromegally Lymphatics:  No adenopathy Back:  No CVA tenderness Lungs:  Clear. Well-healed ICD incision. HEART:  Regular rate rhythm, no murmurs, no rubs, no clicks Abd:  soft, positive bowel sounds, no organomegally, no rebound, no guarding Ext:  2 plus pulses, no edema, no cyanosis, no clubbing Skin:  No rashes no nodules Neuro:  CN II through XII intact, motor grossly intact  DEVICE  Normal device function.  See PaceArt for details.   Assess/Plan:

## 2010-09-29 NOTE — Assessment & Plan Note (Signed)
I have continued to encourage her to try and stop smoking. She states that she will try by reducing the amount that she smokes on a regular basis. She has not yet decided to stop altogether.

## 2010-10-18 LAB — CBC
HCT: 41.1
MCV: 83.3
Platelets: 231
RDW: 17 — ABNORMAL HIGH

## 2010-10-18 LAB — BASIC METABOLIC PANEL
BUN: 26 — ABNORMAL HIGH
GFR calc non Af Amer: 24 — ABNORMAL LOW
Glucose, Bld: 127 — ABNORMAL HIGH
Potassium: 3.8

## 2010-10-18 LAB — DIFFERENTIAL
Basophils Absolute: 0
Basophils Relative: 1
Eosinophils Absolute: 0.9 — ABNORMAL HIGH
Eosinophils Relative: 14 — ABNORMAL HIGH
Lymphocytes Relative: 24

## 2010-10-18 LAB — CARDIAC PANEL(CRET KIN+CKTOT+MB+TROPI)
CK, MB: 5.7 — ABNORMAL HIGH
Total CK: 157

## 2010-10-18 LAB — POCT CARDIAC MARKERS
CKMB, poc: 2.7
Troponin i, poc: <0.05

## 2010-10-18 LAB — B-NATRIURETIC PEPTIDE (CONVERTED LAB): Pro B Natriuretic peptide (BNP): 2120 — ABNORMAL HIGH

## 2010-10-19 LAB — BASIC METABOLIC PANEL
BUN: 26 — ABNORMAL HIGH
BUN: 26 — ABNORMAL HIGH
BUN: 31 — ABNORMAL HIGH
CO2: 33 — ABNORMAL HIGH
CO2: 39 — ABNORMAL HIGH
CO2: 39 — ABNORMAL HIGH
Calcium: 8.5
Chloride: 101
Chloride: 103
Chloride: 95 — ABNORMAL LOW
Chloride: 97
Creatinine, Ser: 1.92 — ABNORMAL HIGH
Creatinine, Ser: 2.12 — ABNORMAL HIGH
Creatinine, Ser: 2.15 — ABNORMAL HIGH
Creatinine, Ser: 2.18 — ABNORMAL HIGH
GFR calc Af Amer: 27 — ABNORMAL LOW
GFR calc Af Amer: 28 — ABNORMAL LOW
GFR calc Af Amer: 28 — ABNORMAL LOW
GFR calc Af Amer: 33 — ABNORMAL LOW
GFR calc non Af Amer: 23 — ABNORMAL LOW
GFR calc non Af Amer: 23 — ABNORMAL LOW
GFR calc non Af Amer: 27 — ABNORMAL LOW
Glucose, Bld: 89
Glucose, Bld: 90
Glucose, Bld: 91
Glucose, Bld: 94
Potassium: 3.9
Potassium: 4.1
Potassium: 4.3
Potassium: 4.8
Sodium: 139
Sodium: 141

## 2010-10-19 LAB — PROTIME-INR
INR: 1.7 — ABNORMAL HIGH
INR: 2.6 — ABNORMAL HIGH
INR: 3.1 — ABNORMAL HIGH
Prothrombin Time: 29.6 — ABNORMAL HIGH

## 2010-10-19 LAB — B-NATRIURETIC PEPTIDE (CONVERTED LAB): Pro B Natriuretic peptide (BNP): 1350 — ABNORMAL HIGH

## 2010-10-20 ENCOUNTER — Encounter: Payer: Medicare Other | Admitting: *Deleted

## 2010-10-22 ENCOUNTER — Encounter: Payer: Self-pay | Admitting: Cardiology

## 2010-10-25 ENCOUNTER — Encounter: Payer: Self-pay | Admitting: Cardiology

## 2010-10-25 ENCOUNTER — Ambulatory Visit (INDEPENDENT_AMBULATORY_CARE_PROVIDER_SITE_OTHER): Payer: Medicare Other | Admitting: *Deleted

## 2010-10-25 ENCOUNTER — Ambulatory Visit (INDEPENDENT_AMBULATORY_CARE_PROVIDER_SITE_OTHER): Payer: Medicare Other | Admitting: Cardiology

## 2010-10-25 DIAGNOSIS — Z7901 Long term (current) use of anticoagulants: Secondary | ICD-10-CM | POA: Insufficient documentation

## 2010-10-25 DIAGNOSIS — I4891 Unspecified atrial fibrillation: Secondary | ICD-10-CM

## 2010-10-25 DIAGNOSIS — M109 Gout, unspecified: Secondary | ICD-10-CM | POA: Insufficient documentation

## 2010-10-25 DIAGNOSIS — Z95810 Presence of automatic (implantable) cardiac defibrillator: Secondary | ICD-10-CM

## 2010-10-25 DIAGNOSIS — I10 Essential (primary) hypertension: Secondary | ICD-10-CM

## 2010-10-25 DIAGNOSIS — I428 Other cardiomyopathies: Secondary | ICD-10-CM

## 2010-10-25 DIAGNOSIS — N184 Chronic kidney disease, stage 4 (severe): Secondary | ICD-10-CM

## 2010-10-25 LAB — POCT INR: INR: 1.9

## 2010-10-25 NOTE — Assessment & Plan Note (Deleted)
Blood pressure control is excellent; current medications will be continued. 

## 2010-10-25 NOTE — Assessment & Plan Note (Addendum)
Atrial fibrillation can best be monitored via interrogation of the patient's device, which is being followed by Dr. Ladona Ridgel.  She has had no clinical evidence for recurrent atrial fibrillation on amiodarone, and likely no evidence of an arrhythmia recorded by her device, as Dr. Ladona Ridgel recently reduced her dose of amiodarone.  Continued anticoagulation is appropriate.  If she continues to be free of recurrent arrhythmia, some of her rate control medication, particularly digoxin, can be discontinued.

## 2010-10-25 NOTE — Patient Instructions (Signed)
Your physician recommends that you schedule a follow-up appointment in: 9 months You have been referred to a Dietician for renal, cardiac and Weight reduction diet. Your physician recommends that you return for lab work in: this week

## 2010-10-25 NOTE — Assessment & Plan Note (Signed)
Patient has done remarkably well in the face of severely impaired left ventricular systolic function for nearly 15 years.  We will continue to provide optimal therapy.

## 2010-10-25 NOTE — Assessment & Plan Note (Signed)
Blood pressure control is excellent; current medications will be continued. 

## 2010-10-25 NOTE — Assessment & Plan Note (Signed)
Patient was recently found to have a uric acid level is 13.2.  She has done well on treatment with low-dose colchicine, but allopurinol 100 mg b.i.d was added to her medical regime.  Dr. Kristian Covey will monitor the effectiveness of that intervention.

## 2010-10-25 NOTE — Assessment & Plan Note (Signed)
Patient has done generally well with Coumadin therapy.  We will verify a normal CBC and monitor stool Hemoccults.

## 2010-10-25 NOTE — Progress Notes (Signed)
HPI : Ms. Plaza returns to the office for continued assessment and treatment of nonischemic cardiomyopathy and paroxysmal atrial fibrillation.  Despite severely impaired left ventricular systolic function documented for the past 15 years, patient has done exceedingly well.  She has an AICD/biventricular pacing device, but has never required defibrillation.  She does most of her housework, climbs stairs and walk substantial distances without any cardiopulmonary symptoms.  She is concerned about excessive weight and seeks information regarding appropriate dietary measures for her multiple medical problems as well as for weight reduction.  Current Outpatient Prescriptions on File Prior to Visit  Medication Sig Dispense Refill  . acetaminophen (TYLENOL) 500 MG tablet Take 500 mg by mouth as needed.        Marland Kitchen ALDACTONE 25 MG tablet TAKE ONE TABLET BY MOUTH ONCE DAILY.  30 each  0  . amiodarone (PACERONE) 200 MG tablet Dose decrease /Take 1 tablet Monday through Friday and 1/2 tablet on Saturday and Sunday  30 tablet  6  . buPROPion (WELLBUTRIN SR) 100 MG 12 hr tablet Take 100 mg by mouth daily.        . colchicine 0.6 MG tablet Take 0.6 mg by mouth daily.        Marland Kitchen COREG 25 MG tablet TAKE 1 TABLET BY MOUTH   TWICE A DAY.  60 each  6  . COUMADIN 5 MG tablet TAKE AS DIRECTED.  45 each  1  . digoxin (LANOXIN) 0.125 MG tablet        . enalapril (VASOTEC) 10 MG tablet Take 10 mg by mouth 2 (two) times daily.        Marland Kitchen HYDROcodone-acetaminophen (VICODIN) 5-500 MG per tablet Take 1 tablet by mouth as needed.        Marland Kitchen LASIX 40 MG tablet TAKE 1 TABLET BY MOUTH   TWICE A DAY.  60 each  0  . prednisoLONE acetate (PRED FORTE) 1 % ophthalmic suspension 1 drop as needed.           Allergies  Allergen Reactions  . Penicillins       Past medical history, social history, and family history reviewed and updated.  ROS: Denies chest discomfort, dyspnea, orthopnea, PND, lightheadedness, palpitations or  syncope.  PHYSICAL EXAM: BP 110/76  Pulse 63  Resp 18  Ht 5\' 6"  (1.676 m)  Wt 205 lb 6.4 oz (93.169 kg)  BMI 33.15 kg/m2  SpO2 99%  General-Well developed; no acute distress Body habitus-overweight Neck-No JVD; no carotid bruits Lungs-clear lung fields; resonant to percussion Cardiovascular-normal PMI; normal S1 and S2 Abdomen-normal bowel sounds; soft and non-tender without masses or organomegaly Musculoskeletal-No deformities, no cyanosis or clubbing Neurologic-Normal cranial nerves; symmetric strength and tone Skin-Warm, no significant lesions Extremities-distal pulses intact; no edema  EKG: AV sequential pacing; PVCs, frequent at times.  No previous tracings for comparison.  ASSESSMENT AND PLAN:

## 2010-10-26 ENCOUNTER — Other Ambulatory Visit: Payer: Self-pay | Admitting: Cardiology

## 2010-10-26 ENCOUNTER — Encounter: Payer: Self-pay | Admitting: Cardiology

## 2010-10-26 NOTE — Assessment & Plan Note (Addendum)
Patient referred to dietitian for establishment and maintenance of a renal diet, which will also be appropriate for management of cardiac disease and for weight reduction.

## 2010-10-28 ENCOUNTER — Encounter: Payer: Self-pay | Admitting: Cardiology

## 2010-10-29 LAB — CBC WITH DIFFERENTIAL/PLATELET
Basophils Absolute: 0 10*3/uL (ref 0.0–0.1)
Basophils Relative: 1 % (ref 0–1)
HCT: 43 % (ref 36.0–46.0)
Hemoglobin: 13.1 g/dL (ref 12.0–15.0)
Lymphocytes Relative: 35 % (ref 12–46)
MCHC: 30.5 g/dL (ref 30.0–36.0)
Monocytes Absolute: 0.4 10*3/uL (ref 0.1–1.0)
Neutro Abs: 2.6 10*3/uL (ref 1.7–7.7)
Neutrophils Relative %: 47 % (ref 43–77)
RDW: 15.1 % (ref 11.5–15.5)
WBC: 5.5 10*3/uL (ref 4.0–10.5)

## 2010-10-29 LAB — LIPID PANEL
Cholesterol: 175 mg/dL (ref 0–200)
HDL: 49 mg/dL (ref >39)
LDL Cholesterol: 107 mg/dL — ABNORMAL HIGH (ref 0–99)
Triglycerides: 94 mg/dL (ref <150)

## 2010-11-11 ENCOUNTER — Encounter: Payer: Self-pay | Admitting: Cardiology

## 2010-11-15 ENCOUNTER — Ambulatory Visit (INDEPENDENT_AMBULATORY_CARE_PROVIDER_SITE_OTHER): Payer: Medicare Other | Admitting: *Deleted

## 2010-11-15 DIAGNOSIS — I4891 Unspecified atrial fibrillation: Secondary | ICD-10-CM

## 2010-11-15 DIAGNOSIS — Z7901 Long term (current) use of anticoagulants: Secondary | ICD-10-CM

## 2010-12-13 ENCOUNTER — Encounter: Payer: Medicare Other | Admitting: *Deleted

## 2010-12-15 ENCOUNTER — Ambulatory Visit (INDEPENDENT_AMBULATORY_CARE_PROVIDER_SITE_OTHER): Payer: Medicare Other | Admitting: *Deleted

## 2010-12-15 DIAGNOSIS — I4891 Unspecified atrial fibrillation: Secondary | ICD-10-CM

## 2010-12-15 DIAGNOSIS — Z7901 Long term (current) use of anticoagulants: Secondary | ICD-10-CM

## 2010-12-26 ENCOUNTER — Encounter (HOSPITAL_COMMUNITY): Payer: Self-pay | Admitting: *Deleted

## 2010-12-26 ENCOUNTER — Inpatient Hospital Stay (HOSPITAL_COMMUNITY): Payer: Medicare Other

## 2010-12-26 ENCOUNTER — Emergency Department (HOSPITAL_COMMUNITY): Payer: Medicare Other

## 2010-12-26 ENCOUNTER — Inpatient Hospital Stay (HOSPITAL_COMMUNITY)
Admission: EM | Admit: 2010-12-26 | Discharge: 2010-12-28 | DRG: 554 | Disposition: A | Discharge status: Home / Self Care | Payer: Medicare Other | Attending: Internal Medicine | Admitting: Internal Medicine

## 2010-12-26 DIAGNOSIS — I509 Heart failure, unspecified: Secondary | ICD-10-CM | POA: Diagnosis present

## 2010-12-26 DIAGNOSIS — A5901 Trichomonal vulvovaginitis: Secondary | ICD-10-CM | POA: Diagnosis present

## 2010-12-26 DIAGNOSIS — E860 Dehydration: Secondary | ICD-10-CM | POA: Diagnosis present

## 2010-12-26 DIAGNOSIS — M1990 Unspecified osteoarthritis, unspecified site: Secondary | ICD-10-CM

## 2010-12-26 DIAGNOSIS — I428 Other cardiomyopathies: Secondary | ICD-10-CM | POA: Diagnosis present

## 2010-12-26 DIAGNOSIS — N19 Unspecified kidney failure: Secondary | ICD-10-CM

## 2010-12-26 DIAGNOSIS — N390 Urinary tract infection, site not specified: Secondary | ICD-10-CM | POA: Diagnosis present

## 2010-12-26 DIAGNOSIS — Z95810 Presence of automatic (implantable) cardiac defibrillator: Secondary | ICD-10-CM

## 2010-12-26 DIAGNOSIS — M549 Dorsalgia, unspecified: Secondary | ICD-10-CM | POA: Diagnosis present

## 2010-12-26 DIAGNOSIS — E871 Hypo-osmolality and hyponatremia: Secondary | ICD-10-CM | POA: Diagnosis present

## 2010-12-26 DIAGNOSIS — N184 Chronic kidney disease, stage 4 (severe): Secondary | ICD-10-CM | POA: Diagnosis present

## 2010-12-26 DIAGNOSIS — I5022 Chronic systolic (congestive) heart failure: Secondary | ICD-10-CM | POA: Diagnosis present

## 2010-12-26 DIAGNOSIS — G4730 Sleep apnea, unspecified: Secondary | ICD-10-CM

## 2010-12-26 DIAGNOSIS — R269 Unspecified abnormalities of gait and mobility: Secondary | ICD-10-CM | POA: Diagnosis present

## 2010-12-26 DIAGNOSIS — I2589 Other forms of chronic ischemic heart disease: Secondary | ICD-10-CM | POA: Diagnosis present

## 2010-12-26 DIAGNOSIS — Z7901 Long term (current) use of anticoagulants: Secondary | ICD-10-CM

## 2010-12-26 DIAGNOSIS — I10 Essential (primary) hypertension: Secondary | ICD-10-CM | POA: Diagnosis present

## 2010-12-26 DIAGNOSIS — I129 Hypertensive chronic kidney disease with stage 1 through stage 4 chronic kidney disease, or unspecified chronic kidney disease: Secondary | ICD-10-CM | POA: Diagnosis present

## 2010-12-26 DIAGNOSIS — I4891 Unspecified atrial fibrillation: Secondary | ICD-10-CM | POA: Diagnosis present

## 2010-12-26 DIAGNOSIS — M109 Gout, unspecified: Principal | ICD-10-CM | POA: Diagnosis present

## 2010-12-26 HISTORY — DX: Heart failure, unspecified: I50.9

## 2010-12-26 HISTORY — DX: Reserved for concepts with insufficient information to code with codable children: IMO0002

## 2010-12-26 HISTORY — DX: Systemic lupus erythematosus, unspecified: M32.9

## 2010-12-26 HISTORY — DX: Chronic kidney disease, unspecified: N18.9

## 2010-12-26 LAB — DIFFERENTIAL
Basophils Relative: 0 % (ref 0–1)
Eosinophils Absolute: 0.1 10*3/uL (ref 0.0–0.7)
Lymphs Abs: 1.7 10*3/uL (ref 0.7–4.0)
Monocytes Absolute: 1.3 10*3/uL — ABNORMAL HIGH (ref 0.1–1.0)
Monocytes Relative: 10 % (ref 3–12)
Neutrophils Relative %: 77 % (ref 43–77)

## 2010-12-26 LAB — BASIC METABOLIC PANEL
BUN: 56 mg/dL — ABNORMAL HIGH (ref 6–23)
CO2: 27 mEq/L (ref 19–32)
Chloride: 89 mEq/L — ABNORMAL LOW (ref 96–112)
Glucose, Bld: 94 mg/dL (ref 70–99)
Potassium: 4.1 mEq/L (ref 3.5–5.1)
Sodium: 125 mEq/L — ABNORMAL LOW (ref 135–145)

## 2010-12-26 LAB — CBC
HCT: 34.9 % — ABNORMAL LOW (ref 36.0–46.0)
Hemoglobin: 11.1 g/dL — ABNORMAL LOW (ref 12.0–15.0)
MCH: 28.2 pg (ref 26.0–34.0)
MCHC: 31.8 g/dL (ref 30.0–36.0)
MCV: 88.8 fL (ref 78.0–100.0)
Platelets: 454 10*3/uL — ABNORMAL HIGH (ref 150–400)
RBC: 3.93 MIL/uL (ref 3.87–5.11)
RDW: 15.2 % (ref 11.5–15.5)
WBC: 13.8 10*3/uL — ABNORMAL HIGH (ref 4.0–10.5)

## 2010-12-26 LAB — URINALYSIS, ROUTINE W REFLEX MICROSCOPIC
Bilirubin Urine: NEGATIVE
Glucose, UA: NEGATIVE mg/dL
Ketones, ur: NEGATIVE mg/dL
Nitrite: POSITIVE — AB
Protein, ur: NEGATIVE mg/dL
Specific Gravity, Urine: 1.01 (ref 1.005–1.030)
Urobilinogen, UA: 0.2 mg/dL (ref 0.0–1.0)
pH: 6 (ref 5.0–8.0)

## 2010-12-26 LAB — URINE MICROSCOPIC-ADD ON

## 2010-12-26 LAB — MAGNESIUM: Magnesium: 2.2 mg/dL (ref 1.5–2.5)

## 2010-12-26 LAB — PROTIME-INR: INR: 1.48 (ref 0.00–1.49)

## 2010-12-26 LAB — HEPATIC FUNCTION PANEL
Indirect Bilirubin: 0.3 mg/dL (ref 0.3–0.9)
Total Protein: 8.3 g/dL (ref 6.0–8.3)

## 2010-12-26 LAB — DIGOXIN LEVEL: Digoxin Level: 1.9 ng/mL (ref 0.8–2.0)

## 2010-12-26 MED ORDER — TRAMADOL HCL 50 MG PO TABS: 25.0000 mg | ORAL_TABLET | Freq: Four times a day (QID) | ORAL | Status: DC | PRN

## 2010-12-26 MED ORDER — NITROFURANTOIN MONOHYD MACRO 100 MG PO CAPS
100.0000 mg | ORAL_CAPSULE | Freq: Once | ORAL | Status: AC
  Administered 2010-12-26: 100 mg via ORAL
  Filled 2010-12-26: qty 1

## 2010-12-26 MED ORDER — CARVEDILOL 12.5 MG PO TABS
25.0000 mg | ORAL_TABLET | Freq: Two times a day (BID) | ORAL | Status: DC
  Administered 2010-12-27 – 2010-12-28 (×2): 25 mg via ORAL
  Filled 2010-12-26 (×2): qty 2

## 2010-12-26 MED ORDER — BUPROPION HCL ER (SR) 100 MG PO TB12
100.0000 mg | ORAL_TABLET | Freq: Every day | ORAL | Status: DC
  Administered 2010-12-27 – 2010-12-28 (×2): 100 mg via ORAL
  Filled 2010-12-26 (×3): qty 1

## 2010-12-26 MED ORDER — DIGOXIN 125 MCG PO TABS
125.0000 ug | ORAL_TABLET | ORAL | Status: DC
  Administered 2010-12-27 – 2010-12-28 (×2): 125 ug via ORAL
  Filled 2010-12-26 (×2): qty 1

## 2010-12-26 MED ORDER — FUROSEMIDE 40 MG PO TABS
40.0000 mg | ORAL_TABLET | Freq: Two times a day (BID) | ORAL | Status: DC
  Administered 2010-12-27 – 2010-12-28 (×3): 40 mg via ORAL
  Filled 2010-12-26 (×3): qty 1

## 2010-12-26 MED ORDER — COLCHICINE 0.6 MG PO TABS: 0.6000 mg | ORAL_TABLET | Freq: Every day | ORAL | Status: DC

## 2010-12-26 MED ORDER — NITROFURANTOIN MONOHYD MACRO 100 MG PO CAPS
ORAL_CAPSULE | ORAL | Status: AC
  Filled 2010-12-26: qty 1

## 2010-12-26 MED ORDER — AMIODARONE HCL 200 MG PO TABS
200.0000 mg | ORAL_TABLET | ORAL | Status: DC
  Administered 2010-12-27 – 2010-12-28 (×2): 200 mg via ORAL
  Filled 2010-12-26 (×2): qty 1

## 2010-12-26 MED ORDER — METHYLPREDNISOLONE SODIUM SUCC 40 MG IJ SOLR
40.0000 mg | Freq: Four times a day (QID) | INTRAMUSCULAR | Status: DC
  Administered 2010-12-26 – 2010-12-27 (×3): 40 mg via INTRAVENOUS
  Filled 2010-12-26 (×3): qty 1

## 2010-12-26 MED ORDER — AMIODARONE HCL 200 MG PO TABS
100.0000 mg | ORAL_TABLET | ORAL | Status: DC
  Administered 2010-12-27: 100 mg via ORAL
  Filled 2010-12-26 (×2): qty 1

## 2010-12-26 MED ORDER — ALLOPURINOL 100 MG PO TABS
100.0000 mg | ORAL_TABLET | Freq: Two times a day (BID) | ORAL | Status: DC
  Administered 2010-12-27 – 2010-12-28 (×4): 100 mg via ORAL
  Filled 2010-12-26 (×4): qty 1

## 2010-12-26 MED ORDER — HYDROMORPHONE HCL PF 1 MG/ML IJ SOLN: 0.5000 mg | INTRAMUSCULAR | Status: DC | PRN

## 2010-12-26 MED ORDER — COLCHICINE 0.6 MG PO TABS
0.6000 mg | ORAL_TABLET | Freq: Three times a day (TID) | ORAL | Status: AC
  Administered 2010-12-27 (×3): 0.6 mg via ORAL
  Filled 2010-12-26 (×3): qty 1

## 2010-12-26 MED ORDER — SODIUM CHLORIDE 0.9 % IV BOLUS (SEPSIS)
500.0000 mL | Freq: Once | INTRAVENOUS | Status: AC
  Administered 2010-12-26: 500 mL via INTRAVENOUS

## 2010-12-26 MED ORDER — ACETAMINOPHEN 650 MG RE SUPP: 650.0000 mg | Freq: Four times a day (QID) | RECTAL | Status: DC | PRN

## 2010-12-26 MED ORDER — ONDANSETRON HCL 4 MG/2ML IJ SOLN: 4.0000 mg | Freq: Four times a day (QID) | INTRAMUSCULAR | Status: DC | PRN

## 2010-12-26 MED ORDER — ASPIRIN EC 81 MG PO TBEC
81.0000 mg | DELAYED_RELEASE_TABLET | Freq: Every day | ORAL | Status: DC
  Administered 2010-12-27 – 2010-12-28 (×2): 81 mg via ORAL
  Filled 2010-12-26 (×2): qty 1

## 2010-12-26 MED ORDER — OXYCODONE HCL 5 MG PO TABS: 5.0000 mg | ORAL_TABLET | Freq: Four times a day (QID) | ORAL | Status: DC | PRN

## 2010-12-26 MED ORDER — METHOCARBAMOL 500 MG PO TABS: 500.0000 mg | ORAL_TABLET | Freq: Three times a day (TID) | ORAL | Status: DC | PRN

## 2010-12-26 MED ORDER — ONDANSETRON HCL 4 MG PO TABS: 4.0000 mg | ORAL_TABLET | Freq: Four times a day (QID) | ORAL | Status: DC | PRN

## 2010-12-26 MED ORDER — SPIRONOLACTONE 25 MG PO TABS
12.5000 mg | ORAL_TABLET | Freq: Every day | ORAL | Status: DC
  Administered 2010-12-27 – 2010-12-28 (×2): 12.5 mg via ORAL
  Filled 2010-12-26 (×2): qty 1

## 2010-12-26 MED ORDER — ACETAMINOPHEN 325 MG PO TABS: 650.0000 mg | ORAL_TABLET | Freq: Four times a day (QID) | ORAL | Status: DC | PRN

## 2010-12-26 MED ORDER — SODIUM CHLORIDE 0.9 % IV SOLN
Freq: Once | INTRAVENOUS | Status: AC
  Administered 2010-12-26: 21:00:00 via INTRAVENOUS

## 2010-12-26 MED ORDER — ENALAPRIL MALEATE 5 MG PO TABS
10.0000 mg | ORAL_TABLET | Freq: Two times a day (BID) | ORAL | Status: DC
  Administered 2010-12-27 – 2010-12-28 (×4): 10 mg via ORAL
  Filled 2010-12-26: qty 1
  Filled 2010-12-26 (×3): qty 2

## 2010-12-26 MED ORDER — TRAZODONE HCL 50 MG PO TABS: 25.0000 mg | ORAL_TABLET | Freq: Every evening | ORAL | Status: DC | PRN

## 2010-12-26 MED ORDER — COLCHICINE 0.6 MG PO TABS
0.6000 mg | ORAL_TABLET | Freq: Two times a day (BID) | ORAL | Status: AC
  Administered 2010-12-27 – 2010-12-28 (×2): 0.6 mg via ORAL
  Filled 2010-12-26 (×2): qty 1

## 2010-12-26 MED ORDER — AMIODARONE HCL 200 MG PO TABS: 200.0000 mg | ORAL_TABLET | ORAL | Status: DC

## 2010-12-26 MED ORDER — IBUPROFEN 400 MG PO TABS
400.0000 mg | ORAL_TABLET | Freq: Once | ORAL | Status: AC
  Administered 2010-12-26: 400 mg via ORAL
  Filled 2010-12-26: qty 1

## 2010-12-26 NOTE — ED Notes (Signed)
AC called to bring pt medication.

## 2010-12-26 NOTE — ED Notes (Signed)
Pt states that she is unable to stand and walk. Pt states that she has been bed ridden for over a month.

## 2010-12-26 NOTE — H&P (Addendum)
PCP:   Dwana Melena, MD   Chief Complaint:  Joint pains and difficulty walking for 3 weeks   HPI: [name] [name] is an 70 y.o. African American female.  Remote history of lupus of the skin 2 years ago, causing extensive alopecia, and skin discoloration; stage IV chronic kidney disease; history of hyperuricemia and gout; osteoarthritis; H1 fibrillation on Coumadin; NI cardiomyopathy with systolic heart failure. Status post AICD placement.  Has been seeing her doctor because of chronic pain and inability to move around because of pain; x-rays are recommended but because patient was tacking and mobility she wasn't able to get the x-rays on to receive she decided to call EMS and persuade him to take her to the emergency room to get x-rays. The emergency room patient was noted to be dehydrated and weak and the hospitalist service was called to assist with management of her dehydration and weakness, persistent pain and inability to walk.  She reports the pain is particularly in her right leg, but is also having problems with the right wrist, and sometimes both ankles are giving her problems, there has been some changes in her gout medications but she's not very clear on what these changes are. She does not believe she's ever seen a rheumatologist.  40- 50 pound weight loss over the past the yr; denies GI symptoms   Rewiew of Systems:  The patient denies anorexia, fever, vision loss, decreased hearing, hoarseness, chest pain, syncope, dyspnea on exertion, peripheral edema, balance deficits, hemoptysis, abdominal pain, melena, hematochezia, severe indigestion/heartburn, hematuria, incontinence, genital sores,  suspicious skin lesions, transient blindness, depression, , abnormal bleeding, enlarged lymph nodes, angioedema, and breast masses.    Past Medical History  Diagnosis Date  . Nonischemic cardiomyopathy 1998    EF of 25% in 1998, 15% in 2006; catheterization in 3/07-50% D1; luminal       irregularities in the other vessels; PA-65/25. 20% EF in 04/2007 with moderate MR and pulmonary htn; AICD/biventricular pacing--05/2005  . Hypertension     normal CBC, TSH in 03/2009  . Atrial fibrillation     plus nonsustained ventricular tachycardia --> amiodarone treatment  . Nonsustained ventricular tachycardia   . OSA (obstructive sleep apnea)   . Chronic kidney disease, stage 4, severely decreased GFR     Creatinine of 2.1 in 09/2008, 1.9 in 11/1998 and, 2.3 in 05/2010  . Venous stasis   . Gout     Uric acid of 12.5 in 03/2009  . Chronic anticoagulation   . History of lupus   . CHF (congestive heart failure)   . Chronic kidney disease     Past Surgical History  Procedure Date  . Appendectomy 1987  . Incision and drainage intra oral abscess 1991    submandibular abscess  . Cardiac defibrillator placement 05/2005    Medtronic Insync, Biventricular    Medications:  HOME MEDS: Prior to Admission medications   Medication Sig Start Date End Date Taking? Authorizing Provider  acetaminophen (TYLENOL) 500 MG tablet Take 500 mg by mouth as needed. For pain   Yes Historical Provider, MD  ALDACTONE 25 MG tablet TAKE ONE TABLET BY MOUTH ONCE DAILY. 04/24/10  Yes Gerrit Friends. Rothbart, MD  allopurinol (ZYLOPRIM) 100 MG tablet Take 100 mg by mouth 2 (two) times daily.     Yes Historical Provider, MD  amiodarone (PACERONE) 200 MG tablet Take by mouth daily. Dose decrease /Take 1 tablet Monday through Friday and 1/2 tablet on Saturday and Sunday  09/28/10  Yes  Lewayne Bunting, MD  aspirin EC 81 MG tablet Take 81 mg by mouth daily.     Yes Historical Provider, MD  buPROPion (WELLBUTRIN SR) 100 MG 12 hr tablet Take 100 mg by mouth daily.     Yes Historical Provider, MD  COREG 25 MG tablet TAKE 1 TABLET BY MOUTH   TWICE A DAY. 09/28/10  Yes Gerrit Friends. Rothbart, MD  digoxin (LANOXIN) 0.125 MG tablet Take 125 mcg by mouth every morning.  05/22/10  Yes Joni Reining, NP  enalapril (VASOTEC) 10 MG tablet Take  10 mg by mouth 2 (two) times daily.     Yes Historical Provider, MD  HYDROcodone-acetaminophen (VICODIN) 5-500 MG per tablet Take 1 tablet by mouth daily as needed. For pain   Yes Historical Provider, MD  LASIX 40 MG tablet TAKE 1 TABLET BY MOUTH   TWICE A DAY. 04/24/10  Yes Gerrit Friends. Rothbart, MD  methocarbamol (ROBAXIN) 500 MG tablet Take 500 mg by mouth every 8 (eight) hours as needed. For muscle spasms    Yes Historical Provider, MD  traMADol (ULTRAM) 50 MG tablet Take 25-50 mg by mouth every 8 (eight) hours as needed. Maximum dose= 8 tablets per day for pain    Yes Historical Provider, MD  warfarin (COUMADIN) 5 MG tablet   10/26/10  Yes Gerrit Friends. Dietrich Pates, MD     Allergies:  Allergies  Allergen Reactions  . Penicillins Other (See Comments)    Stiffness    Social History:   reports that she has quit smoking. She has never used smokeless tobacco. She reports that she does not drink alcohol or use illicit drugs.  Family History: Family History  Problem Relation Age of Onset  . Heart attack Mother 68  . Kidney failure Father 61  . Heart attack Brother      Physical Exam: Filed Vitals:   12/26/10 1638 12/26/10 2002 12/26/10 2204 12/26/10 2301  BP: 91/57 133/66 91/46 118/70  Pulse: 71 87 76 71  Temp: 97.2 F (36.2 C)  97.7 F (36.5 C) 98 F (36.7 C)  TempSrc: Oral  Oral Oral  Resp: 16 18 18 18   Height: 5\' 8"  (1.727 m)   5\' 8"  (1.727 m)  Weight: 81.647 kg (180 lb)   88.1 kg (194 lb 3.6 oz)  SpO2: 97% 96% 95% 86%   Blood pressure 118/70, pulse 71, temperature 98 F (36.7 C), temperature source Oral, resp. rate 18, height 5\' 8"  (1.727 m), weight 88.1 kg (194 lb 3.6 oz), SpO2 86.00%.  GEN: Very pleasant elderly African American lady , but unfortunately not a very focused historian, probably because of discomfort. She is very uncomfortable in the stretcher, and is reluctant to adjust her position often her side because of pain. PSYCH:  alert and oriented x4; does not appear  anxious does not appear depressed;  HEENT: Mucous membranes pink and anicteric; PERRLA; EOM intact; no cervical lymphadenopathy nor thyromegaly or carotid bruit; no JVD; alopecia and hypopigmentation of the central areas of her scalp about 8 inches diameter Breasts:: Not examined CHEST WALL: No tenderness CHEST: Normal respiration, clear to auscultation bilaterally. AICD noted left chest HEART: Regular rate and rhythm; no murmurs rubs or gallops BACK: No kyphosis or scoliosis; no CVA tenderness; tender over the upper sacrum ABDOMEN: Obese, soft non-tender; no masses, no organomegaly, normal abdominal bowel sounds; no pannus; no intertriginous candida. Patient examined lying on her right side. Rectal Exam: Not done EXTREMITIES: Deformity and disorganization of the right wrist; marked swelling  and redness of the right ankle extending up to the mid leg, no tenderness of the left ankle;  Genitalia: not examined PULSES: 2+ and symmetric SKIN: Depletion of pigmentation of an area the upper lip; similar to the appearance of her scalp no ulceration CNS: Cranial nerves 2-12 grossly intact no focal neurologic deficit; gait not tested   Labs & Imaging Results for orders placed during the hospital encounter of 12/26/10 (from the past 48 hour(s))  BASIC METABOLIC PANEL     Status: Abnormal   Collection Time   12/26/10  6:36 PM      Component Value Range Comment   Sodium 125 (*) 135 - 145 (mEq/L)    Potassium 4.1  3.5 - 5.1 (mEq/L)    Chloride 89 (*) 96 - 112 (mEq/L)    CO2 27  19 - 32 (mEq/L)    Glucose, Bld 94  70 - 99 (mg/dL)    BUN 56 (*) 6 - 23 (mg/dL)    Creatinine, Ser 8.11 (*) 0.50 - 1.10 (mg/dL)    Calcium 9.9  8.4 - 10.5 (mg/dL)    GFR calc non Af Amer 17 (*) >90 (mL/min)    GFR calc Af Amer 20 (*) >90 (mL/min)   CBC     Status: Abnormal   Collection Time   12/26/10  6:36 PM      Component Value Range Comment   WBC 13.8 (*) 4.0 - 10.5 (K/uL)    RBC 3.93  3.87 - 5.11 (MIL/uL)     Hemoglobin 11.1 (*) 12.0 - 15.0 (g/dL)    HCT 91.4 (*) 78.2 - 46.0 (%)    MCV 88.8  78.0 - 100.0 (fL)    MCH 28.2  26.0 - 34.0 (pg)    MCHC 31.8  30.0 - 36.0 (g/dL)    RDW 95.6  21.3 - 08.6 (%)    Platelets 454 (*) 150 - 400 (K/uL)   DIFFERENTIAL     Status: Abnormal   Collection Time   12/26/10  6:36 PM      Component Value Range Comment   Neutrophils Relative 77  43 - 77 (%)    Neutro Abs 10.7 (*) 1.7 - 7.7 (K/uL)    Lymphocytes Relative 12  12 - 46 (%)    Lymphs Abs 1.7  0.7 - 4.0 (K/uL)    Monocytes Relative 10  3 - 12 (%)    Monocytes Absolute 1.3 (*) 0.1 - 1.0 (K/uL)    Eosinophils Relative 1  0 - 5 (%)    Eosinophils Absolute 0.1  0.0 - 0.7 (K/uL)    Basophils Relative 0  0 - 1 (%)    Basophils Absolute 0.0  0.0 - 0.1 (K/uL)   PROTIME-INR     Status: Abnormal   Collection Time   12/26/10  6:36 PM      Component Value Range Comment   Prothrombin Time 18.2 (*) 11.6 - 15.2 (seconds)    INR 1.48  0.00 - 1.49    DIGOXIN LEVEL     Status: Normal   Collection Time   12/26/10  6:36 PM      Component Value Range Comment   Digoxin Level 1.9  0.8 - 2.0 (ng/mL)   URINALYSIS, ROUTINE W REFLEX MICROSCOPIC     Status: Abnormal   Collection Time   12/26/10  6:44 PM      Component Value Range Comment   Color, Urine YELLOW  YELLOW     APPearance CLEAR  CLEAR  Specific Gravity, Urine 1.010  1.005 - 1.030     pH 6.0  5.0 - 8.0     Glucose, UA NEGATIVE  NEGATIVE (mg/dL)    Hgb urine dipstick MODERATE (*) NEGATIVE     Bilirubin Urine NEGATIVE  NEGATIVE     Ketones, ur NEGATIVE  NEGATIVE (mg/dL)    Protein, ur NEGATIVE  NEGATIVE (mg/dL)    Urobilinogen, UA 0.2  0.0 - 1.0 (mg/dL)    Nitrite POSITIVE (*) NEGATIVE     Leukocytes, UA MODERATE (*) NEGATIVE    URINE MICROSCOPIC-ADD ON     Status: Abnormal   Collection Time   12/26/10  6:44 PM      Component Value Range Comment   Squamous Epithelial / LPF MANY (*) RARE     WBC, UA 11-20  <3 (WBC/hpf)    RBC / HPF 7-10  <3 (RBC/hpf)     Bacteria, UA MANY (*) RARE     Urine-Other TRICHOMONAS PRESENT   CLUE CELLS PRESENT   Dg Lumbar Spine Complete  12/26/2010  *RADIOLOGY REPORT*  Clinical Data: Back pain.  LUMBAR SPINE - COMPLETE 4+ VIEW  Comparison: None  Findings: The lumbar vertebral bodies are normally aligned.  No acute fracture or destructive bony changes.  The facets are normally aligned.  Moderate facet disease but no definite pars defects.  Aortic calcifications are noted without definite aneurysm.  The visualized bony pelvis is intact.  IMPRESSION: Degenerative changes but no acute bony findings.  Original Report Authenticated By: P. Loralie Champagne, M.D.      Assessment Present on Admission:  .Gait abnormality; .UTI (lower urinary tract infection) .Back pain .Dehydration .Hyponatremia .Chronic kidney disease, stage 4, severely decreased GFR .Atrial fibrillation .Gout .Hypertension .Nonischemic cardiomyopathy .SYSTOLIC HEART FAILURE, CHRONIC   PLAN: Within the we'll admit for evaluation of the cause of her acute arthritis which is causing her gait abnormality; we'll x-ray both ankles and feet; and she may benefit with a consult a rheumatologist or orthopedic surgeon; Check uric acid acid level, and start her cultures seen taper; will start her on steroids which can be later tapered. Bili history of lupus will do rheumatology workup in her problems are complicated by lupus arthritis  We'll hydrate her as her creatinine seems a little above baseline; I will get an anemia panel to see if there are complicating factors to of her anemia  Will have pharmacy manage her Coumadin;  We'll be cautious with hydration because of her severe systolic heart failure  Other plans as per orders.    Devere Brem 12/26/2010, 11:17 PM

## 2010-12-26 NOTE — ED Notes (Signed)
Lab called w/ question on urine. Advised lab unable to say who obtained & run as labled

## 2010-12-26 NOTE — ED Notes (Signed)
Pt came in by [facility]; pt states her PCP, Dr. Margo Aye, told her to come to ED on a Saturday or Sunday to have xrays done of her back so he can go back and look at them

## 2010-12-26 NOTE — ED Provider Notes (Signed)
History  Scribed for Joya Gaskins, MD, the patient was seen in [facility]. The chart was scribed by Gilman Schmidt. The patients care was started at 6:20 PM.   CSN: [id number] Arrival date & time: 12/26/2010  5:21 PM   First MD Initiated Contact with Patient 12/26/10 1756      Chief Complaint  Patient presents with  . Back Pain     HPI [name] [name] is a 70 y.o. female who presents to the Emergency Department complaining of back pain. Pt came in by [facility] and states her PCP, Dr. Margo Aye, told her to "come to ED on a Saturday or Sunday to have xrays done of her back so he can go back and look at them." States she has not been able to stand on own for past three weeks due to weakness and pain. Denies any recent fall. Denies any headache, fever, chest pain, difficulty breathing, or abdomina pain. Notes gradual weight loss of 40-50 pounds during the past year. Pt has Pacemaker and is on Coumadin. There are no other associated symptoms and no other alleviating or aggravating factors.    Past Medical History  Diagnosis Date  . Nonischemic cardiomyopathy 1998    EF of 25% in 1998, 15% in 2006; catheterization in 3/07-50% D1; luminal      irregularities in the other vessels; PA-65/25. 20% EF in 04/2007 with moderate MR and pulmonary htn; AICD/biventricular pacing--05/2005  . Hypertension     normal CBC, TSH in 03/2009  . Atrial fibrillation     plus nonsustained ventricular tachycardia --> amiodarone treatment  . Nonsustained ventricular tachycardia   . OSA (obstructive sleep apnea)   . Chronic kidney disease, stage 4, severely decreased GFR     Creatinine of 2.1 in 09/2008, 1.9 in 11/1998 and, 2.3 in 05/2010  . Venous stasis   . Gout     Uric acid of 12.5 in 03/2009  . Chronic anticoagulation     Past Surgical History  Procedure Date  . Appendectomy 1987  . Incision and drainage intra oral abscess 1991    submandibular abscess  . Cardiac defibrillator placement 05/2005    Medtronic  Insync, Biventricular    Family History  Problem Relation Age of Onset  . Heart attack Mother 63  . Kidney failure Father 7  . Heart attack Brother     History  Substance Use Topics  . Smoking status: Former Smoker -- 30 years  . Smokeless tobacco: Never Used  . Alcohol Use: No    OB History    Grav Para Term Preterm Abortions TAB SAB Ect Mult Living                  Review of Systems  Constitutional: Negative for fever.  Respiratory: Negative for shortness of breath.   Cardiovascular: Negative for chest pain.  Musculoskeletal:       Leg pain  Neurological: Positive for weakness. Negative for headaches.  All other systems reviewed and are negative.    Allergies  Penicillins  Home Medications   Current Outpatient Rx  Name Route Sig Dispense Refill  . ACETAMINOPHEN 500 MG PO TABS Oral Take 500 mg by mouth as needed. For pain    . ALDACTONE 25 MG PO TABS  TAKE ONE TABLET BY MOUTH ONCE DAILY. 30 each 0    Please call our office for an appt , past due for  ...  . ALLOPURINOL 100 MG PO TABS Oral Take 100 mg by mouth  2 (two) times daily.      . AMIODARONE HCL 200 MG PO TABS Oral Take by mouth daily. Dose decrease /Take 1 tablet Monday through Friday and 1/2 tablet on Saturday and Sunday     . ASPIRIN EC 81 MG PO TBEC Oral Take 81 mg by mouth daily.      . BUPROPION HCL ER (SR) 100 MG PO TB12 Oral Take 100 mg by mouth daily.      Marland Kitchen COREG 25 MG PO TABS  TAKE 1 TABLET BY MOUTH   TWICE A DAY. 60 each 6  . DIGOXIN 0.125 MG PO TABS Oral Take 125 mcg by mouth every morning.     . ENALAPRIL MALEATE 10 MG PO TABS Oral Take 10 mg by mouth 2 (two) times daily.      Marland Kitchen HYDROCODONE-ACETAMINOPHEN 5-500 MG PO TABS Oral Take 1 tablet by mouth daily as needed. For pain    . LASIX 40 MG PO TABS  TAKE 1 TABLET BY MOUTH   TWICE A DAY. 60 each 0    Please have pt call our office for a follow up. Pa ...  . METHOCARBAMOL 500 MG PO TABS Oral Take 500 mg by mouth every 8 (eight) hours as  needed. For muscle spasms     . TRAMADOL HCL 50 MG PO TABS Oral Take 25-50 mg by mouth every 8 (eight) hours as needed. Maximum dose= 8 tablets per day for pain     . WARFARIN SODIUM 5 MG PO TABS         BP 91/57  Pulse 71  Temp(Src) 97.2 F (36.2 C) (Oral)  Resp 16  Ht 5\' 8"  (1.727 m)  Wt 180 lb (81.647 kg)  BMI 27.37 kg/m2  SpO2 97%  Physical Exam CONSTITUTIONAL: Well developed/well nourished HEAD AND FACE: Normocephalic/atraumatic EYES: EOMI/PERRL ENMT: Mucous membranes moist NECK: supple no meningeal signs SPINE:lumbar spine tender, no bruising/crepitance noted CV:  no murmurs/rubs/gallops noted, atrial fibrillation  LUNGS: Lungs are clear to auscultation bilaterally, no apparent distress ABDOMEN: soft, nontender, no rebound or guarding GU:no cva tenderness NEURO: Pt is awake/alert, moves all extremitiesx4, no focal deficit  EXTREMITIES: pulses normal, chronic wrist tenderness, no warmth/edema SKIN: warm, color normal PSYCH: no abnormalities of mood noted   ED Course  Procedures   DIAGNOSTIC STUDIES: Oxygen Saturation is 97% on room air, normal by my interpretation.     LABS Results for orders placed during the hospital encounter of 12/26/10  BASIC METABOLIC PANEL      Component Value Range   Sodium 125 (*) 135 - 145 (mEq/L)   Potassium 4.1  3.5 - 5.1 (mEq/L)   Chloride 89 (*) 96 - 112 (mEq/L)   CO2 27  19 - 32 (mEq/L)   Glucose, Bld 94  70 - 99 (mg/dL)   BUN 56 (*) 6 - 23 (mg/dL)   Creatinine, Ser 4.09 (*) 0.50 - 1.10 (mg/dL)   Calcium 9.9  8.4 - 81.1 (mg/dL)   GFR calc non Af Amer 17 (*) >90 (mL/min)   GFR calc Af Amer 20 (*) >90 (mL/min)  CBC      Component Value Range   WBC 13.8 (*) 4.0 - 10.5 (K/uL)   RBC 3.93  3.87 - 5.11 (MIL/uL)   Hemoglobin 11.1 (*) 12.0 - 15.0 (g/dL)   HCT 91.4 (*) 78.2 - 46.0 (%)   MCV 88.8  78.0 - 100.0 (fL)   MCH 28.2  26.0 - 34.0 (pg)   MCHC 31.8  30.0 - 36.0 (g/dL)   RDW 16.1  09.6 - 04.5 (%)   Platelets 454 (*) 150 -  400 (K/uL)  DIFFERENTIAL      Component Value Range   Neutrophils Relative 77  43 - 77 (%)   Neutro Abs 10.7 (*) 1.7 - 7.7 (K/uL)   Lymphocytes Relative 12  12 - 46 (%)   Lymphs Abs 1.7  0.7 - 4.0 (K/uL)   Monocytes Relative 10  3 - 12 (%)   Monocytes Absolute 1.3 (*) 0.1 - 1.0 (K/uL)   Eosinophils Relative 1  0 - 5 (%)   Eosinophils Absolute 0.1  0.0 - 0.7 (K/uL)   Basophils Relative 0  0 - 1 (%)   Basophils Absolute 0.0  0.0 - 0.1 (K/uL)  PROTIME-INR      Component Value Range   Prothrombin Time 18.2 (*) 11.6 - 15.2 (seconds)   INR 1.48  0.00 - 1.49   DIGOXIN LEVEL      Component Value Range   Digoxin Level 1.9  0.8 - 2.0 (ng/mL)  URINALYSIS, ROUTINE W REFLEX MICROSCOPIC      Component Value Range   Color, Urine YELLOW  YELLOW    APPearance CLEAR  CLEAR    Specific Gravity, Urine 1.010  1.005 - 1.030    pH 6.0  5.0 - 8.0    Glucose, UA NEGATIVE  NEGATIVE (mg/dL)   Hgb urine dipstick MODERATE (*) NEGATIVE    Bilirubin Urine NEGATIVE  NEGATIVE    Ketones, ur NEGATIVE  NEGATIVE (mg/dL)   Protein, ur NEGATIVE  NEGATIVE (mg/dL)   Urobilinogen, UA 0.2  0.0 - 1.0 (mg/dL)   Nitrite POSITIVE (*) NEGATIVE    Leukocytes, UA MODERATE (*) NEGATIVE   URINE MICROSCOPIC-ADD ON      Component Value Range   Squamous Epithelial / LPF MANY (*) RARE    WBC, UA 11-20  <3 (WBC/hpf)   RBC / HPF 7-10  <3 (RBC/hpf)   Bacteria, UA MANY (*) RARE    Urine-Other TRICHOMONAS PRESENT      Radiology: DG Lumbar Spine. Reviewed by me. IMPRESSION: Degenerative changes but no acute bony findings. Original Report Authenticated By: P. Loralie Champagne, M.D.     COORDINATION OF CARE: 6:20pm:  - Patient evaluated by ED physician, DG Lumbar, BMP, CBC, Diff, Protime-INR, Digoxin level, UA ordered  8:42 PM Pt dehydrated, unable to walk due to weakness, will admit uti noted as well D/w dr Orvan Falconer, will admit  MDM  Nursing notes reviewed and considered in documentation All labs/vitals reviewed and  considered Previous records reviewed and considered   I personally performed the services described in this documentation, which was scribed in my presence. The recorded information has been reviewed and considered.           Joya Gaskins, MD 12/26/10 2043

## 2010-12-26 NOTE — ED Notes (Signed)
Pt states she is unable to get up & walk. States she has been in the bed for te last month. EDP notified

## 2010-12-26 NOTE — ED Notes (Signed)
Pt states here for back xray per dr hall. Been having trouble now for several weeks. Complains of back, hand, shoulder, & leg pain.

## 2010-12-27 DIAGNOSIS — M109 Gout, unspecified: Secondary | ICD-10-CM | POA: Diagnosis present

## 2010-12-27 DIAGNOSIS — A5901 Trichomonal vulvovaginitis: Secondary | ICD-10-CM | POA: Diagnosis present

## 2010-12-27 LAB — TSH: TSH: 1.17 u[IU]/mL (ref 0.350–4.500)

## 2010-12-27 LAB — BASIC METABOLIC PANEL
Calcium: 9.7 mg/dL (ref 8.4–10.5)
Chloride: 97 mEq/L (ref 96–112)
Creatinine, Ser: 2.55 mg/dL — ABNORMAL HIGH (ref 0.50–1.10)
GFR calc Af Amer: 21 mL/min — ABNORMAL LOW (ref >90)

## 2010-12-27 LAB — FOLATE: Folate: 6.6 ng/mL

## 2010-12-27 LAB — CBC
Platelets: 403 10*3/uL — ABNORMAL HIGH (ref 150–400)
RDW: 15.3 % (ref 11.5–15.5)
WBC: 11.1 10*3/uL — ABNORMAL HIGH (ref 4.0–10.5)

## 2010-12-27 LAB — RETICULOCYTES
Retic Count, Absolute: 86.5 10*3/uL (ref 19.0–186.0)
Retic Ct Pct: 2.3 % (ref 0.4–3.1)

## 2010-12-27 LAB — VITAMIN B12: Vitamin B-12: 320 pg/mL (ref 211–911)

## 2010-12-27 LAB — URIC ACID: Uric Acid, Serum: 7.7 mg/dL — ABNORMAL HIGH (ref 2.4–7.0)

## 2010-12-27 MED ORDER — WARFARIN SODIUM 2.5 MG PO TABS
2.5000 mg | ORAL_TABLET | Freq: Once | ORAL | Status: AC
  Administered 2010-12-27: 2.5 mg via ORAL
  Filled 2010-12-27: qty 1

## 2010-12-27 MED ORDER — METRONIDAZOLE 500 MG PO TABS
2000.0000 mg | ORAL_TABLET | Freq: Once | ORAL | Status: AC
  Administered 2010-12-27: 2000 mg via ORAL
  Filled 2010-12-27: qty 4

## 2010-12-27 MED ORDER — BIOTENE DRY MOUTH MT LIQD
15.0000 mL | Freq: Two times a day (BID) | OROMUCOSAL | Status: DC
  Administered 2010-12-27 – 2010-12-28 (×3): 15 mL via OROMUCOSAL

## 2010-12-27 MED ORDER — CIPROFLOXACIN HCL 250 MG PO TABS
250.0000 mg | ORAL_TABLET | Freq: Two times a day (BID) | ORAL | Status: DC
  Administered 2010-12-27 – 2010-12-28 (×3): 250 mg via ORAL
  Filled 2010-12-27 (×3): qty 1

## 2010-12-27 NOTE — Progress Notes (Signed)
UR Chart Review Completed  

## 2010-12-27 NOTE — Progress Notes (Signed)
Chart reviewed  Subjective: Pain better. Able to bear weight. Denies dysuria. Per nursing staff, malodorous vaginal discharge.  Objective: Vital signs in last 24 hours: Filed Vitals:   12/27/10 0642 12/27/10 0645 12/27/10 1000 12/27/10 1141  BP:  99/62 123/78   Pulse: 72 72 72 70  Temp:  98.4 F (36.9 C)    TempSrc:  Oral    Resp:  22    Height:      Weight:      SpO2:  94%  90%   Weight change:  No intake or output data in the 24 hours ending 12/27/10 1211 Physical Exam: General: Comfortable in chair. Lungs clear to auscultation bilaterally without wheeze rhonchi or rales Cardiovascular regular rate rhythm without murmurs gallops rubs Abdomen soft nontender nondistended Extremities slight edema of the right ankle. Range of motion is normal. Nontender.  Lab Results: Basic Metabolic Panel:  Lab 12/27/10 1027 12/26/10 1836  NA 134* 125*  K 5.0 4.1  CL 97 89*  CO2 28 27  GLUCOSE 157* 94  BUN 56* 56*  CREATININE 2.55* 2.66*  CALCIUM 9.7 9.9  MG -- 2.2  PHOS -- --   Liver Function Tests:  Lab 12/26/10 1836  AST 28  ALT 17  ALKPHOS 91  BILITOT 0.4  PROT 8.3  ALBUMIN 2.5*   No results found for this basename: LIPASE:2,AMYLASE:2 in the last 168 hours No results found for this basename: AMMONIA:2 in the last 168 hours CBC:  Lab 12/27/10 0425 12/26/10 1836  WBC 11.1* 13.8*  NEUTROABS -- 10.7*  HGB 10.6* 11.1*  HCT 33.6* 34.9*  MCV 89.4 88.8  PLT 403* 454*   Cardiac Enzymes: No results found for this basename: CKTOTAL:3,CKMB:3,CKMBINDEX:3,TROPONINI:3 in the last 168 hours BNP: No results found for this basename: POCBNP:3 in the last 168 hours D-Dimer: No results found for this basename: DDIMER:2 in the last 168 hours CBG: No results found for this basename: GLUCAP:6 in the last 168 hours Hemoglobin A1C: No results found for this basename: HGBA1C in the last 168 hours Fasting Lipid Panel: No results found for this basename:  CHOL,HDL,LDLCALC,TRIG,CHOLHDL,LDLDIRECT in the last 253 hours Thyroid Function Tests: No results found for this basename: TSH,T4TOTAL,FREET4,T3FREE,THYROIDAB in the last 168 hours Coagulation:  Lab 12/27/10 0425 12/26/10 1836  LABPROT 18.0* 18.2*  INR 1.46 1.48   Anemia Panel:  Lab 12/27/10 0425  VITAMINB12 --  FOLATE --  FERRITIN --  TIBC --  IRON --  RETICCTPCT 2.3   Urine Drug Screen: Drugs of Abuse  No results found for this basename: labopia, cocainscrnur, labbenz, amphetmu, thcu, labbarb    Alcohol Level: No results found for this basename: ETH:2 in the last 168 hours Urinalysis: Shows nitrite positive, leukocyte esterase moderate, Trichomonas, 11-20 white cells, 7-10 red cells, many squamous epithelial cells, many bacteria  Micro Results: No results found for this or any previous visit (from the past 240 hour(s)). Studies/Results: Dg Lumbar Spine Complete  12/26/2010  *RADIOLOGY REPORT*  Clinical Data: Back pain.  LUMBAR SPINE - COMPLETE 4+ VIEW  Comparison: None  Findings: The lumbar vertebral bodies are normally aligned.  No acute fracture or destructive bony changes.  The facets are normally aligned.  Moderate facet disease but no definite pars defects.  Aortic calcifications are noted without definite aneurysm.  The visualized bony pelvis is intact.  IMPRESSION: Degenerative changes but no acute bony findings.  Original Report Authenticated By: P. Loralie Champagne, M.D.   Dg Ankle Complete Left  12/27/2010  *RADIOLOGY REPORT*  Clinical Data: Chronic bilateral ankle and foot pain.  No known injury.  History of gout.  LEFT ANKLE COMPLETE - 3+ VIEW  Comparison: None.  Findings: The mineralization and alignment are normal.  There is no evidence of acute fracture or dislocation.  No soft tissue abnormalities are identified.  IMPRESSION: No acute osseous findings or significant arthropathic changes.  Original Report Authenticated By: Gerrianne Scale, M.D.   Dg Ankle Complete  Right  12/27/2010  *RADIOLOGY REPORT*  Clinical Data: Bilateral ankle and foot pain.  No acute injury. History of gout.  RIGHT ANKLE - COMPLETE 3+ VIEW  Comparison: 01/29/2008 radiographs.  Findings: Diffuse soft tissue swelling appears similar, primarily lateral.  There is a probable ankle joint effusion.  The mineralization and alignment are normal.  There is no evidence of acute fracture, dislocation or bone destruction.  No erosive changes are demonstrated.  IMPRESSION: No acute osseous findings.  Soft tissue swelling and ankle joint effusion.  Original Report Authenticated By: Gerrianne Scale, M.D.   Ct Lumbar Spine Wo Contrast  12/27/2010  *RADIOLOGY REPORT*  Clinical Data: Low back and bilateral hip pain.  Tenderness with gait abnormality.  No known injury.  CT LUMBAR SPINE WITHOUT CONTRAST  Technique:  Multidetector CT imaging of the lumbar spine was performed without intravenous contrast administration. Multiplanar CT image reconstructions were also generated.  Comparison: Lumbar spine radiographs same date.  No relevant prior studies.  Renal ultrasound 11/27/2009.  Findings: There are five lumbar type vertebral bodies.  The alignment is near anatomic. There is 4 mm anterolisthesis of L4 on L5 secondary to bilateral facet disease.  There is diffuse endplate irregularity at L5-S1 with associated disc space loss and sclerosis.  No bone destruction or acute fracture is demonstrated. There is no evidence of pars defect.  No significant disc space findings are present from T11-T12 through L2-L3.  L3-L4:  There is disc bulging with facet and ligamentous hypertrophy.  Mild left foraminal stenosis results.  L4-L5:  There is advanced bilateral facet disease with annular disc bulging and a grade 1 anterolisthesis.  There is moderate resulting central stenosis with left greater than right lateral recess and foraminal stenosis.  Left L4 nerve root encroachment seems likely.  L5-S1: There is chronic degenerative  disc disease with annular disc bulging and osteophytes.  Moderate facet hypertrophy is present bilaterally.  These factors contribute to moderate biforaminal stenosis.  Encroachment on either L5 nerve root is possible.  The central canal and lateral recesses appear adequately patent.  Incidentally noted is diffuse aortic atherosclerosis.  There is a distal aneurysm measuring 3.2 cm in AP diameter.  Projecting anteriorly from the mid right kidney is a 1.9 cm low density lesion on image 58, probably a cyst as correlated with prior ultrasound.  IMPRESSION:  1.  No acute osseous findings. 2.  Advanced facet disease at L4-L5 results in grade 1 anterolisthesis, moderate central stenosis and left greater than right lateral recess and foraminal stenosis.  Left L4 nerve root encroachment is likely. 3.  Moderate biforaminal stenosis at L5-S1 secondary to chronic degenerative disc disease with and with disc bulging and osteophytes.  Encroachment on either L5 nerve root is possible. 4.  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  Original Report Authenticated By: Gerrianne Scale, M.D.   Ct Pelvis Wo Contrast  12/27/2010  *RADIOLOGY REPORT*  Clinical Data:  Low back, bilateral hip and sacral pain.  No known injury.  CT PELVIS WITHOUT CONTRAST  Technique:  Multidetector CT imaging of the pelvis was performed following the standard protocol without intravenous contrast.  Comparison:  None relevant.  Findings:  Bone detail is limited by body habitus and technique. There is mild osteopenia.  Lower lumbar spine degenerative changes are dictated separately.  There are mild degenerative changes at the sacroiliac joints and symphysis pubis.  No significant hip arthropathic changes are demonstrated.  There is no evidence of acute fracture, dislocation or femoral head osteonecrosis.  Scattered vascular calcifications are noted. No inflammatory changes are evident.  IMPRESSION:  1.  No acute pelvic findings identified. 2.   Mild degenerative changes at the symphysis pubis and sacroiliac joints. 3.  More advanced lower lumbar spine degenerative changes are dictated separately.  Original Report Authenticated By: Gerrianne Scale, M.D.   Dg Foot Complete Left  12/27/2010  *RADIOLOGY REPORT*  Clinical Data: Chronic bilateral ankle and foot pain.  No known injury.  History of gout.  LEFT FOOT - COMPLETE 3+ VIEW  Comparison: None.  Findings: There is joint space loss at the first metatarsal phalangeal joint associated with a hallux valgus deformity and erosion of the first metatarsal head medially.  No soft tissue calcifications are identified.  There is no evidence of acute fracture or subluxation.  No other significant arthropathic changes are seen.  IMPRESSION: First metatarsal phalangeal arthropathic changes may represent chronic gout or osteoarthritis.  No acute osseous findings.  Original Report Authenticated By: Gerrianne Scale, M.D.   Dg Foot Complete Right  12/27/2010  *RADIOLOGY REPORT*  Clinical Data: Chronic bilateral ankle and foot pain.  No known injury. History of gout.  RIGHT FOOT COMPLETE - 3+ VIEW  Comparison: None.  Findings: There is joint space loss at the first metatarsal phalangeal joint associated with a mild hallux valgus deformity. There is some erosion of the first metatarsal head medially.  No soft tissue calcifications are identified.  There is no evidence of acute fracture or dislocation.  Post-traumatic deformity of the proximal third phalanx is noted consistent with an old healed fracture.  IMPRESSION: First metatarsal phalangeal arthropathic changes could be secondary to gout or osteoarthritis.  Old fracture of the third proximal phalanx.  No acute osseous findings identified.  Original Report Authenticated By: Gerrianne Scale, M.D.   Scheduled Meds:   . sodium chloride   Intravenous Once  . allopurinol  100 mg Oral BID  . amiodarone  100 mg Oral Custom  . amiodarone  200 mg Oral Custom  .  antiseptic oral rinse  15 mL Mouth Rinse BID  . aspirin EC  81 mg Oral Daily  . buPROPion  100 mg Oral Daily  . carvedilol  25 mg Oral BID WC  . ciprofloxacin  250 mg Oral BID  . colchicine  0.6 mg Oral TID   Followed by  . colchicine  0.6 mg Oral BID   Followed by  . colchicine  0.6 mg Oral Daily  . digoxin  125 mcg Oral Q0700  . enalapril  10 mg Oral BID  . furosemide  40 mg Oral BID  . ibuprofen  400 mg Oral Once  . metroNIDAZOLE  2,000 mg Oral Once  . nitrofurantoin (macrocrystal-monohydrate)  100 mg Oral Once  . sodium chloride  500 mL Intravenous Once  . spironolactone  12.5 mg Oral Daily  . warfarin  2.5 mg Oral Once  . warfarin  2.5 mg Oral ONCE-1800  . DISCONTD: amiodarone  200 mg Oral UD  . DISCONTD: methylPREDNISolone (SOLU-MEDROL) injection  40 mg Intravenous Q6H   Continuous Infusions:  PRN Meds:.acetaminophen, HYDROmorphone, methocarbamol, ondansetron (ZOFRAN) IV, ondansetron, oxyCODONE, traMADol, traZODone, DISCONTD: acetaminophen Assessment/Plan: Principal Problem:  *Gait abnormality Active Problems:  Gout flare  Hypertension  Atrial fibrillation  Chronic anticoagulation  Chronic kidney disease, stage 4, severely decreased GFR  UTI (lower urinary tract infection)  Nonischemic cardiomyopathy  Back pain  Hyponatremia  Trichomonal vaginitis  Dehydration  Patient's pain and gait has improved today. Continue colchicine. Stop Solu-Medrol. Start Cipro for urinary tract infection. Give 2 g of metronidazole for trichomonas. Sodium is improved. Monitor labs. Coumadin is subtherapeutic. Home tomorrow if continues to be stable.   LOS: 1 day   Laporscha Linehan L 12/27/2010, 12:11 PM

## 2010-12-27 NOTE — Progress Notes (Signed)
Pt ambulated to bathroom with minimal assistance. Pt stated she had some weakness on right side, but was able to ambulate to bathroom with standby assist. Will continue to work with patient. Patient up in chair without any signs of problems.

## 2010-12-27 NOTE — Progress Notes (Signed)
Physical Therapy Evaluation Patient Details Name: [name] [name] MRN: [id number] DOB: 1940/05/27 Today's Date: 12/27/2010  Problem List:  Patient Active Problem List  Diagnoses  . OSTEOARTHRITIS, MODERATE  . SLEEP APNEA  . AUTOMATIC IMPLANTABLE CARDIAC DEFIBRILLATOR SITU  . Nonischemic cardiomyopathy  . Hypertension  . Atrial fibrillation  . Chronic anticoagulation  . Chronic kidney disease, stage 4, severely decreased GFR  . Gait abnormality  . Back pain  . Dehydration  . Hyponatremia  . UTI (lower urinary tract infection)  . Gout flare  . Trichomonal vaginitis    Past Medical History:  Past Medical History  Diagnosis Date  . Nonischemic cardiomyopathy 1998    EF of 25% in 1998, 15% in 2006; catheterization in 3/07-50% D1; luminal      irregularities in the other vessels; PA-65/25. 20% EF in 04/2007 with moderate MR and pulmonary htn; AICD/biventricular pacing--05/2005  . Hypertension     normal CBC, TSH in 03/2009  . Atrial fibrillation     plus nonsustained ventricular tachycardia --> amiodarone treatment  . Nonsustained ventricular tachycardia   . OSA (obstructive sleep apnea)   . Chronic kidney disease, stage 4, severely decreased GFR     Creatinine of 2.1 in 09/2008, 1.9 in 11/1998 and, 2.3 in 05/2010  . Venous stasis   . Gout     Uric acid of 12.5 in 03/2009  . Chronic anticoagulation   . History of lupus   . CHF (congestive heart failure)   . Chronic kidney disease    Past Surgical History:  Past Surgical History  Procedure Date  . Appendectomy 1987  . Incision and drainage intra oral abscess 1991    submandibular abscess  . Cardiac defibrillator placement 05/2005    [employer] Insync, Biventricular    PT Assessment/Plan/Recommendation PT Assessment Clinical Impression Statement: pt very optimistic and hoping to return home...appears to be deconditioned, getting dyspneic with min exertion, but gait is stable with a walker for functional distances ...she is  normally very sedentary, has all needed DME except BSC...we could do HHPT for safety eval if she would like PT Recommendation/Assessment: Patent does not need any further PT services No Skilled PT: Patient at baseline level of functioning;Patient is modified independent with all activity/mobility PT Goals     PT Evaluation Precautions/Restrictions  Precautions Required Braces or Orthoses: No Restrictions Weight Bearing Restrictions: No Prior Functioning  Home Living Lives With: Family Receives Help From: Family Type of Home: House Home Layout: Two level;Able to live on main level with bedroom/bathroom Alternate Level Stairs-Number of Steps: full flight Home Access: Stairs to enter Entrance Stairs-Rails: Left Entrance Stairs-Number of Steps: 3 Bathroom Toilet: Standard Home Adaptive Equipment: Walker - rolling;Straight cane Prior Function Level of Independence: Independent with basic ADLs;Independent with gait;Independent with transfers;Requires assistive device for independence Driving: No Cognition Cognition Arousal/Alertness: Awake/alert Overall Cognitive Status: Appears within functional limits for tasks assessed Sensation/Coordination Sensation Light Touch: Appears Intact Stereognosis: Not tested Hot/Cold: Not tested Proprioception: Appears Intact Coordination Gross Motor Movements are Fluid and Coordinated: Yes Extremity Assessment RUE Assessment RUE Assessment: Exceptions to [facility] (no finger flex in hand) LUE Assessment LUE Assessment: Within Functional Limits RLE Assessment RLE Assessment: Within Functional Limits LLE Assessment LLE Assessment: Within Functional Limits Mobility (including Balance) Bed Mobility Bed Mobility:  (independent) Transfers Transfers: Yes (independent) Ambulation/Gait Ambulation/Gait: Yes Ambulation/Gait Assistance: 6: Modified independent (Device/Increase time) Ambulation Distance (Feet): 120 Feet Assistive device: Rolling  walker Gait Pattern: Within Functional Limits (leans hand on R grip due to  decreased grasp) Gait velocity: slow Stairs: No Wheelchair Mobility Wheelchair Mobility: No  Posture/Postural Control Posture/Postural Control: No significant limitations Balance Balance Assessed: No Exercise    End of Session PT - End of Session Equipment Utilized During Treatment: Gait belt Activity Tolerance: Patient tolerated treatment well Patient left: in chair;with call bell in reach;with bed alarm set General Behavior During Session: [facility] for tasks performed Cognition: [facility] for tasks performed  Konrad Penta 12/27/2010, 2:01 PM

## 2010-12-27 NOTE — Progress Notes (Signed)
CSW received referral from MD for possible placement.  Pt does not meet inpatient status per CM and is ambulating per RN note.  CSW will follow up if needed but will otherwise sign off at this time.  Karn Cassis

## 2010-12-27 NOTE — Progress Notes (Signed)
ANTICOAGULATION CONSULT NOTE - Initial Consult  Pharmacy Consult for Warfarin Indication: atrial fibrillation  Allergies  Allergen Reactions  . Penicillins Other (See Comments)    Stiffness    Patient Measurements: Height: 5\' 8"  (172.7 cm) Weight: 192 lb 0.3 oz (87.1 kg) IBW/kg (Calculated) : 63.9   Vital Signs: Temp: 98.4 F (36.9 C) (12/03 0645) Temp src: Oral (12/03 0645) BP: 99/62 mmHg (12/03 0645) Pulse Rate: 72  (12/03 0645)  Labs:  Basename 12/27/10 0425 12/26/10 1836  HGB 10.6* 11.1*  HCT 33.6* 34.9*  PLT 403* 454*  APTT -- --  LABPROT 18.0* 18.2*  INR 1.46 1.48  HEPARINUNFRC -- --  CREATININE 2.55* 2.66*  CKTOTAL -- --  CKMB -- --  TROPONINI -- --   Estimated Creatinine Clearance: 24.4 ml/min (by C-G formula based on Cr of 2.55).  Medical History: Past Medical History  Diagnosis Date  . Nonischemic cardiomyopathy 1998    EF of 25% in 1998, 15% in 2006; catheterization in 3/07-50% D1; luminal      irregularities in the other vessels; PA-65/25. 20% EF in 04/2007 with moderate MR and pulmonary htn; AICD/biventricular pacing--05/2005  . Hypertension     normal CBC, TSH in 03/2009  . Atrial fibrillation     plus nonsustained ventricular tachycardia --> amiodarone treatment  . Nonsustained ventricular tachycardia   . OSA (obstructive sleep apnea)   . Chronic kidney disease, stage 4, severely decreased GFR     Creatinine of 2.1 in 09/2008, 1.9 in 11/1998 and, 2.3 in 05/2010  . Venous stasis   . Gout     Uric acid of 12.5 in 03/2009  . Chronic anticoagulation   . History of lupus   . CHF (congestive heart failure)   . Chronic kidney disease     Medications:  Prescriptions prior to admission  Medication Sig Dispense Refill  . acetaminophen (TYLENOL) 500 MG tablet Take 500 mg by mouth as needed. For pain      . ALDACTONE 25 MG tablet TAKE ONE TABLET BY MOUTH ONCE DAILY.  30 each  0  . allopurinol (ZYLOPRIM) 100 MG tablet Take 100 mg by mouth 2 (two) times  daily.        Marland Kitchen amiodarone (PACERONE) 200 MG tablet Take by mouth daily. Dose decrease /Take 1 tablet Monday through Friday and 1/2 tablet on Saturday and Sunday       . aspirin EC 81 MG tablet Take 81 mg by mouth daily.        Marland Kitchen buPROPion (WELLBUTRIN SR) 100 MG 12 hr tablet Take 100 mg by mouth daily.        Marland Kitchen COREG 25 MG tablet TAKE 1 TABLET BY MOUTH   TWICE A DAY.  60 each  6  . digoxin (LANOXIN) 0.125 MG tablet Take 125 mcg by mouth every morning.       . enalapril (VASOTEC) 10 MG tablet Take 10 mg by mouth 2 (two) times daily.        Marland Kitchen HYDROcodone-acetaminophen (VICODIN) 5-500 MG per tablet Take 1 tablet by mouth daily as needed. For pain      . LASIX 40 MG tablet TAKE 1 TABLET BY MOUTH   TWICE A DAY.  60 each  0  . methocarbamol (ROBAXIN) 500 MG tablet Take 500 mg by mouth every 8 (eight) hours as needed. For muscle spasms       . traMADol (ULTRAM) 50 MG tablet Take 25-50 mg by mouth every 8 (eight) hours as  needed. Maximum dose= 8 tablets per day for pain       . warfarin (COUMADIN) 5 MG tablet          Assessment: Okay for Protocol  Goal of Therapy:  INR 2-3   Plan:  Warfarin 2.5 mg PO given this am.  Will give an additional 2.5 this evening for a total of 5mg  today. Daily PT/INR.  Lamonte Richer R 12/27/2010,9:18 AM

## 2010-12-28 LAB — BASIC METABOLIC PANEL
BUN: 57 mg/dL — ABNORMAL HIGH (ref 6–23)
Chloride: 97 mEq/L (ref 96–112)
Creatinine, Ser: 2.17 mg/dL — ABNORMAL HIGH (ref 0.50–1.10)
GFR calc Af Amer: 26 mL/min — ABNORMAL LOW (ref >90)
GFR calc non Af Amer: 22 mL/min — ABNORMAL LOW (ref >90)
Glucose, Bld: 137 mg/dL — ABNORMAL HIGH (ref 70–99)

## 2010-12-28 LAB — PROTIME-INR: Prothrombin Time: 19.3 seconds — ABNORMAL HIGH (ref 11.6–15.2)

## 2010-12-28 LAB — ANTI-DNA ANTIBODY, DOUBLE-STRANDED: ds DNA Ab: 3 IU/mL (ref <30)

## 2010-12-28 LAB — ANA: Anti Nuclear Antibody(ANA): NEGATIVE

## 2010-12-28 MED ORDER — WARFARIN SODIUM 5 MG PO TABS: 5.0000 mg | ORAL_TABLET | Freq: Once | ORAL | Status: DC

## 2010-12-28 MED ORDER — COLCHICINE 0.6 MG PO TABS: 0.6000 mg | ORAL_TABLET | Freq: Every day | ORAL | Status: DC | PRN

## 2010-12-28 MED ORDER — CIPROFLOXACIN HCL 250 MG PO TABS: 250.0000 mg | ORAL_TABLET | Freq: Two times a day (BID) | ORAL | Status: AC

## 2010-12-28 NOTE — Progress Notes (Signed)
Discharge instructions given and explained. Understanding verbalized by patient and family member. Prescription given to patient. Discharge home with no complaints. Patient was assisted out by staff and transported home by private vehicle. Patient in stable condition at time of discharge.  

## 2010-12-28 NOTE — Progress Notes (Signed)
ANTICOAGULATION CONSULT NOTE - Initial Consult  Pharmacy Consult for Warfarin Indication: atrial fibrillation  Allergies  Allergen Reactions  . Penicillins Other (See Comments)    Stiffness    Patient Measurements: Height: 5\' 8"  (172.7 cm) Weight: 194 lb 9.6 oz (88.27 kg) IBW/kg (Calculated) : 63.9   Vital Signs: Temp: 97.4 F (36.3 C) (12/04 0609) Temp src: Oral (12/04 0609) BP: 109/69 mmHg (12/04 0609) Pulse Rate: 90  (12/04 0609)  Labs:  Basename 12/28/10 0524 12/27/10 0425 12/26/10 1836  HGB -- 10.6* 11.1*  HCT -- 33.6* 34.9*  PLT -- 403* 454*  APTT -- -- --  LABPROT 19.3* 18.0* 18.2*  INR 1.60* 1.46 1.48  HEPARINUNFRC -- -- --  CREATININE 2.17* 2.55* 2.66*  CKTOTAL -- -- --  CKMB -- -- --  TROPONINI -- -- --   Estimated Creatinine Clearance: 28.9 ml/min (by C-G formula based on Cr of 2.17).  Medical History: Past Medical History  Diagnosis Date  . Nonischemic cardiomyopathy 1998    EF of 25% in 1998, 15% in 2006; catheterization in 3/07-50% D1; luminal      irregularities in the other vessels; PA-65/25. 20% EF in 04/2007 with moderate MR and pulmonary htn; AICD/biventricular pacing--05/2005  . Hypertension     normal CBC, TSH in 03/2009  . Atrial fibrillation     plus nonsustained ventricular tachycardia --> amiodarone treatment  . Nonsustained ventricular tachycardia   . OSA (obstructive sleep apnea)   . Chronic kidney disease, stage 4, severely decreased GFR     Creatinine of 2.1 in 09/2008, 1.9 in 11/1998 and, 2.3 in 05/2010  . Venous stasis   . Gout     Uric acid of 12.5 in 03/2009  . Chronic anticoagulation   . History of lupus   . CHF (congestive heart failure)   . Chronic kidney disease     Medications:  Prescriptions prior to admission  Medication Sig Dispense Refill  . acetaminophen (TYLENOL) 500 MG tablet Take 500 mg by mouth as needed. For pain      . ALDACTONE 25 MG tablet TAKE ONE TABLET BY MOUTH ONCE DAILY.  30 each  0  . allopurinol  (ZYLOPRIM) 100 MG tablet Take 100 mg by mouth 2 (two) times daily.        Marland Kitchen amiodarone (PACERONE) 200 MG tablet Take by mouth daily. Dose decrease /Take 1 tablet Monday through Friday and 1/2 tablet on Saturday and Sunday       . aspirin EC 81 MG tablet Take 81 mg by mouth daily.        Marland Kitchen buPROPion (WELLBUTRIN SR) 100 MG 12 hr tablet Take 100 mg by mouth daily.        Marland Kitchen COREG 25 MG tablet TAKE 1 TABLET BY MOUTH   TWICE A DAY.  60 each  6  . digoxin (LANOXIN) 0.125 MG tablet Take 125 mcg by mouth every morning.       . enalapril (VASOTEC) 10 MG tablet Take 10 mg by mouth 2 (two) times daily.        Marland Kitchen HYDROcodone-acetaminophen (VICODIN) 5-500 MG per tablet Take 1 tablet by mouth daily as needed. For pain      . LASIX 40 MG tablet TAKE 1 TABLET BY MOUTH   TWICE A DAY.  60 each  0  . methocarbamol (ROBAXIN) 500 MG tablet Take 500 mg by mouth every 8 (eight) hours as needed. For muscle spasms       . traMADol Janean Sark)  50 MG tablet Take 25-50 mg by mouth every 8 (eight) hours as needed. Maximum dose= 8 tablets per day for pain       . warfarin (COUMADIN) 5 MG tablet Take 2.5-5 mg by mouth daily. Takes 2.5 mg on Tuesday & Friday and takes 5 mg all other days       . DISCONTD: warfarin (COUMADIN) 5 MG tablet         Assessment: Okay for Protocol. Sub-therapeutic INR but trending up.  Goal of Therapy:  INR 2-3   Plan:  Warfarin 5 mg today. Daily PT/INR.  Gilman Buttner, [place] J 12/28/2010,8:30 AM

## 2010-12-28 NOTE — Discharge Summary (Signed)
Physician Discharge Summary  Patient ID: [name] [name] MRN: [id number] DOB/AGE: 1940-05-21 70 y.o.  Admit date: 12/26/2010 Discharge date: 12/28/2010  Discharge Diagnoses:  Principal Problem:  *Gait abnormality Active Problems:  Gout flare  Hypertension  Atrial fibrillation  Chronic anticoagulation  Chronic kidney disease, stage 4, severely decreased GFR  UTI (lower urinary tract infection)  Nonischemic cardiomyopathy  Back pain  Hyponatremia  Trichomonal vaginitis  Dehydration   Current Discharge Medication List    START taking these medications   Details  ciprofloxacin (CIPRO) 250 MG tablet Take 1 tablet (250 mg total) by mouth 2 (two) times daily. Qty: 6 tablet, Refills: 0    colchicine 0.6 MG tablet Take 1 tablet (0.6 mg total) by mouth daily as needed.      CONTINUE these medications which have NOT CHANGED   Details  acetaminophen (TYLENOL) 500 MG tablet Take 500 mg by mouth as needed. For pain    ALDACTONE 25 MG tablet TAKE ONE TABLET BY MOUTH ONCE DAILY. Qty: 30 each, Refills: 0    allopurinol (ZYLOPRIM) 100 MG tablet Take 100 mg by mouth 2 (two) times daily.      amiodarone (PACERONE) 200 MG tablet Take by mouth daily. Dose decrease /Take 1 tablet Monday through Friday and 1/2 tablet on Saturday and Sunday     aspirin EC 81 MG tablet Take 81 mg by mouth daily.      buPROPion (WELLBUTRIN SR) 100 MG 12 hr tablet Take 100 mg by mouth daily.      COREG 25 MG tablet TAKE 1 TABLET BY MOUTH   TWICE A DAY. Qty: 60 each, Refills: 6    digoxin (LANOXIN) 0.125 MG tablet Take 125 mcg by mouth every morning.     enalapril (VASOTEC) 10 MG tablet Take 10 mg by mouth 2 (two) times daily.      HYDROcodone-acetaminophen (VICODIN) 5-500 MG per tablet Take 1 tablet by mouth daily as needed. For pain    LASIX 40 MG tablet TAKE 1 TABLET BY MOUTH   TWICE A DAY. Qty: 60 each, Refills: 0    methocarbamol (ROBAXIN) 500 MG tablet Take 500 mg by mouth every 8 (eight) hours as  needed. For muscle spasms     traMADol (ULTRAM) 50 MG tablet Take 25-50 mg by mouth every 8 (eight) hours as needed. Maximum dose= 8 tablets per day for pain     warfarin (COUMADIN) 5 MG tablet Take 2.5-5 mg by mouth daily. Takes 2.5 mg on Tuesday & Friday and takes 5 mg all other days         Discharge Orders    Future Appointments: Provider: Department: Dept Phone: Center:   12/30/2010 9:15 AM Joice Lofts Caryl Bis, RN [facility] [phone] [id number]   01/03/2011 2:40 PM Louanna Raw, RN [facility] [phone] [id number]     Future Orders Please Complete By Expires   Diet - low sodium heart healthy      Activity as tolerated - No restrictions         Follow-up Information    Follow up with HALL,ZACK in 2 weeks.         Disposition: Home or Self Care with home PT  Discharged Condition: stable  Consults:  PT  Labs:   Results for orders placed during the hospital encounter of 12/26/10 (from the past 48 hour(s))  BASIC METABOLIC PANEL     Status: Abnormal   Collection Time   12/26/10  6:36 PM  Component Value Range Comment   Sodium 125 (*) 135 - 145 (mEq/L)    Potassium 4.1  3.5 - 5.1 (mEq/L)    Chloride 89 (*) 96 - 112 (mEq/L)    CO2 27  19 - 32 (mEq/L)    Glucose, Bld 94  70 - 99 (mg/dL)    BUN 56 (*) 6 - 23 (mg/dL)    Creatinine, Ser 1.61 (*) 0.50 - 1.10 (mg/dL)    Calcium 9.9  8.4 - 10.5 (mg/dL)    GFR calc non Af Amer 17 (*) >90 (mL/min)    GFR calc Af Amer 20 (*) >90 (mL/min)   CBC     Status: Abnormal   Collection Time   12/26/10  6:36 PM      Component Value Range Comment   WBC 13.8 (*) 4.0 - 10.5 (K/uL)    RBC 3.93  3.87 - 5.11 (MIL/uL)    Hemoglobin 11.1 (*) 12.0 - 15.0 (g/dL)    HCT 09.6 (*) 04.5 - 46.0 (%)    MCV 88.8  78.0 - 100.0 (fL)    MCH 28.2  26.0 - 34.0 (pg)    MCHC 31.8  30.0 - 36.0 (g/dL)    RDW 40.9  81.1 - 91.4 (%)    Platelets 454 (*) 150 - 400 (K/uL)   DIFFERENTIAL     Status: Abnormal   Collection Time   12/26/10   6:36 PM      Component Value Range Comment   Neutrophils Relative 77  43 - 77 (%)    Neutro Abs 10.7 (*) 1.7 - 7.7 (K/uL)    Lymphocytes Relative 12  12 - 46 (%)    Lymphs Abs 1.7  0.7 - 4.0 (K/uL)    Monocytes Relative 10  3 - 12 (%)    Monocytes Absolute 1.3 (*) 0.1 - 1.0 (K/uL)    Eosinophils Relative 1  0 - 5 (%)    Eosinophils Absolute 0.1  0.0 - 0.7 (K/uL)    Basophils Relative 0  0 - 1 (%)    Basophils Absolute 0.0  0.0 - 0.1 (K/uL)   PROTIME-INR     Status: Abnormal   Collection Time   12/26/10  6:36 PM      Component Value Range Comment   Prothrombin Time 18.2 (*) 11.6 - 15.2 (seconds)    INR 1.48  0.00 - 1.49    DIGOXIN LEVEL     Status: Normal   Collection Time   12/26/10  6:36 PM      Component Value Range Comment   Digoxin Level 1.9  0.8 - 2.0 (ng/mL)   HEPATIC FUNCTION PANEL     Status: Abnormal   Collection Time   12/26/10  6:36 PM      Component Value Range Comment   Total Protein 8.3  6.0 - 8.3 (g/dL)    Albumin 2.5 (*) 3.5 - 5.2 (g/dL)    AST 28  0 - 37 (U/L)    ALT 17  0 - 35 (U/L)    Alkaline Phosphatase 91  39 - 117 (U/L)    Total Bilirubin 0.4  0.3 - 1.2 (mg/dL)    Bilirubin, Direct 0.1  0.0 - 0.3 (mg/dL)    Indirect Bilirubin 0.3  0.3 - 0.9 (mg/dL)   MAGNESIUM     Status: Normal   Collection Time   12/26/10  6:36 PM      Component Value Range Comment   Magnesium 2.2  1.5 - 2.5 (  mg/dL)   TSH     Status: Normal   Collection Time   12/26/10  6:36 PM      Component Value Range Comment   TSH 1.170  0.350 - 4.500 (uIU/mL)   URINALYSIS, ROUTINE W REFLEX MICROSCOPIC     Status: Abnormal   Collection Time   12/26/10  6:44 PM      Component Value Range Comment   Color, Urine YELLOW  YELLOW     APPearance CLEAR  CLEAR     Specific Gravity, Urine 1.010  1.005 - 1.030     pH 6.0  5.0 - 8.0     Glucose, UA NEGATIVE  NEGATIVE (mg/dL)    Hgb urine dipstick MODERATE (*) NEGATIVE     Bilirubin Urine NEGATIVE  NEGATIVE     Ketones, ur NEGATIVE  NEGATIVE (mg/dL)      Protein, ur NEGATIVE  NEGATIVE (mg/dL)    Urobilinogen, UA 0.2  0.0 - 1.0 (mg/dL)    Nitrite POSITIVE (*) NEGATIVE     Leukocytes, UA MODERATE (*) NEGATIVE    URINE MICROSCOPIC-ADD ON     Status: Abnormal   Collection Time   12/26/10  6:44 PM      Component Value Range Comment   Squamous Epithelial / LPF MANY (*) RARE     WBC, UA 11-20  <3 (WBC/hpf)    RBC / HPF 7-10  <3 (RBC/hpf)    Bacteria, UA MANY (*) RARE     Urine-Other TRICHOMONAS PRESENT   CLUE CELLS PRESENT  BASIC METABOLIC PANEL     Status: Abnormal   Collection Time   12/27/10  4:25 AM      Component Value Range Comment   Sodium 134 (*) 135 - 145 (mEq/L) DELTA CHECK NOTED   Potassium 5.0  3.5 - 5.1 (mEq/L)    Chloride 97  96 - 112 (mEq/L)    CO2 28  19 - 32 (mEq/L)    Glucose, Bld 157 (*) 70 - 99 (mg/dL)    BUN 56 (*) 6 - 23 (mg/dL)    Creatinine, Ser 1.61 (*) 0.50 - 1.10 (mg/dL)    Calcium 9.7  8.4 - 10.5 (mg/dL)    GFR calc non Af Amer 18 (*) >90 (mL/min)    GFR calc Af Amer 21 (*) >90 (mL/min)   CBC     Status: Abnormal   Collection Time   12/27/10  4:25 AM      Component Value Range Comment   WBC 11.1 (*) 4.0 - 10.5 (K/uL)    RBC 3.76 (*) 3.87 - 5.11 (MIL/uL)    Hemoglobin 10.6 (*) 12.0 - 15.0 (g/dL)    HCT 09.6 (*) 04.5 - 46.0 (%)    MCV 89.4  78.0 - 100.0 (fL)    MCH 28.2  26.0 - 34.0 (pg)    MCHC 31.5  30.0 - 36.0 (g/dL)    RDW 40.9  81.1 - 91.4 (%)    Platelets 403 (*) 150 - 400 (K/uL)   ANA     Status: Normal   Collection Time   12/27/10  4:25 AM      Component Value Range Comment   ANA NEGATIVE  NEGATIVE    VITAMIN B12     Status: Normal   Collection Time   12/27/10  4:25 AM      Component Value Range Comment   Vitamin B-12 320  211 - 911 (pg/mL)   SEDIMENTATION RATE     Status: Normal  Collection Time   12/27/10  4:25 AM      Component Value Range Comment   Sed Rate 15  0 - 22 (mm/hr)   IRON AND TIBC     Status: Abnormal   Collection Time   12/27/10  4:25 AM      Component Value Range  Comment   Iron 16 (*) 42 - 135 (ug/dL)    TIBC 161  096 - 045 (ug/dL)    Saturation Ratios 6 (*) 20 - 55 (%)    UIBC 244  125 - 400 (ug/dL)   FERRITIN     Status: Abnormal   Collection Time   12/27/10  4:25 AM      Component Value Range Comment   Ferritin 490 (*) 10 - 291 (ng/mL)   FOLATE     Status: Normal   Collection Time   12/27/10  4:25 AM      Component Value Range Comment   Folate 6.6     RETICULOCYTES     Status: Abnormal   Collection Time   12/27/10  4:25 AM      Component Value Range Comment   Retic Ct Pct 2.3  0.4 - 3.1 (%)    RBC. 3.76 (*) 3.87 - 5.11 (MIL/uL)    Retic Count, Manual 86.5  19.0 - 186.0 (K/uL)   URIC ACID     Status: Abnormal   Collection Time   12/27/10  4:25 AM      Component Value Range Comment   Uric Acid, Serum 7.7 (*) 2.4 - 7.0 (mg/dL)   PROTIME-INR     Status: Abnormal   Collection Time   12/27/10  4:25 AM      Component Value Range Comment   Prothrombin Time 18.0 (*) 11.6 - 15.2 (seconds)    INR 1.46  0.00 - 1.49    PROTIME-INR     Status: Abnormal   Collection Time   12/28/10  5:24 AM      Component Value Range Comment   Prothrombin Time 19.3 (*) 11.6 - 15.2 (seconds)    INR 1.60 (*) 0.00 - 1.49    BASIC METABOLIC PANEL     Status: Abnormal   Collection Time   12/28/10  5:24 AM      Component Value Range Comment   Sodium 131 (*) 135 - 145 (mEq/L)    Potassium 4.7  3.5 - 5.1 (mEq/L)    Chloride 97  96 - 112 (mEq/L)    CO2 26  19 - 32 (mEq/L)    Glucose, Bld 137 (*) 70 - 99 (mg/dL)    BUN 57 (*) 6 - 23 (mg/dL)    Creatinine, Ser 4.09 (*) 0.50 - 1.10 (mg/dL)    Calcium 9.0  8.4 - 10.5 (mg/dL)    GFR calc non Af Amer 22 (*) >90 (mL/min)    GFR calc Af Amer 26 (*) >90 (mL/min)     Diagnostics:  Dg Lumbar Spine Complete  12/26/2010  *RADIOLOGY REPORT*  Clinical Data: Back pain.  LUMBAR SPINE - COMPLETE 4+ VIEW  Comparison: None  Findings: The lumbar vertebral bodies are normally aligned.  No acute fracture or destructive bony changes.   The facets are normally aligned.  Moderate facet disease but no definite pars defects.  Aortic calcifications are noted without definite aneurysm.  The visualized bony pelvis is intact.  IMPRESSION: Degenerative changes but no acute bony findings.  Original Report Authenticated By: P. Loralie Champagne, M.D.   Dg Ankle  Complete Left  12/27/2010  *RADIOLOGY REPORT*  Clinical Data: Chronic bilateral ankle and foot pain.  No known injury.  History of gout.  LEFT ANKLE COMPLETE - 3+ VIEW  Comparison: None.  Findings: The mineralization and alignment are normal.  There is no evidence of acute fracture or dislocation.  No soft tissue abnormalities are identified.  IMPRESSION: No acute osseous findings or significant arthropathic changes.  Original Report Authenticated By: Gerrianne Scale, M.D.   Dg Ankle Complete Right  12/27/2010  *RADIOLOGY REPORT*  Clinical Data: Bilateral ankle and foot pain.  No acute injury. History of gout.  RIGHT ANKLE - COMPLETE 3+ VIEW  Comparison: 01/29/2008 radiographs.  Findings: Diffuse soft tissue swelling appears similar, primarily lateral.  There is a probable ankle joint effusion.  The mineralization and alignment are normal.  There is no evidence of acute fracture, dislocation or bone destruction.  No erosive changes are demonstrated.  IMPRESSION: No acute osseous findings.  Soft tissue swelling and ankle joint effusion.  Original Report Authenticated By: Gerrianne Scale, M.D.   Ct Lumbar Spine Wo Contrast  12/27/2010  *RADIOLOGY REPORT*  Clinical Data: Low back and bilateral hip pain.  Tenderness with gait abnormality.  No known injury.  CT LUMBAR SPINE WITHOUT CONTRAST  Technique:  Multidetector CT imaging of the lumbar spine was performed without intravenous contrast administration. Multiplanar CT image reconstructions were also generated.  Comparison: Lumbar spine radiographs same date.  No relevant prior studies.  Renal ultrasound 11/27/2009.  Findings: There are five  lumbar type vertebral bodies.  The alignment is near anatomic. There is 4 mm anterolisthesis of L4 on L5 secondary to bilateral facet disease.  There is diffuse endplate irregularity at L5-S1 with associated disc space loss and sclerosis.  No bone destruction or acute fracture is demonstrated. There is no evidence of pars defect.  No significant disc space findings are present from T11-T12 through L2-L3.  L3-L4:  There is disc bulging with facet and ligamentous hypertrophy.  Mild left foraminal stenosis results.  L4-L5:  There is advanced bilateral facet disease with annular disc bulging and a grade 1 anterolisthesis.  There is moderate resulting central stenosis with left greater than right lateral recess and foraminal stenosis.  Left L4 nerve root encroachment seems likely.  L5-S1: There is chronic degenerative disc disease with annular disc bulging and osteophytes.  Moderate facet hypertrophy is present bilaterally.  These factors contribute to moderate biforaminal stenosis.  Encroachment on either L5 nerve root is possible.  The central canal and lateral recesses appear adequately patent.  Incidentally noted is diffuse aortic atherosclerosis.  There is a distal aneurysm measuring 3.2 cm in AP diameter.  Projecting anteriorly from the mid right kidney is a 1.9 cm low density lesion on image 58, probably a cyst as correlated with prior ultrasound.  IMPRESSION:  1.  No acute osseous findings. 2.  Advanced facet disease at L4-L5 results in grade 1 anterolisthesis, moderate central stenosis and left greater than right lateral recess and foraminal stenosis.  Left L4 nerve root encroachment is likely. 3.  Moderate biforaminal stenosis at L5-S1 secondary to chronic degenerative disc disease with and with disc bulging and osteophytes.  Encroachment on either L5 nerve root is possible. 4.  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  Original Report Authenticated By: Gerrianne Scale, M.D.   Ct Pelvis Wo  Contrast  12/27/2010  *RADIOLOGY REPORT*  Clinical Data:  Low back, bilateral hip and sacral pain.  No known injury.  CT PELVIS WITHOUT CONTRAST  Technique:  Multidetector CT imaging of the pelvis was performed following the standard protocol without intravenous contrast.  Comparison:  None relevant.  Findings:  Bone detail is limited by body habitus and technique. There is mild osteopenia.  Lower lumbar spine degenerative changes are dictated separately.  There are mild degenerative changes at the sacroiliac joints and symphysis pubis.  No significant hip arthropathic changes are demonstrated.  There is no evidence of acute fracture, dislocation or femoral head osteonecrosis.  Scattered vascular calcifications are noted. No inflammatory changes are evident.  IMPRESSION:  1.  No acute pelvic findings identified. 2.  Mild degenerative changes at the symphysis pubis and sacroiliac joints. 3.  More advanced lower lumbar spine degenerative changes are dictated separately.  Original Report Authenticated By: Gerrianne Scale, M.D.   Dg Foot Complete Left  12/27/2010  *RADIOLOGY REPORT*  Clinical Data: Chronic bilateral ankle and foot pain.  No known injury.  History of gout.  LEFT FOOT - COMPLETE 3+ VIEW  Comparison: None.  Findings: There is joint space loss at the first metatarsal phalangeal joint associated with a hallux valgus deformity and erosion of the first metatarsal head medially.  No soft tissue calcifications are identified.  There is no evidence of acute fracture or subluxation.  No other significant arthropathic changes are seen.  IMPRESSION: First metatarsal phalangeal arthropathic changes may represent chronic gout or osteoarthritis.  No acute osseous findings.  Original Report Authenticated By: Gerrianne Scale, M.D.   Dg Foot Complete Right  12/27/2010  *RADIOLOGY REPORT*  Clinical Data: Chronic bilateral ankle and foot pain.  No known injury. History of gout.  RIGHT FOOT COMPLETE - 3+ VIEW   Comparison: None.  Findings: There is joint space loss at the first metatarsal phalangeal joint associated with a mild hallux valgus deformity. There is some erosion of the first metatarsal head medially.  No soft tissue calcifications are identified.  There is no evidence of acute fracture or dislocation.  Post-traumatic deformity of the proximal third phalanx is noted consistent with an old healed fracture.  IMPRESSION: First metatarsal phalangeal arthropathic changes could be secondary to gout or osteoarthritis.  Old fracture of the third proximal phalanx.  No acute osseous findings identified.  Original Report Authenticated By: Gerrianne Scale, M.D.    Procedures: none  Full Code   Hospital Course: See H&P for complete admission details. The patient is a 69 year old black female with history of gout who presented with difficulty walking mainly due to right ankle and foot pain. She had edema tenderness and decreased range of motion. X-rays show soft tissue swelling. She also had hyponatremia. She has chronic kidney disease and her creatinine usually ranges between 2 and 3. She also had evidence of urinary tract infection and trichomonas. She was admitted and started on saline, steroids, colchicine. By the next day, her joint pains resolved. She was also started on antibiotics. Urine culture was ordered but at this time is pending. She was given 2 g of metronidazole to treat the Trichomonas. She is not currently sexually active and reports having had a trichomonas infection previously. She worked with physical therapy who recommended home PT and a bedside commode for home use which I have ordered. her sodium improved. And her other medical problems remained stable. Her INR was subtherapeutic on admission and she will need a repeat INR check in a few weeks when she follows up with her primary care physician.   Discharge Exam:  Blood  pressure 109/69, pulse 90, temperature 97.4 F (36.3 C),  temperature source Oral, resp. rate 20, height 5\' 8"  (1.727 m), weight 88.27 kg (194 lb 9.6 oz), SpO2 92.00%.  Unchanged from 12/27/2010   Signed: Crista Curb L 12/28/2010, 11:26 AM

## 2010-12-28 NOTE — Progress Notes (Signed)
  CARE MANAGEMENT NOTE 12/28/2010  Patient:  [name] [name], [name] [name]   Account Number:  [id number]  Date Initiated:  12/28/2010  Documentation initiated by:  Andi Devon Assessment:   70 yr old female admitted with back pain  lives with her neice. plans to return home at d/c     Action/Plan:   Anticipated DC Date:  12/28/2010   Anticipated DC Plan:  HOME/SELF CARE      DC Planning Services  CM consult      Choice offered to / List presented to:  C-1 Patient           Status of service:   Medicare Important Message given?   (If response is "NO", the following Medicare IM given date fields will be blank) Date Medicare IM given:   Date Additional Medicare IM given:    Discharge Disposition:  HOME/SELF CARE  Per UR Regulation:    Comments:  12/28/2010 Mendel Corning rn bsn pt is d/c home.home health PT ordered per md. discussed with pt who states she had rather come in as outpt for her therapy. discussed with md order canceled for home health pt. appointment for outpt Pt arranged.

## 2010-12-30 ENCOUNTER — Encounter: Payer: Medicare Other | Admitting: *Deleted

## 2010-12-30 LAB — URINE CULTURE: Colony Count: >=100000

## 2011-01-03 ENCOUNTER — Encounter: Payer: Medicare Other | Admitting: *Deleted

## 2011-01-04 ENCOUNTER — Ambulatory Visit (HOSPITAL_COMMUNITY)
Admission: RE | Admit: 2011-01-04 | Discharge: 2011-01-04 | Disposition: A | Discharge status: Home / Self Care | Payer: Medicare Other | Source: Ambulatory Visit | Attending: Internal Medicine | Admitting: Internal Medicine

## 2011-01-04 DIAGNOSIS — M6281 Muscle weakness (generalized): Secondary | ICD-10-CM | POA: Insufficient documentation

## 2011-01-04 DIAGNOSIS — M545 Low back pain, unspecified: Secondary | ICD-10-CM | POA: Insufficient documentation

## 2011-01-04 DIAGNOSIS — R262 Difficulty in walking, not elsewhere classified: Secondary | ICD-10-CM | POA: Insufficient documentation

## 2011-01-04 DIAGNOSIS — R29898 Other symptoms and signs involving the musculoskeletal system: Secondary | ICD-10-CM | POA: Insufficient documentation

## 2011-01-04 DIAGNOSIS — IMO0001 Reserved for inherently not codable concepts without codable children: Secondary | ICD-10-CM | POA: Insufficient documentation

## 2011-01-04 NOTE — Progress Notes (Signed)
Physical Therapy Evaluation  Patient Details  Name: [name] [name] MRN: [id number] Date of Birth: [birth year]  Today's Date: 01/04/2011 Time: 1315-1405 Time Calculation (min): 50 min Visit#: 1  of 8   Re-eval: 02/03/11    Past Medical History:  Past Medical History  Diagnosis Date  . Nonischemic cardiomyopathy 1998    EF of 25% in 1998, 15% in 2006; catheterization in 3/07-50% D1; luminal      irregularities in the other vessels; PA-65/25. 20% EF in 04/2007 with moderate MR and pulmonary htn; AICD/biventricular pacing--05/2005  . Hypertension     normal CBC, TSH in 03/2009  . Atrial fibrillation     plus nonsustained ventricular tachycardia --> amiodarone treatment  . Nonsustained ventricular tachycardia   . OSA (obstructive sleep apnea)   . Chronic kidney disease, stage 4, severely decreased GFR     Creatinine of 2.1 in 09/2008, 1.9 in 11/1998 and, 2.3 in 05/2010  . Venous stasis   . Gout     Uric acid of 12.5 in 03/2009  . Chronic anticoagulation   . History of lupus   . CHF (congestive heart failure)   . Chronic kidney disease    Past Surgical History:  Past Surgical History  Procedure Date  . Appendectomy 1987  . Incision and drainage intra oral abscess 1991    submandibular abscess  . Cardiac defibrillator placement 05/2005    Medtronic Insync, Biventricular    Subjective Symptoms/Limitations Symptoms: [name] [name] states that she is getting stronger everyday.  The patient states that she had a episode of gout about nine months ago and she has not been able to use her right hand since.  The  patient states that about a month and a half ago she had  significant mm spasm in her low back, she also had gout to attack her ankles and she was flat on her back for about a month and a half.  She is slowly improving but is still unsteady.  She states that two weeks ago she could not even stand so she called the EMS and was admitted into the hospital .  She was discharged Wed. and now  is to recieve out-patient PT.  The patient states that she was using a walker but in the past week she has swithed to the cane but a month and a half ago she did not need to use any assistive device.   How long can you sit comfortably?: The patient states that she is able to sit for fifteen to twenty minutes. How long can you stand comfortably?: The patient states that she has not stood yet she is afraid to stand in the shower due to balance problems. How long can you walk comfortably?: The patient has only walked around in her house for short distances.   Pain Assessment Currently in Pain?: Yes (Low back feels like pressure not pain; hand numb.) Pain Location: Back Pain Orientation: Right    Assessment RLE Strength Right Hip Flexion:  (4+) Right Hip Extension: 3/5 Right Hip ABduction: 3/5 Right Hip ADduction: 4/5 Right Knee Flexion: 3-/5 Right Knee Extension: 5/5 Right Ankle Dorsiflexion: 5/5 LLE Strength Left Hip Flexion: 4/5 Left Hip Extension: 3/5 Left Hip ABduction: 4/5 Left Hip ADduction: 4/5 Left Knee Flexion: 3-/5 Left Knee Extension: 5/5 Left Ankle Dorsiflexion: 5/5  Berg balance test 48/56  Exercise/Treatments   Seated Long Arc Quad: Both;10 reps Supine Straight Leg Raises: Both;5 reps Sidelying Hip ABduction: Both;5 reps Prone  Hamstring Curl: 5  reps Hip Extension: Both;5 reps    Physical Therapy Assessment and Plan PT Assessment and Plan Clinical Impression Statement: Pt continues to become dyspneic with exertion.  PT ambulating with cane with decrased strength of LE B and decreased dynamic balance.  Pt will benefit from skilled physical therapy to address the above issures and maximize functional potential. Rehab Potential: Good Clinical Impairments Affecting Rehab Potential: strength, balance  PT Frequency: Min 2X/week PT Duration: 4 weeks PT Treatment/Interventions: Therapeutic activities PT Plan: See pt two times a week.  Next treatment start with  treadmill, functional squats, heel raises, SLS, standing hip ab/ext exercises.  3rd treatment add rockerboard, tandem gt, retro gt and sit to stand.    Goals Home Exercise Program Pt will Perform Home Exercise Program: Independently PT Short Term Goals Time to Complete Short Term Goals: 2 weeks PT Short Term Goal 1: Pt to ambulate in house without an assistive device. PT Short Term Goal 2: Pt able to stand for 15 min in order to take a shower without fear of falling PT Short Term Goal 3: Pt to begin walling program at home. PT Long Term Goals Time to Complete Long Term Goals: 4 weeks PT Long Term Goal 1: Pt strengthto be increased one grade. PT Long Term Goal 2: Pt to be able to walk without an assistive device for 30 minutes. Long Term Goal 3: Pt able to stand for 30 minutes in order to make a meal.  Problem List Patient Active Problem List  Diagnoses  . OSTEOARTHRITIS, MODERATE  . SLEEP APNEA  . AUTOMATIC IMPLANTABLE CARDIAC DEFIBRILLATOR SITU  . Nonischemic cardiomyopathy  . Hypertension  . Atrial fibrillation  . Chronic anticoagulation  . Chronic kidney disease, stage 4, severely decreased GFR  . Gait abnormality  . Back pain  . Dehydration  . Hyponatremia  . UTI (lower urinary tract infection)  . Gout flare  . Trichomonal vaginitis  . Bilateral leg weakness    PT - End of Session Activity Tolerance: Patient tolerated treatment well General Behavior During Session: [facility] for tasks performed Cognition: [facility] for tasks performed   RUSSELL,CINDY 01/04/2011, 2:18 PM  Physician Documentation Your signature is required to indicate approval of the treatment plan as stated above.  Please sign and either send electronically or make a copy of this report for your files and return this physician signed original.   Please mark one 1.__approve of plan  2. ___approve of plan with the following conditions.   ______________________________                                                           _____________________ Physician Signature                                                                                                             Date

## 2011-01-04 NOTE — Patient Instructions (Addendum)
HEP

## 2011-01-06 ENCOUNTER — Encounter: Payer: Self-pay | Admitting: *Deleted

## 2011-01-07 ENCOUNTER — Ambulatory Visit (HOSPITAL_COMMUNITY)
Admission: RE | Admit: 2011-01-07 | Discharge: 2011-01-07 | Disposition: A | Discharge status: Home / Self Care | Payer: Medicare Other | Source: Ambulatory Visit | Attending: Physical Therapy | Admitting: Physical Therapy

## 2011-01-07 DIAGNOSIS — R29898 Other symptoms and signs involving the musculoskeletal system: Secondary | ICD-10-CM

## 2011-01-07 NOTE — Progress Notes (Signed)
Physical Therapy Treatment Patient Details  Name: [name] [name] MRN: [id number] Date of Birth: [birth year]  Today's Date: 01/07/2011 Time: 4098-1191 Time Calculation (min): 40 min Visit#: 2  of 8   Re-eval: 02/03/11    Subjective: Symptoms/Limitations Symptoms: Pt states that she is not having any pain states that she started her exercieses. Pain Assessment Currently in Pain?: No/denies  Exercise/Treatments   Aerobic Tread Mill: . x 5' working on heel/toe gt;=stride  Standing Heel Raises: 10 reps Knee Flexion: Both;10 reps Terminal Knee Extension: Strengthening;Both;10 reps Functional Squat: 10 reps SLS: 5 x @  hip ab/ext x 10 rep Seated Long Arc Quad Weight: 3 lbs.   Physical Therapy Assessment and Plan PT Assessment and Plan Clinical Impression Statement: Pt needed to take one break during treatment PT Plan: begin sit to stand/ lunges next treatment.    Goals    Problem List Patient Active Problem List  Diagnoses  . OSTEOARTHRITIS, MODERATE  . SLEEP APNEA  . AUTOMATIC IMPLANTABLE CARDIAC DEFIBRILLATOR SITU  . Nonischemic cardiomyopathy  . Hypertension  . Atrial fibrillation  . Chronic anticoagulation  . Chronic kidney disease, stage 4, severely decreased GFR  . Gait abnormality  . Back pain  . Dehydration  . Hyponatremia  . UTI (lower urinary tract infection)  . Gout flare  . Trichomonal vaginitis  . Bilateral leg weakness    PT - End of Session Activity Tolerance: Patient tolerated treatment well General Behavior During Session: [facility] for tasks performed Cognition: [facility] for tasks performed  RUSSELL,CINDY 01/07/2011, 2:30 PM

## 2011-01-11 ENCOUNTER — Ambulatory Visit (HOSPITAL_COMMUNITY)
Admission: RE | Admit: 2011-01-11 | Discharge: 2011-01-11 | Disposition: A | Discharge status: Home / Self Care | Payer: Medicare Other | Source: Ambulatory Visit | Attending: Internal Medicine | Admitting: Internal Medicine

## 2011-01-11 NOTE — Progress Notes (Signed)
Physical Therapy Treatment Patient Details  Name: [name] [name] MRN: [id number] Date of Birth: [birth year]  Today's Date: 01/11/2011 Time: 6578-4696 Time Calculation (min): 42 min Visit#: 3  of 8   Re-eval: 02/03/11 Charges: Gait x 5' Neuro re-ed x 8' Therex x 27'   Subjective: Symptoms/Limitations Symptoms: Pt reprots that she is only having pain in her L hip today. Pain Assessment Currently in Pain?: Yes Pain Score:   7 Pain Location: Hip Pain Orientation: Left   Exercise/Treatments Aerobic Tread Mill: . x 5' working on heel/toe gt;=stride Standing Heel Raises: 15 reps Knee Flexion: 15 reps;Both Forward Lunges: 10 reps;Both Terminal Knee Extension: 10 reps;Theraband Theraband Level (Terminal Knee Extension): Level 4 (Blue) Functional Squat: 15 reps Rocker Board: 2 minutes SLS: 2x 15" B with 1 HHA assist Other Standing Knee Exercises: hip ab/ext x 15 rep Other Standing Knee Exercises: Retro gt 1 RT; Tandem gt 1 Rt Seated Long Arc Quad: 15 reps;Both;Weights Long Arc Quad Weight: 3 lbs.  Physical Therapy Assessment and Plan PT Assessment and Plan Clinical Impression Statement: Pt with improved stride length and heel-toe gait this session. Pt with some LOB with tandem gait. Pt able recover from LOB independently. PT Plan: Continue to progress per PT POC.     Problem List Patient Active Problem List  Diagnoses  . OSTEOARTHRITIS, MODERATE  . SLEEP APNEA  . AUTOMATIC IMPLANTABLE CARDIAC DEFIBRILLATOR SITU  . Nonischemic cardiomyopathy  . Hypertension  . Atrial fibrillation  . Chronic anticoagulation  . Chronic kidney disease, stage 4, severely decreased GFR  . Gait abnormality  . Back pain  . Dehydration  . Hyponatremia  . UTI (lower urinary tract infection)  . Gout flare  . Trichomonal vaginitis  . Bilateral leg weakness    PT - End of Session Activity Tolerance: Patient tolerated treatment well General Behavior During Session: [facility] for tasks  performed Cognition: [facility] for tasks performed  Antonieta Iba 01/11/2011, 1:59 PM

## 2011-01-13 ENCOUNTER — Ambulatory Visit (HOSPITAL_COMMUNITY)
Admission: RE | Admit: 2011-01-13 | Discharge: 2011-01-13 | Disposition: A | Discharge status: Home / Self Care | Payer: Medicare Other | Source: Ambulatory Visit | Attending: Internal Medicine | Admitting: Internal Medicine

## 2011-01-13 ENCOUNTER — Ambulatory Visit (INDEPENDENT_AMBULATORY_CARE_PROVIDER_SITE_OTHER): Payer: Medicare Other | Admitting: *Deleted

## 2011-01-13 DIAGNOSIS — R29898 Other symptoms and signs involving the musculoskeletal system: Secondary | ICD-10-CM

## 2011-01-13 DIAGNOSIS — I4891 Unspecified atrial fibrillation: Secondary | ICD-10-CM

## 2011-01-13 DIAGNOSIS — Z7901 Long term (current) use of anticoagulants: Secondary | ICD-10-CM

## 2011-01-13 LAB — POCT INR: INR: 2.2

## 2011-01-13 NOTE — Progress Notes (Signed)
Physical Therapy Treatment Patient Details  Name: [name] [name] MRN: [id number] Date of Birth: [birth year]  Today's Date: 01/13/2011 Time: 4098-1191 Time Calculation (min): 50 min Visit#: 4  of 8   Re-eval: 02/03/11    Subjective: Symptoms/Limitations Symptoms: I don't have pain just a lot of pressure.  Precautions/Restrictions     Mobility (including Balance)       Exercise/Treatments   Aerobic Tread Mill: 1. 6' (1.98mph x 6')   Standing Heel Raises: 15 reps Knee Flexion: Strengthening;Both;10 reps;Limitations Knee Flexion Limitations: 3# Forward Lunges: 10 reps Functional Squat: 15 reps Rocker Board: 2 minutes SLS: 5x Other Standing Knee Exercises:  (hip ab/ext with 3# x 10) Other Standing Knee Exercises: tandem and retro gt 2 RT @ Seated Long Arc Quad: Both;10 reps;Weights Long Arc Quad Weight: 5 lbs. Other Seated Knee Exercises: sit to standx 5 on each      Physical Therapy Assessment and Plan PT Assessment and Plan Clinical Impression Statement: Pt improved with gait and balance but needed three resting breaks during treatment time. Rehab Potential: Good PT Plan: begin side lunges next treatment as well as lateral step ups.    Goals    Problem List Patient Active Problem List  Diagnoses  . OSTEOARTHRITIS, MODERATE  . SLEEP APNEA  . AUTOMATIC IMPLANTABLE CARDIAC DEFIBRILLATOR SITU  . Nonischemic cardiomyopathy  . Hypertension  . Atrial fibrillation  . Chronic anticoagulation  . Chronic kidney disease, stage 4, severely decreased GFR  . Gait abnormality  . Back pain  . Dehydration  . Hyponatremia  . UTI (lower urinary tract infection)  . Gout flare  . Trichomonal vaginitis  . Bilateral leg weakness    PT - End of Session Equipment Utilized During Treatment: Gait belt Activity Tolerance: Patient tolerated treatment well General Behavior During Session: [facility] for tasks performed Cognition: [facility] for tasks  performed  RUSSELL,CINDY 01/13/2011, 3:21 PM

## 2011-01-19 ENCOUNTER — Ambulatory Visit (HOSPITAL_COMMUNITY): Payer: Medicare Other | Admitting: Physical Therapy

## 2011-01-21 ENCOUNTER — Inpatient Hospital Stay (HOSPITAL_COMMUNITY): Admission: RE | Admit: 2011-01-21 | Payer: Medicare Other | Source: Ambulatory Visit | Admitting: Physical Therapy

## 2011-01-26 ENCOUNTER — Ambulatory Visit (HOSPITAL_COMMUNITY): Payer: Medicare Other | Admitting: *Deleted

## 2011-01-27 ENCOUNTER — Ambulatory Visit (HOSPITAL_COMMUNITY)
Admission: RE | Admit: 2011-01-27 | Discharge: 2011-01-27 | Disposition: A | Discharge status: Home / Self Care | Payer: Medicare Other | Source: Ambulatory Visit | Attending: Internal Medicine | Admitting: Internal Medicine

## 2011-01-27 DIAGNOSIS — M545 Low back pain, unspecified: Secondary | ICD-10-CM | POA: Insufficient documentation

## 2011-01-27 DIAGNOSIS — IMO0001 Reserved for inherently not codable concepts without codable children: Secondary | ICD-10-CM | POA: Insufficient documentation

## 2011-01-27 DIAGNOSIS — R262 Difficulty in walking, not elsewhere classified: Secondary | ICD-10-CM | POA: Insufficient documentation

## 2011-01-27 DIAGNOSIS — M6281 Muscle weakness (generalized): Secondary | ICD-10-CM | POA: Insufficient documentation

## 2011-01-27 NOTE — Progress Notes (Addendum)
Physical Therapy Treatment Patient Details  Name: [name] [name] MRN: [id number] Date of Birth: 12/04/1940  Today's Date: 01/27/2011 Time: 4098-1191 Time Calculation (min): 43 min Visit#: 5  of 8   Re-eval: 02/03/11 Charges: Therex 32' NMR x 8'  Subjective: Symptoms/Limitations Symptoms: Pt reports she is not having pain but she continues to feel pressure on the left side of her low back.   Exercise/Treatments Aerobic Tread Mill: 1.6 x 3' then moved down to 1.4 at pt request x 2' (Pt only able to tolerate 5' on TM) To improve activity tolerance Standing Heel Raises: 20 reps Knee Flexion: 15 reps Knee Flexion Limitations: 3# Forward Lunges: 10 reps Terminal Knee Extension: 15 reps;Theraband Theraband Level (Terminal Knee Extension): Level 4 (Blue) Functional Squat: 15 reps Rocker Board: 2 minutes Other Standing Knee Exercises: tandem and retro gt 2 RT Seated Long Arc Quad: 15 reps;Both Long Arc Quad Weight: 5 lbs. Other Seated Knee Exercises: sit to standx 5 on each from 20" surface w/o UE  Physical Therapy Assessment and Plan PT Assessment and Plan Clinical Impression Statement: Pt requires multimodal cueing for correct form with standing TKE. PT requires 4 rest breaks throughout session. Pt does seem to have increased activity tolerance. Pt requires quite a bit of cueing to correctly perform lunges. Pt with improved gait pattern on TM with decreased need for cueing. TM completed today to improve activity tolerance. Pt only able to tolerate 5' on TM today secondary to completing it at the end of today's session. PT Plan: Continue to progress per PT POC begin lateral lunges and step ups next session.     Problem List Patient Active Problem List  Diagnoses  . OSTEOARTHRITIS, MODERATE  . SLEEP APNEA  . AUTOMATIC IMPLANTABLE CARDIAC DEFIBRILLATOR SITU  . Nonischemic cardiomyopathy  . Hypertension  . Campath-induced atrial fibrillation  . Chronic anticoagulation  . Chronic  kidney disease, stage 4, severely decreased GFR  . Gait abnormality  . Back pain  . Dehydration  . Hyponatremia  . UTI (lower urinary tract infection)  . Gout flare  . Trichomonal vaginitis  . Bilateral leg weakness    PT - End of Session Activity Tolerance: Patient tolerated treatment well General Behavior During Session: [facility] for tasks performed Cognition: [facility] for tasks performed  Antonieta Iba 01/27/2011, 3:27 PM

## 2011-02-01 ENCOUNTER — Ambulatory Visit (HOSPITAL_COMMUNITY): Payer: Medicare Other | Admitting: *Deleted

## 2011-02-02 ENCOUNTER — Telehealth (HOSPITAL_COMMUNITY): Payer: Self-pay

## 2011-02-03 ENCOUNTER — Encounter: Payer: Self-pay | Admitting: Internal Medicine

## 2011-02-03 ENCOUNTER — Encounter: Payer: Medicare Other | Admitting: *Deleted

## 2011-02-03 ENCOUNTER — Ambulatory Visit (HOSPITAL_COMMUNITY): Payer: Medicare Other | Admitting: Physical Therapy

## 2011-02-03 ENCOUNTER — Ambulatory Visit (INDEPENDENT_AMBULATORY_CARE_PROVIDER_SITE_OTHER): Payer: Medicare Other | Admitting: *Deleted

## 2011-02-03 ENCOUNTER — Telehealth: Payer: Self-pay | Admitting: Internal Medicine

## 2011-02-03 DIAGNOSIS — I509 Heart failure, unspecified: Secondary | ICD-10-CM

## 2011-02-03 DIAGNOSIS — I428 Other cardiomyopathies: Secondary | ICD-10-CM

## 2011-02-03 DIAGNOSIS — I4891 Unspecified atrial fibrillation: Secondary | ICD-10-CM

## 2011-02-03 LAB — ICD DEVICE OBSERVATION
AL IMPEDENCE ICD: 437 Ohm
ATRIAL PACING ICD: 96.92 pct
BAMS-0001: 170 {beats}/min
CHARGE TIME: 8.318 s
LV LEAD THRESHOLD: 0.875 V
PACEART VT: 0
RV LEAD AMPLITUDE: 19.375 mv
TOT-0001: 1
TOT-0002: 0
TZAT-0001ATACH: 2
TZAT-0001FASTVT: 1
TZAT-0001SLOWVT: 2
TZAT-0002ATACH: NEGATIVE
TZAT-0002ATACH: NEGATIVE
TZAT-0004SLOWVT: 8
TZAT-0005SLOWVT: 88 pct
TZAT-0005SLOWVT: 91 pct
TZAT-0018ATACH: NEGATIVE
TZAT-0018ATACH: NEGATIVE
TZAT-0018SLOWVT: NEGATIVE
TZAT-0018SLOWVT: NEGATIVE
TZAT-0019ATACH: 6 V
TZAT-0019ATACH: 6 V
TZAT-0020FASTVT: 1.5 ms
TZON-0003SLOWVT: 350 ms
TZON-0003VSLOWVT: 400 ms
TZON-0004SLOWVT: 32
TZON-0004VSLOWVT: 36
TZST-0001ATACH: 5
TZST-0001ATACH: 6
TZST-0001FASTVT: 2
TZST-0001FASTVT: 3
TZST-0001FASTVT: 4
TZST-0001SLOWVT: 3
TZST-0001SLOWVT: 6
TZST-0002ATACH: NEGATIVE
TZST-0002FASTVT: NEGATIVE
TZST-0002FASTVT: NEGATIVE
TZST-0003SLOWVT: 35 J
VENTRICULAR PACING ICD: 99.34 pct

## 2011-02-03 NOTE — Telephone Encounter (Signed)
New Msg: pt calling to inform nurse/md/device clinic that tablet pt is taking is a Vitamin D tablet. Please return pt call to discuss further if necessary.

## 2011-02-03 NOTE — Telephone Encounter (Signed)
Spoke w/pt to get mg and how pt is taken. Updated med list with vitamin d added.

## 2011-02-03 NOTE — Progress Notes (Signed)
icd check in clinic  

## 2011-02-08 ENCOUNTER — Ambulatory Visit (HOSPITAL_COMMUNITY): Payer: Medicare Other | Admitting: Physical Therapy

## 2011-02-10 ENCOUNTER — Ambulatory Visit (HOSPITAL_COMMUNITY)
Admission: RE | Admit: 2011-02-10 | Discharge: 2011-02-10 | Disposition: A | Discharge status: Home / Self Care | Payer: Medicare Other | Source: Ambulatory Visit | Attending: Physical Therapy | Admitting: Physical Therapy

## 2011-02-25 ENCOUNTER — Other Ambulatory Visit: Payer: Self-pay | Admitting: Adult Health

## 2011-02-25 NOTE — Telephone Encounter (Signed)
Pt wants allopurinol, per Tomi pt is to get from pcp.

## 2011-03-09 ENCOUNTER — Ambulatory Visit (INDEPENDENT_AMBULATORY_CARE_PROVIDER_SITE_OTHER): Payer: Medicare Other | Admitting: *Deleted

## 2011-03-09 DIAGNOSIS — Z7901 Long term (current) use of anticoagulants: Secondary | ICD-10-CM

## 2011-03-09 DIAGNOSIS — I4891 Unspecified atrial fibrillation: Secondary | ICD-10-CM

## 2011-03-09 LAB — POCT INR: INR: 4.5

## 2011-03-23 ENCOUNTER — Ambulatory Visit (INDEPENDENT_AMBULATORY_CARE_PROVIDER_SITE_OTHER): Payer: Medicare Other | Admitting: *Deleted

## 2011-03-23 DIAGNOSIS — I4891 Unspecified atrial fibrillation: Secondary | ICD-10-CM

## 2011-03-23 DIAGNOSIS — Z7901 Long term (current) use of anticoagulants: Secondary | ICD-10-CM

## 2011-04-13 ENCOUNTER — Ambulatory Visit (INDEPENDENT_AMBULATORY_CARE_PROVIDER_SITE_OTHER): Payer: Medicare Other | Admitting: *Deleted

## 2011-04-13 DIAGNOSIS — Z7901 Long term (current) use of anticoagulants: Secondary | ICD-10-CM

## 2011-04-13 DIAGNOSIS — I4891 Unspecified atrial fibrillation: Secondary | ICD-10-CM

## 2011-04-13 LAB — POCT INR: INR: 2.5

## 2011-04-21 ENCOUNTER — Other Ambulatory Visit: Payer: Self-pay | Admitting: Adult Health

## 2011-05-04 ENCOUNTER — Ambulatory Visit (INDEPENDENT_AMBULATORY_CARE_PROVIDER_SITE_OTHER): Payer: Medicare Other | Admitting: *Deleted

## 2011-05-04 DIAGNOSIS — I4891 Unspecified atrial fibrillation: Secondary | ICD-10-CM

## 2011-05-04 DIAGNOSIS — Z7901 Long term (current) use of anticoagulants: Secondary | ICD-10-CM

## 2011-05-04 LAB — POCT INR: INR: 2.9

## 2011-05-05 ENCOUNTER — Encounter: Payer: Medicare Other | Admitting: *Deleted

## 2011-05-11 ENCOUNTER — Encounter: Payer: Self-pay | Admitting: *Deleted

## 2011-05-13 ENCOUNTER — Encounter: Payer: Self-pay | Admitting: Internal Medicine

## 2011-05-13 ENCOUNTER — Ambulatory Visit (INDEPENDENT_AMBULATORY_CARE_PROVIDER_SITE_OTHER): Payer: Medicare Other | Admitting: *Deleted

## 2011-05-13 DIAGNOSIS — I428 Other cardiomyopathies: Secondary | ICD-10-CM

## 2011-05-13 DIAGNOSIS — I509 Heart failure, unspecified: Secondary | ICD-10-CM

## 2011-05-13 DIAGNOSIS — I4891 Unspecified atrial fibrillation: Secondary | ICD-10-CM

## 2011-05-20 LAB — REMOTE ICD DEVICE
AL THRESHOLD: 1.5 V
ATRIAL PACING ICD: 98.64 pct
BAMS-0001: 170 {beats}/min
LV LEAD IMPEDENCE ICD: 399 Ohm
LV LEAD THRESHOLD: 1 V
PACEART VT: 0
RV LEAD THRESHOLD: 0.75 V
TOT-0006: 20120601000000
TZAT-0001ATACH: 1
TZAT-0001ATACH: 2
TZAT-0001SLOWVT: 1
TZAT-0001SLOWVT: 2
TZAT-0002ATACH: NEGATIVE
TZAT-0002ATACH: NEGATIVE
TZAT-0002ATACH: NEGATIVE
TZAT-0002FASTVT: NEGATIVE
TZAT-0004SLOWVT: 8
TZAT-0004SLOWVT: 8
TZAT-0005SLOWVT: 88 pct
TZAT-0005SLOWVT: 91 pct
TZAT-0011SLOWVT: 10 ms
TZAT-0011SLOWVT: 10 ms
TZAT-0012ATACH: 150 ms
TZAT-0013SLOWVT: 3
TZAT-0013SLOWVT: 3
TZAT-0018ATACH: NEGATIVE
TZAT-0018ATACH: NEGATIVE
TZAT-0018FASTVT: NEGATIVE
TZAT-0018SLOWVT: NEGATIVE
TZAT-0018SLOWVT: NEGATIVE
TZAT-0019ATACH: 6 V
TZAT-0019FASTVT: 8 V
TZAT-0020ATACH: 1.5 ms
TZAT-0020FASTVT: 1.5 ms
TZON-0003ATACH: 350 ms
TZON-0004SLOWVT: 32
TZON-0004VSLOWVT: 36
TZON-0005SLOWVT: 16
TZST-0001ATACH: 4
TZST-0001FASTVT: 2
TZST-0001FASTVT: 3
TZST-0001FASTVT: 6
TZST-0001SLOWVT: 3
TZST-0001SLOWVT: 4
TZST-0001SLOWVT: 6
TZST-0002ATACH: NEGATIVE
TZST-0002ATACH: NEGATIVE
TZST-0002FASTVT: NEGATIVE
TZST-0002FASTVT: NEGATIVE
TZST-0003SLOWVT: 35 J
VENTRICULAR PACING ICD: 99.15 pct
VF: 0

## 2011-05-24 NOTE — Progress Notes (Signed)
Remote icd check w/icm  

## 2011-06-01 ENCOUNTER — Ambulatory Visit (INDEPENDENT_AMBULATORY_CARE_PROVIDER_SITE_OTHER): Payer: Medicare Other | Admitting: *Deleted

## 2011-06-01 DIAGNOSIS — Z7901 Long term (current) use of anticoagulants: Secondary | ICD-10-CM

## 2011-06-01 DIAGNOSIS — I4891 Unspecified atrial fibrillation: Secondary | ICD-10-CM

## 2011-06-13 ENCOUNTER — Encounter: Payer: Self-pay | Admitting: *Deleted

## 2011-06-16 ENCOUNTER — Other Ambulatory Visit: Payer: Self-pay | Admitting: Cardiology

## 2011-06-22 ENCOUNTER — Other Ambulatory Visit: Payer: Self-pay | Admitting: Adult Health

## 2011-06-22 ENCOUNTER — Ambulatory Visit (INDEPENDENT_AMBULATORY_CARE_PROVIDER_SITE_OTHER): Payer: Medicare Other | Admitting: *Deleted

## 2011-06-22 DIAGNOSIS — I4891 Unspecified atrial fibrillation: Secondary | ICD-10-CM

## 2011-06-22 DIAGNOSIS — Z7901 Long term (current) use of anticoagulants: Secondary | ICD-10-CM

## 2011-07-13 ENCOUNTER — Ambulatory Visit (INDEPENDENT_AMBULATORY_CARE_PROVIDER_SITE_OTHER): Payer: Medicare Other | Admitting: *Deleted

## 2011-07-13 DIAGNOSIS — Z7901 Long term (current) use of anticoagulants: Secondary | ICD-10-CM

## 2011-07-13 DIAGNOSIS — I4891 Unspecified atrial fibrillation: Secondary | ICD-10-CM

## 2011-07-13 LAB — POCT INR: INR: 2

## 2011-07-28 ENCOUNTER — Other Ambulatory Visit: Payer: Self-pay | Admitting: Cardiology

## 2011-08-03 ENCOUNTER — Ambulatory Visit (INDEPENDENT_AMBULATORY_CARE_PROVIDER_SITE_OTHER): Payer: Medicare Other | Admitting: *Deleted

## 2011-08-03 DIAGNOSIS — I4891 Unspecified atrial fibrillation: Secondary | ICD-10-CM

## 2011-08-03 DIAGNOSIS — Z7901 Long term (current) use of anticoagulants: Secondary | ICD-10-CM

## 2011-08-03 LAB — POCT INR: INR: 1.5

## 2011-08-29 ENCOUNTER — Ambulatory Visit (INDEPENDENT_AMBULATORY_CARE_PROVIDER_SITE_OTHER): Payer: Medicare Other | Admitting: *Deleted

## 2011-08-29 DIAGNOSIS — I4891 Unspecified atrial fibrillation: Secondary | ICD-10-CM

## 2011-08-29 DIAGNOSIS — Z7901 Long term (current) use of anticoagulants: Secondary | ICD-10-CM

## 2011-08-29 LAB — POCT INR: INR: 2.9

## 2011-08-30 ENCOUNTER — Ambulatory Visit (INDEPENDENT_AMBULATORY_CARE_PROVIDER_SITE_OTHER): Payer: Medicare Other | Admitting: Internal Medicine

## 2011-08-30 ENCOUNTER — Encounter: Payer: Self-pay | Admitting: Internal Medicine

## 2011-08-30 VITALS — BP 120/83 | HR 91 | Wt 190.0 lb

## 2011-08-30 DIAGNOSIS — I428 Other cardiomyopathies: Secondary | ICD-10-CM

## 2011-08-30 DIAGNOSIS — Z95810 Presence of automatic (implantable) cardiac defibrillator: Secondary | ICD-10-CM

## 2011-08-30 DIAGNOSIS — I4891 Unspecified atrial fibrillation: Secondary | ICD-10-CM

## 2011-08-30 DIAGNOSIS — I10 Essential (primary) hypertension: Secondary | ICD-10-CM

## 2011-08-30 LAB — ICD DEVICE OBSERVATION
AL IMPEDENCE ICD: 437 Ohm
ATRIAL PACING ICD: 97.49 pct
BATTERY VOLTAGE: 3.1048 V
CHARGE TIME: 8.648 s
LV LEAD IMPEDENCE ICD: 494 Ohm
RV LEAD IMPEDENCE ICD: 513 Ohm
TOT-0002: 0
TOT-0006: 20120601000000
TZAT-0001ATACH: 1
TZAT-0001ATACH: 3
TZAT-0001FASTVT: 1
TZAT-0001SLOWVT: 1
TZAT-0001SLOWVT: 2
TZAT-0002ATACH: NEGATIVE
TZAT-0002ATACH: NEGATIVE
TZAT-0002FASTVT: NEGATIVE
TZAT-0005SLOWVT: 88 pct
TZAT-0005SLOWVT: 91 pct
TZAT-0012ATACH: 150 ms
TZAT-0012ATACH: 150 ms
TZAT-0012FASTVT: 200 ms
TZAT-0012SLOWVT: 200 ms
TZAT-0013SLOWVT: 3
TZAT-0013SLOWVT: 3
TZAT-0018ATACH: NEGATIVE
TZAT-0018ATACH: NEGATIVE
TZAT-0018SLOWVT: NEGATIVE
TZAT-0018SLOWVT: NEGATIVE
TZAT-0019ATACH: 6 V
TZAT-0019ATACH: 6 V
TZAT-0019SLOWVT: 8 V
TZAT-0019SLOWVT: 8 V
TZAT-0020ATACH: 1.5 ms
TZAT-0020FASTVT: 1.5 ms
TZAT-0020SLOWVT: 1.5 ms
TZON-0003ATACH: 350 ms
TZON-0004SLOWVT: 32
TZON-0005SLOWVT: 16
TZST-0001ATACH: 4
TZST-0001ATACH: 5
TZST-0001ATACH: 6
TZST-0001FASTVT: 3
TZST-0001FASTVT: 4
TZST-0001FASTVT: 5
TZST-0001SLOWVT: 3
TZST-0001SLOWVT: 6
TZST-0002ATACH: NEGATIVE
TZST-0002ATACH: NEGATIVE
TZST-0002FASTVT: NEGATIVE
TZST-0002FASTVT: NEGATIVE
TZST-0002FASTVT: NEGATIVE
TZST-0002FASTVT: NEGATIVE
TZST-0003SLOWVT: 35 J
TZST-0003SLOWVT: 35 J
VENTRICULAR PACING ICD: 98.51 pct

## 2011-08-30 NOTE — Assessment & Plan Note (Signed)
She appears to be maintaining NSR on low dose amiodarone. Will continue.

## 2011-08-30 NOTE — Assessment & Plan Note (Signed)
Her blood pressure appears to be well controlled. She will continue her current meds and maintain a low sodium diet. 

## 2011-08-30 NOTE — Progress Notes (Signed)
HPI [name] [name] returns today for followup. She has a longstanding dilated cardiomyopathy, chronic systolic CHF, HTN, and PAF. In the interim, she has done well. She denies chest pain and her CHF is class 1-2. No syncope or ICD shocks. No edema. She denies dietary indiscretion. Allergies  Allergen Reactions  . Penicillins Other (See Comments)    Stiffness     Current Outpatient Prescriptions  Medication Sig Dispense Refill  . acetaminophen (TYLENOL) 500 MG tablet Take 500 mg by mouth as needed. For pain      . ALDACTONE 25 MG tablet TAKE ONE TABLET BY MOUTH ONCE DAILY.  30 each  0  . allopurinol (ZYLOPRIM) 100 MG tablet Take 100 mg by mouth 2 (two) times daily.        Marland Kitchen amiodarone (PACERONE) 200 MG tablet Take by mouth daily. Dose decrease /Take 1 tablet Monday through Friday and 1/2 tablet on Saturday and Sunday       . aspirin EC 81 MG tablet Take 81 mg by mouth daily.        Marland Kitchen buPROPion (WELLBUTRIN SR) 100 MG 12 hr tablet Take 100 mg by mouth daily.        . colchicine 0.6 MG tablet Take 0.6 mg by mouth as needed.      . COREG 25 MG tablet TAKE 1 TABLET BY MOUTH TWICE A DAY.  60 each  6  . COUMADIN 5 MG tablet TAKE 1-2 TABLETS BY MOUTH DAILY OR AS DIRECTED BY COUMADIN CLINIC.  45 each  3  . digoxin (LANOXIN) 0.125 MG tablet Take 125 mcg by mouth daily.      . enalapril (VASOTEC) 10 MG tablet Take 10 mg by mouth daily.       Marland Kitchen HYDROcodone-acetaminophen (VICODIN) 5-500 MG per tablet Take 1 tablet by mouth daily as needed. For pain      . LASIX 40 MG tablet TAKE 1 TABLET BY MOUTH   TWICE A DAY.  60 each  0  . Vitamin D, Ergocalciferol, (DRISDOL) 50000 UNITS CAPS Take 50,000 Units by mouth every 7 (seven) days.         Past Medical History  Diagnosis Date  . Nonischemic cardiomyopathy 1998    EF of 25% in 1998, 15% in 2006; catheterization in 3/07-50% D1; luminal      irregularities in the other vessels; PA-65/25. 20% EF in 04/2007 with moderate MR and pulmonary htn; AICD/biventricular  pacing--05/2005  . Hypertension     normal CBC, TSH in 03/2009  . Atrial fibrillation     plus nonsustained ventricular tachycardia --> amiodarone treatment  . Nonsustained ventricular tachycardia   . OSA (obstructive sleep apnea)   . Chronic kidney disease, stage 4, severely decreased GFR     Creatinine of 2.1 in 09/2008, 1.9 in 11/1998 and, 2.3 in 05/2010  . Venous stasis   . Gout     Uric acid of 12.5 in 03/2009  . Chronic anticoagulation   . History of lupus   . CHF (congestive heart failure)   . Chronic kidney disease     ROS:   All systems reviewed and negative except as noted in the HPI.   Past Surgical History  Procedure Date  . Appendectomy 1987  . Incision and drainage intra oral abscess 1991    submandibular abscess  . Cardiac defibrillator placement 05/2005    Medtronic Insync, Biventricular     Family History  Problem Relation Age of Onset  . Heart attack Mother 72  .  Kidney failure Father 24  . Heart attack Brother      History   Social History  . Marital Status: Single    Spouse Name: N/A    Number of Children:  0  . Years of Education: N/A   Occupational History  . Retired     Marine scientist work; Water engineer   Social History Main Topics  . Smoking status: Former Smoker -- 30 years  . Smokeless tobacco: Never Used  . Alcohol Use: No  . Drug Use: No  . Sexually Active: Not on file   Other Topics Concern  . Not on file   Social History Narrative   Resides with a niece in [place]     BP 120/83  Pulse 91  Wt 190 lb (86.183 kg)  Physical Exam:  Well appearing NAD HEENT: Unremarkable Neck:  No JVD, no thyromegally Lungs:  Clear with no wheezes, rales, or rhonchi HEART:  Regular rate rhythm, no murmurs, no rubs, no clicks Abd:  soft, positive bowel sounds, no organomegally, no rebound, no guarding Ext:  2 plus pulses, no edema, no cyanosis, no clubbing Skin:  No rashes no nodules Neuro:  CN II through XII intact, motor grossly  intact  DEVICE  Normal device function.  See PaceArt for details.   Assess/Plan:

## 2011-08-30 NOTE — Assessment & Plan Note (Signed)
Her medtronic Biv ICD is working normally. Will recheck in several months.

## 2011-08-30 NOTE — Patient Instructions (Signed)
Your physician wants you to follow-up in: 12 months with Dr Taylor You will receive a reminder letter in the mail two months in advance. If you don't receive a letter, please call our office to schedule the follow-up appointment.  Remote monitoring is used to monitor your Pacemaker of ICD from home. This monitoring reduces the number of office visits required to check your device to one time per year. It allows us to keep an eye on the functioning of your device to ensure it is working properly. You are scheduled for a device check from home on 12/05/11. You may send your transmission at any time that day. If you have a wireless device, the transmission will be sent automatically. After your physician reviews your transmission, you will receive a postcard with your next transmission date.    

## 2011-09-19 ENCOUNTER — Ambulatory Visit (INDEPENDENT_AMBULATORY_CARE_PROVIDER_SITE_OTHER): Payer: Medicare Other | Admitting: *Deleted

## 2011-09-19 ENCOUNTER — Other Ambulatory Visit: Payer: Self-pay | Admitting: *Deleted

## 2011-09-19 DIAGNOSIS — I4891 Unspecified atrial fibrillation: Secondary | ICD-10-CM

## 2011-09-19 DIAGNOSIS — Z7901 Long term (current) use of anticoagulants: Secondary | ICD-10-CM

## 2011-09-19 MED ORDER — AMIODARONE HCL 200 MG PO TABS: 200.0000 mg | ORAL_TABLET | Freq: Every day | ORAL | Status: DC

## 2011-09-21 ENCOUNTER — Other Ambulatory Visit: Payer: Self-pay

## 2011-09-21 DIAGNOSIS — Z01810 Encounter for preprocedural cardiovascular examination: Secondary | ICD-10-CM

## 2011-09-21 DIAGNOSIS — N184 Chronic kidney disease, stage 4 (severe): Secondary | ICD-10-CM

## 2011-10-03 ENCOUNTER — Encounter: Payer: Self-pay | Admitting: Vascular Surgery

## 2011-10-04 ENCOUNTER — Ambulatory Visit (INDEPENDENT_AMBULATORY_CARE_PROVIDER_SITE_OTHER): Payer: Medicare Other | Admitting: Vascular Surgery

## 2011-10-04 ENCOUNTER — Encounter: Payer: Self-pay | Admitting: Vascular Surgery

## 2011-10-04 ENCOUNTER — Encounter (INDEPENDENT_AMBULATORY_CARE_PROVIDER_SITE_OTHER): Payer: Medicare Other | Admitting: *Deleted

## 2011-10-04 VITALS — BP 115/74 | HR 88 | Resp 16 | Ht 68.0 in | Wt 189.0 lb

## 2011-10-04 DIAGNOSIS — N186 End stage renal disease: Secondary | ICD-10-CM

## 2011-10-04 DIAGNOSIS — Z01810 Encounter for preprocedural cardiovascular examination: Secondary | ICD-10-CM

## 2011-10-04 DIAGNOSIS — N184 Chronic kidney disease, stage 4 (severe): Secondary | ICD-10-CM

## 2011-10-04 NOTE — Progress Notes (Signed)
Subjective:     Patient ID: [name] [name], female   DOB: 03/15/1940, 71 y.o.   MRN: [id number]  HPI this 71 year old female was referred by Dr. Elinor Parkinson for evaluation for vascular access. She is right-handed. She has never been on hemodialysis in the past. She is hesitant to proceed with a fistula at this time.   Past Medical History  Diagnosis Date  . Nonischemic cardiomyopathy 1998    EF of 25% in 1998, 15% in 2006; catheterization in 3/07-50% D1; luminal      irregularities in the other vessels; PA-65/25. 20% EF in 04/2007 with moderate MR and pulmonary htn; AICD/biventricular pacing--05/2005  . Hypertension     normal CBC, TSH in 03/2009  . Atrial fibrillation     plus nonsustained ventricular tachycardia --> amiodarone treatment  . Nonsustained ventricular tachycardia   . OSA (obstructive sleep apnea)   . Chronic kidney disease, stage 4, severely decreased GFR     Creatinine of 2.1 in 09/2008, 1.9 in 11/1998 and, 2.3 in 05/2010  . Venous stasis   . Gout     Uric acid of 12.5 in 03/2009  . Chronic anticoagulation   . History of lupus   . CHF (congestive heart failure)   . Chronic kidney disease     History  Substance Use Topics  . Smoking status: Current Everyday Smoker -- 0.5 packs/day for 30 years    Types: Cigarettes  . Smokeless tobacco: Never Used   Comment: pt states that she is trying to quit  . Alcohol Use: No    Family History  Problem Relation Age of Onset  . Heart attack Mother 44  . Kidney failure Father 38  . Heart attack Brother     Allergies  Allergen Reactions  . Penicillins Other (See Comments)    Stiffness    Current outpatient prescriptions:acetaminophen (TYLENOL) 500 MG tablet, Take 500 mg by mouth as needed. For pain, Disp: , Rfl: ;  ALDACTONE 25 MG tablet, TAKE ONE TABLET BY MOUTH ONCE DAILY., Disp: 30 each, Rfl: 0;  allopurinol (ZYLOPRIM) 100 MG tablet, Take 100 mg by mouth 2 (two) times daily.  , Disp: , Rfl:  amiodarone (PACERONE) 200 MG  tablet, Take 1 tablet (200 mg total) by mouth daily. Dose decrease /Take 1 tablet Monday through Friday and 1/2 tablet on Saturday and Sunday, Disp: 30 tablet, Rfl: 6;  aspirin EC 81 MG tablet, Take 81 mg by mouth daily.  , Disp: , Rfl: ;  buPROPion (WELLBUTRIN SR) 100 MG 12 hr tablet, Take 100 mg by mouth daily.  , Disp: , Rfl: ;  colchicine 0.6 MG tablet, Take 0.6 mg by mouth as needed., Disp: , Rfl:  COREG 25 MG tablet, TAKE 1 TABLET BY MOUTH TWICE A DAY., Disp: 60 each, Rfl: 6;  COUMADIN 5 MG tablet, TAKE 1-2 TABLETS BY MOUTH DAILY OR AS DIRECTED BY COUMADIN CLINIC., Disp: 45 each, Rfl: 3;  digoxin (LANOXIN) 0.125 MG tablet, Take 125 mcg by mouth daily., Disp: , Rfl: ;  enalapril (VASOTEC) 10 MG tablet, Take 10 mg by mouth daily. , Disp: , Rfl:  HYDROcodone-acetaminophen (VICODIN) 5-500 MG per tablet, Take 1 tablet by mouth daily as needed. For pain, Disp: , Rfl: ;  LASIX 40 MG tablet, TAKE 1 TABLET BY MOUTH   TWICE A DAY., Disp: 60 each, Rfl: 0;  spironolactone (ALDACTONE) 25 MG tablet, Take 25 mg by mouth daily., Disp: , Rfl: ;  Vitamin D, Ergocalciferol, (DRISDOL) 50000 UNITS CAPS,  Take 50,000 Units by mouth every 7 (seven) days., Disp: , Rfl:   BP 115/74  Pulse 88  Resp 16  Ht 5\' 8"  (1.727 m)  Wt 189 lb (85.73 kg)  BMI 28.74 kg/m2  SpO2 98%  Body mass index is 28.74 kg/(m^2).          Review of Systems denies chest pain, dyspnea on exertion, PND, orthopnea, claudication.    Objective:   Physical Exam blood pressure 115/74 heart rate 88 respirations 16 Gen.-alert and oriented x3 in no apparent distress HEENT normal for age Lungs no rhonchi or wheezing Cardiovascular regular rhythm no murmurs carotid pulses 3+ palpable no bruits audible Abdomen soft nontender no palpable masses Musculoskeletal free of  major deformities Skin clear -no rashes Neurologic normal Lower extremities 3+ femoral and dorsalis pedis pulses palpable bilaterally with no edema  Today I ordered bilateral  upper extremity vein mapping which are reviewed and interpreted. Her veins are quite small. Left arm has a cephalic vein which is very marginal in size. Right arm has a slightly larger cephalic vein in the upper arm but still borderline. Basilic veins are small bilaterally.      Assessment:     Chronic renal insufficiency not yet on hemodialysis needs AV fistula Patient is right-handed and best vein is right upper arm cephalic vein Patient also has pacemaker/defibrillator in left subclavian vein-should avoid left arm if possible    Plan:     Patient would like to meet with Dr. Robet Leu regarding fistula placement. Would recommend a right brachial-cephalic AV fistula. Patient should notify our office when ready to proceed with right upper arm AV fistula. Possible failure of fistula because of small size of conduit discussed with patient and she understands

## 2011-10-17 ENCOUNTER — Ambulatory Visit (INDEPENDENT_AMBULATORY_CARE_PROVIDER_SITE_OTHER): Payer: Medicare Other | Admitting: *Deleted

## 2011-10-17 DIAGNOSIS — Z7901 Long term (current) use of anticoagulants: Secondary | ICD-10-CM

## 2011-10-17 DIAGNOSIS — I4891 Unspecified atrial fibrillation: Secondary | ICD-10-CM

## 2011-10-31 ENCOUNTER — Ambulatory Visit (INDEPENDENT_AMBULATORY_CARE_PROVIDER_SITE_OTHER): Payer: Medicare Other | Admitting: *Deleted

## 2011-10-31 DIAGNOSIS — Z7901 Long term (current) use of anticoagulants: Secondary | ICD-10-CM

## 2011-10-31 DIAGNOSIS — I4891 Unspecified atrial fibrillation: Secondary | ICD-10-CM

## 2011-11-17 ENCOUNTER — Ambulatory Visit (INDEPENDENT_AMBULATORY_CARE_PROVIDER_SITE_OTHER): Payer: Medicare Other | Admitting: *Deleted

## 2011-11-17 DIAGNOSIS — Z7901 Long term (current) use of anticoagulants: Secondary | ICD-10-CM

## 2011-11-17 DIAGNOSIS — I4891 Unspecified atrial fibrillation: Secondary | ICD-10-CM

## 2011-11-17 LAB — POCT INR: INR: 2.6

## 2011-11-18 ENCOUNTER — Other Ambulatory Visit: Payer: Self-pay | Admitting: Internal Medicine

## 2011-12-05 ENCOUNTER — Encounter: Payer: Medicare Other | Admitting: *Deleted

## 2011-12-09 ENCOUNTER — Encounter: Payer: Self-pay | Admitting: *Deleted

## 2011-12-15 ENCOUNTER — Ambulatory Visit (INDEPENDENT_AMBULATORY_CARE_PROVIDER_SITE_OTHER): Payer: Medicare Other | Admitting: *Deleted

## 2011-12-15 DIAGNOSIS — Z7901 Long term (current) use of anticoagulants: Secondary | ICD-10-CM

## 2011-12-15 DIAGNOSIS — I4891 Unspecified atrial fibrillation: Secondary | ICD-10-CM

## 2012-01-12 ENCOUNTER — Encounter (INDEPENDENT_AMBULATORY_CARE_PROVIDER_SITE_OTHER): Payer: Medicare Other

## 2012-01-12 DIAGNOSIS — Z7901 Long term (current) use of anticoagulants: Secondary | ICD-10-CM

## 2012-01-12 DIAGNOSIS — I4891 Unspecified atrial fibrillation: Secondary | ICD-10-CM

## 2012-01-24 ENCOUNTER — Encounter: Payer: Self-pay | Admitting: *Deleted

## 2012-01-30 ENCOUNTER — Ambulatory Visit (INDEPENDENT_AMBULATORY_CARE_PROVIDER_SITE_OTHER): Payer: Medicare Other | Admitting: *Deleted

## 2012-01-30 DIAGNOSIS — Z7901 Long term (current) use of anticoagulants: Secondary | ICD-10-CM

## 2012-01-30 DIAGNOSIS — I4891 Unspecified atrial fibrillation: Secondary | ICD-10-CM

## 2012-01-30 LAB — POCT INR: INR: 1.7

## 2012-02-17 ENCOUNTER — Encounter: Payer: Self-pay | Admitting: *Deleted

## 2012-02-23 ENCOUNTER — Ambulatory Visit (INDEPENDENT_AMBULATORY_CARE_PROVIDER_SITE_OTHER): Payer: Medicare Other | Admitting: *Deleted

## 2012-02-23 DIAGNOSIS — Z7901 Long term (current) use of anticoagulants: Secondary | ICD-10-CM

## 2012-02-23 DIAGNOSIS — I4891 Unspecified atrial fibrillation: Secondary | ICD-10-CM

## 2012-03-05 ENCOUNTER — Other Ambulatory Visit: Payer: Self-pay | Admitting: *Deleted

## 2012-03-05 MED ORDER — CARVEDILOL 25 MG PO TABS: 25.0000 mg | ORAL_TABLET | Freq: Two times a day (BID) | ORAL | Status: DC

## 2012-03-10 ENCOUNTER — Other Ambulatory Visit: Payer: Self-pay | Admitting: Cardiology

## 2012-03-12 ENCOUNTER — Ambulatory Visit (INDEPENDENT_AMBULATORY_CARE_PROVIDER_SITE_OTHER): Payer: Medicare Other | Admitting: *Deleted

## 2012-03-12 DIAGNOSIS — Z7901 Long term (current) use of anticoagulants: Secondary | ICD-10-CM

## 2012-03-12 DIAGNOSIS — I4891 Unspecified atrial fibrillation: Secondary | ICD-10-CM

## 2012-03-14 ENCOUNTER — Telehealth: Payer: Self-pay | Admitting: Internal Medicine

## 2012-03-14 NOTE — Telephone Encounter (Signed)
Called pt to advise per protocol we can not provide a letter stating it bothers her to wear her seatbelt however we can provide a letter stating she has a device, attempted to call pt contact Carmelina Paddock and number noted as disconnected, also left message for pt at work. Placed letter at front desk for pick up

## 2012-03-14 NOTE — Telephone Encounter (Signed)
PT WAS PULLED OVER FOR NOT WEARING SEAT BELT SHE WOULD LIKE A LETTER STATING THAT SHE HAS A DEVICE AND IT BOTHERS HER TO WEAR A SEAT BELT.   SHE IS SCHEDULED FOR COURT 03/19/12.  SHE STATES IT IS HARD TO GET A HOLD OF HER BY PHONE SHE WILL CALL OR COME BY ON Friday 03/16/12 TO PICK IT UP.

## 2012-03-16 ENCOUNTER — Encounter: Payer: Self-pay | Admitting: *Deleted

## 2012-03-16 ENCOUNTER — Telehealth: Payer: Self-pay | Admitting: Internal Medicine

## 2012-03-16 NOTE — Telephone Encounter (Signed)
Patient brought in letter dated 02/17/12 certified and states she was not aware she needed device checked, as it is done remotely.  She states she did remote check November.  Made appointment on Monday at 1:00 pm.  Advised patient to call Monday morning to see if this appointment is ok and she does need to be seen.  Patient states the number provided is her niece cell phone, so it may be difficult to reach her.  She does not have a home number.  Patient also states she needs a note to not wear a seatbelt, due to her device hurts her when she wears the seatbelt.  Patient states she has received a ticket for not wearing seatbelt.

## 2012-03-16 NOTE — Telephone Encounter (Signed)
Letter placed at front desk for pt pick up, unable to reach pt by all numbers listed in system, unable to leave vm.

## 2012-03-16 NOTE — Telephone Encounter (Signed)
Spoke w/Yelena and ok for pt to keep appt on 03-19-12. Pt does not have home phone so that is probably why remotes are not going thru. Pt may eligible to be set up [facility] thru cellphone? That is one option or office checks only.

## 2012-03-19 ENCOUNTER — Encounter: Payer: Self-pay | Admitting: Internal Medicine

## 2012-03-19 ENCOUNTER — Other Ambulatory Visit: Payer: Self-pay | Admitting: Internal Medicine

## 2012-03-19 ENCOUNTER — Ambulatory Visit (INDEPENDENT_AMBULATORY_CARE_PROVIDER_SITE_OTHER): Payer: Medicare Other | Admitting: *Deleted

## 2012-03-19 DIAGNOSIS — I428 Other cardiomyopathies: Secondary | ICD-10-CM

## 2012-03-19 LAB — ICD DEVICE OBSERVATION
AL IMPEDENCE ICD: 399 Ohm
AL THRESHOLD: 1.25 V
BATTERY VOLTAGE: 3.0707 V
PACEART VT: 0
RV LEAD AMPLITUDE: 16.125 mv
RV LEAD THRESHOLD: 1 V
TOT-0002: 0
TOT-0006: 20120601000000
TZAT-0001ATACH: 2
TZAT-0001ATACH: 3
TZAT-0001FASTVT: 1
TZAT-0001SLOWVT: 1
TZAT-0001SLOWVT: 2
TZAT-0002ATACH: NEGATIVE
TZAT-0002FASTVT: NEGATIVE
TZAT-0004SLOWVT: 8
TZAT-0004SLOWVT: 8
TZAT-0012ATACH: 150 ms
TZAT-0012ATACH: 150 ms
TZAT-0012FASTVT: 200 ms
TZAT-0013SLOWVT: 3
TZAT-0013SLOWVT: 3
TZAT-0018ATACH: NEGATIVE
TZAT-0018ATACH: NEGATIVE
TZAT-0018FASTVT: NEGATIVE
TZAT-0019ATACH: 6 V
TZAT-0019FASTVT: 8 V
TZAT-0020ATACH: 1.5 ms
TZAT-0020SLOWVT: 1.5 ms
TZAT-0020SLOWVT: 1.5 ms
TZON-0003VSLOWVT: 400 ms
TZON-0004VSLOWVT: 36
TZST-0001ATACH: 6
TZST-0001FASTVT: 4
TZST-0001FASTVT: 5
TZST-0001FASTVT: 6
TZST-0001SLOWVT: 3
TZST-0001SLOWVT: 5
TZST-0002ATACH: NEGATIVE
TZST-0002FASTVT: NEGATIVE
TZST-0002FASTVT: NEGATIVE
TZST-0003SLOWVT: 35 J
VF: 0

## 2012-03-19 NOTE — Progress Notes (Signed)
ICD check with ICM 

## 2012-04-19 ENCOUNTER — Ambulatory Visit (INDEPENDENT_AMBULATORY_CARE_PROVIDER_SITE_OTHER): Payer: Medicare Other | Admitting: *Deleted

## 2012-04-19 DIAGNOSIS — Z7901 Long term (current) use of anticoagulants: Secondary | ICD-10-CM

## 2012-04-19 DIAGNOSIS — I4891 Unspecified atrial fibrillation: Secondary | ICD-10-CM

## 2012-04-19 LAB — POCT INR: INR: 2

## 2012-05-16 ENCOUNTER — Ambulatory Visit (INDEPENDENT_AMBULATORY_CARE_PROVIDER_SITE_OTHER): Payer: Medicare Other | Admitting: *Deleted

## 2012-05-16 DIAGNOSIS — Z7901 Long term (current) use of anticoagulants: Secondary | ICD-10-CM

## 2012-05-16 DIAGNOSIS — I4891 Unspecified atrial fibrillation: Secondary | ICD-10-CM

## 2012-05-16 LAB — POCT INR: INR: 1.8

## 2012-06-16 IMAGING — CT CT PELVIS W/O CM
2 of 3 series · 17 of 46 positions shown, 19 images · non-contrast
Comparison: None relevant.

CLINICAL DATA: Low back, bilateral hip and sacral pain.  No known
injury.

CT PELVIS WITHOUT CONTRAST
TECHNIQUE: Multidetector CT imaging of the pelvis was performed
following the standard protocol without intravenous contrast.

[Series 4: mpr pelvis cor bone 2.0 · coronal · 0.57mm/px · 3 of 135 slices shown]
[im 45/135  soft-tissue]
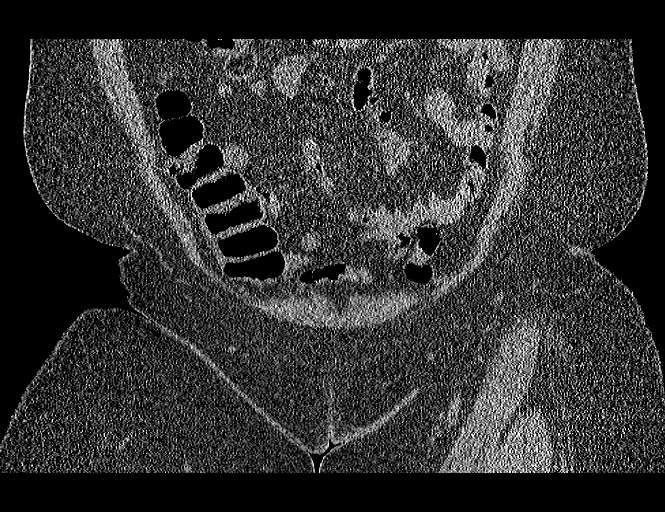
[im 60/135  soft-tissue]
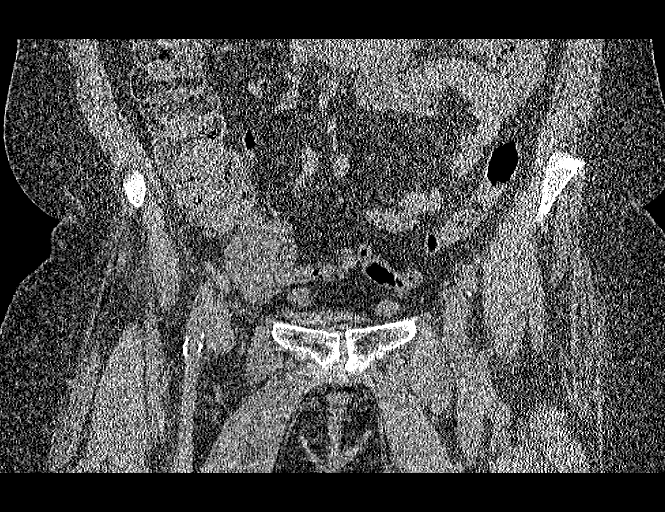
[im 75/135  soft-tissue]
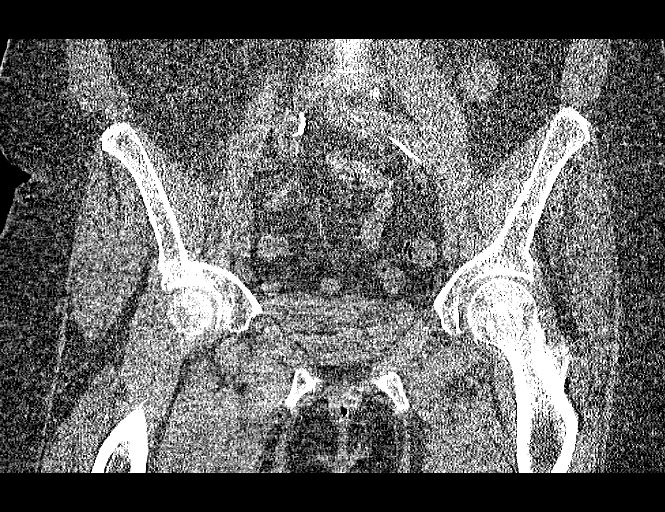

[Series 6: pelvis axial st 2.0 · axial · 0.71mm/px · z∈[-509,-293]mm · 14 of 124 slices shown, 16 images]
[im 8/124  soft-tissue]
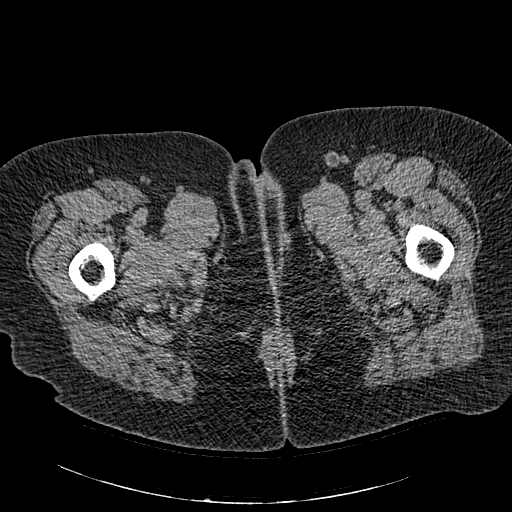
[im 8/124  bone]
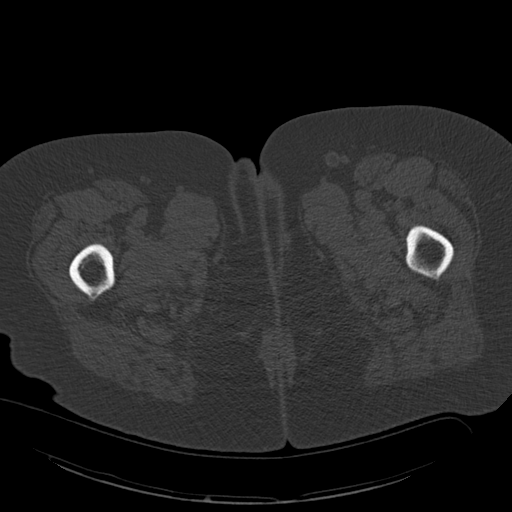
[im 16/124  soft-tissue]
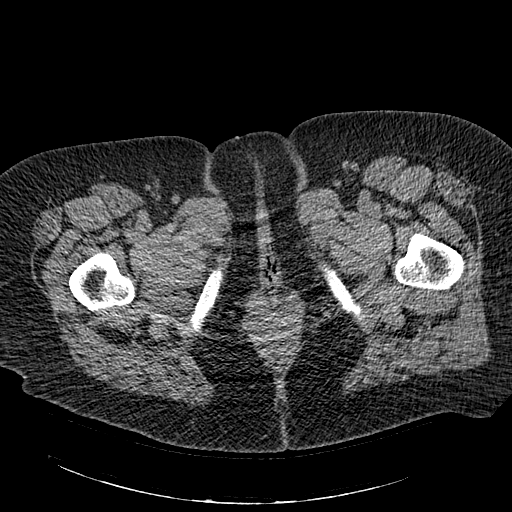
[im 24/124  soft-tissue]
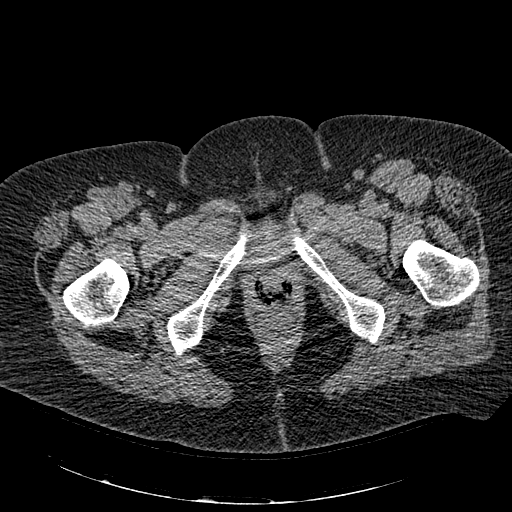
[im 32/124  soft-tissue]
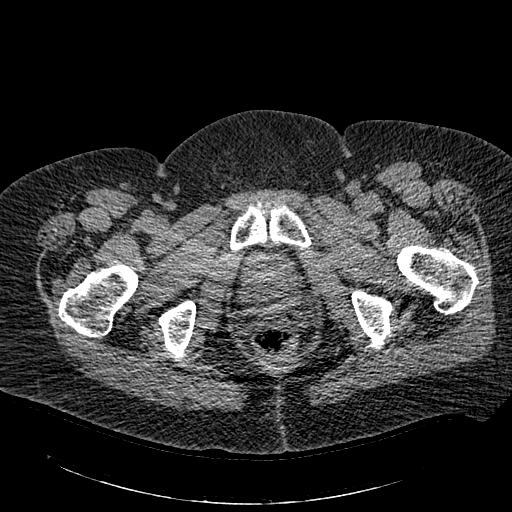
[im 40/124  soft-tissue]
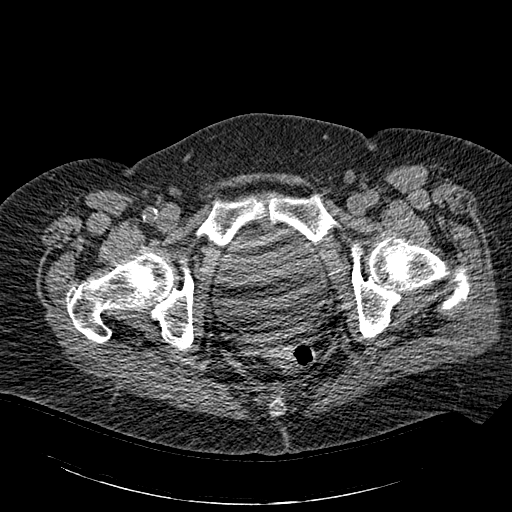
[im 48/124  soft-tissue]
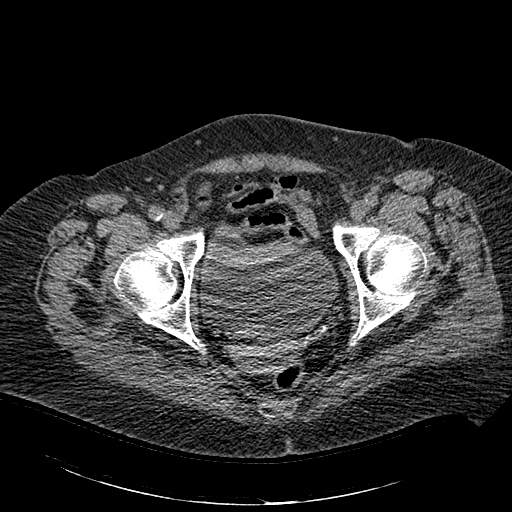
[im 56/124  soft-tissue]
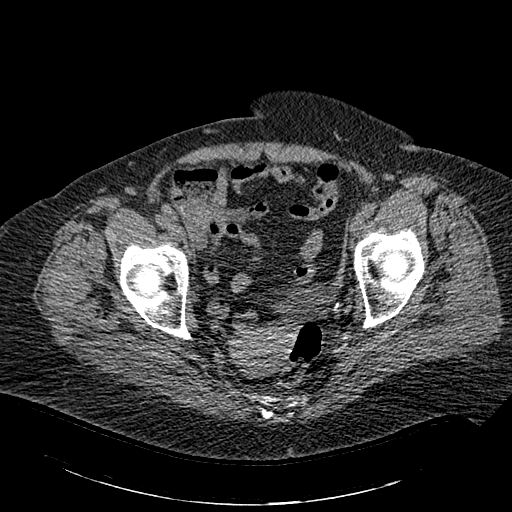
[im 68/124  soft-tissue]
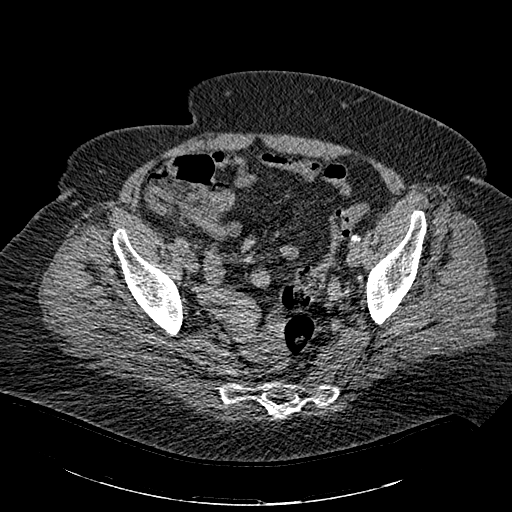
[im 76/124  soft-tissue]
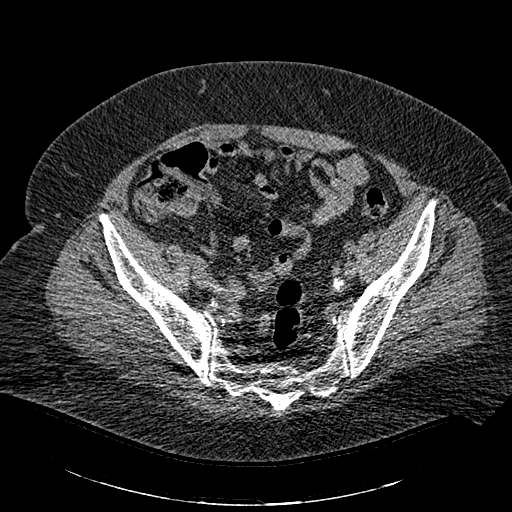
[im 76/124  bone]
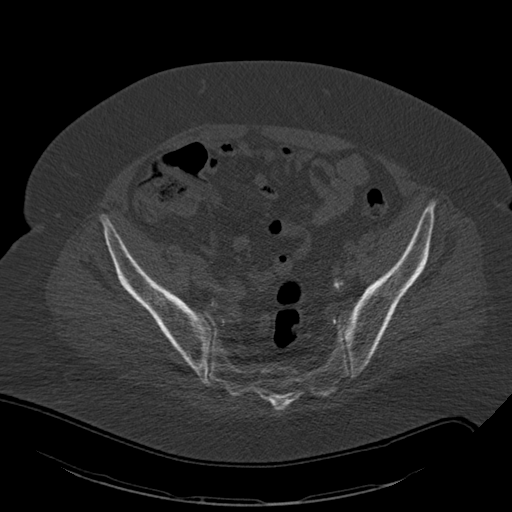
[im 84/124  soft-tissue]
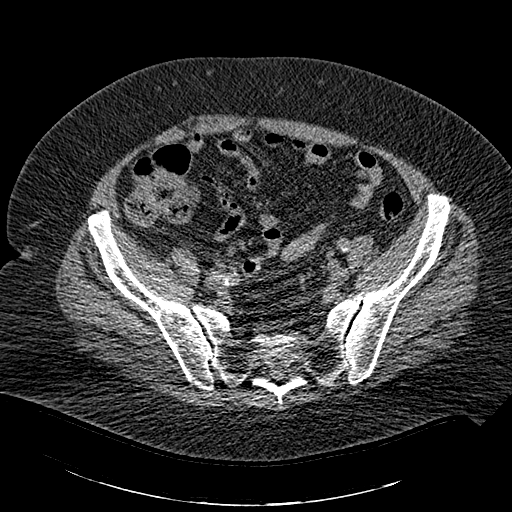
[im 92/124  soft-tissue]
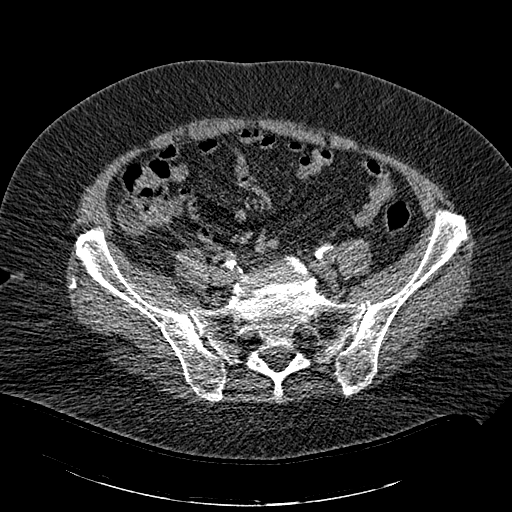
[im 100/124  soft-tissue]
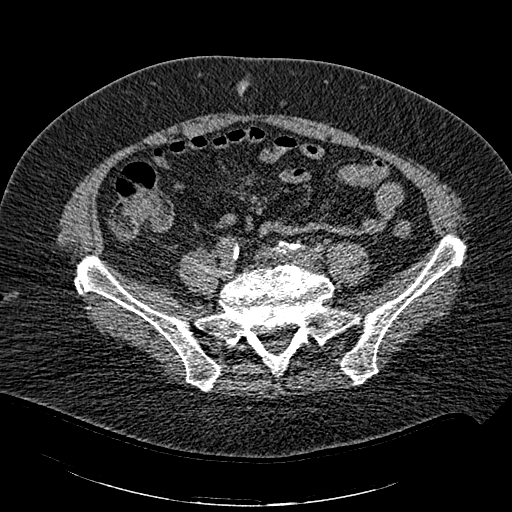
[im 108/124  soft-tissue]
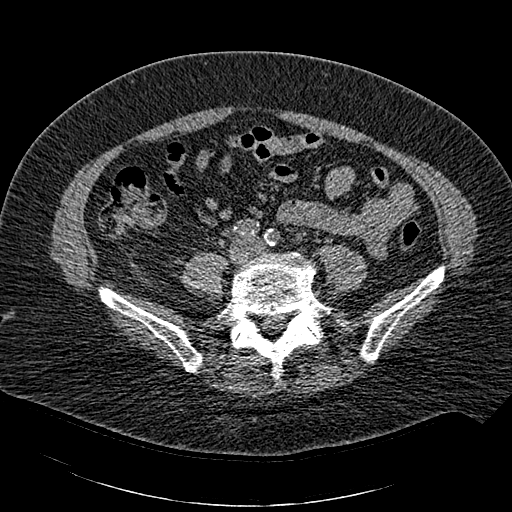
[im 116/124  soft-tissue]
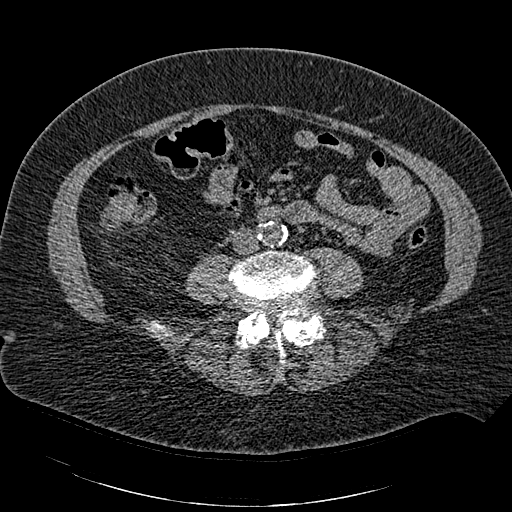

[17 of 46 positions shown; findings below may reference images not displayed]

FINDINGS: Bone detail is limited by body habitus and technique.
There is mild osteopenia.  Lower lumbar spine degenerative changes
are dictated separately.  There are mild degenerative changes at
the sacroiliac joints and symphysis pubis.  No significant hip
arthropathic changes are demonstrated.

There is no evidence of acute fracture, dislocation or femoral head
osteonecrosis.  Scattered vascular calcifications are noted. No
inflammatory changes are evident.
IMPRESSION: 1.  No acute pelvic findings identified.
2.  Mild degenerative changes at the symphysis pubis and sacroiliac
joints.
3.  More advanced lower lumbar spine degenerative changes are
dictated separately.

## 2012-06-19 ENCOUNTER — Encounter: Payer: Medicare Other | Admitting: *Deleted

## 2012-06-25 ENCOUNTER — Encounter: Payer: Self-pay | Admitting: *Deleted

## 2012-06-28 ENCOUNTER — Ambulatory Visit (INDEPENDENT_AMBULATORY_CARE_PROVIDER_SITE_OTHER): Payer: Medicare Other | Admitting: *Deleted

## 2012-06-28 DIAGNOSIS — I4891 Unspecified atrial fibrillation: Secondary | ICD-10-CM

## 2012-06-28 DIAGNOSIS — Z7901 Long term (current) use of anticoagulants: Secondary | ICD-10-CM

## 2012-06-28 LAB — POCT INR: INR: 1.2

## 2012-07-05 ENCOUNTER — Ambulatory Visit (INDEPENDENT_AMBULATORY_CARE_PROVIDER_SITE_OTHER): Payer: Medicare Other | Admitting: *Deleted

## 2012-07-05 DIAGNOSIS — Z7901 Long term (current) use of anticoagulants: Secondary | ICD-10-CM

## 2012-07-05 DIAGNOSIS — I4891 Unspecified atrial fibrillation: Secondary | ICD-10-CM

## 2012-07-05 LAB — POCT INR: INR: 2.7

## 2012-07-09 ENCOUNTER — Telehealth: Payer: Self-pay | Admitting: *Deleted

## 2012-07-09 MED ORDER — DIGOXIN 125 MCG PO TABS: 125.0000 ug | ORAL_TABLET | ORAL | Status: DC

## 2012-07-09 NOTE — Telephone Encounter (Signed)
Medication sent via escribe.  

## 2012-07-30 ENCOUNTER — Ambulatory Visit (INDEPENDENT_AMBULATORY_CARE_PROVIDER_SITE_OTHER): Payer: Medicare Other | Admitting: *Deleted

## 2012-07-30 DIAGNOSIS — I4891 Unspecified atrial fibrillation: Secondary | ICD-10-CM

## 2012-07-30 DIAGNOSIS — Z7901 Long term (current) use of anticoagulants: Secondary | ICD-10-CM

## 2012-07-30 LAB — POCT INR: INR: 2.6

## 2012-08-06 ENCOUNTER — Other Ambulatory Visit: Payer: Self-pay | Admitting: *Deleted

## 2012-08-06 MED ORDER — WARFARIN SODIUM 5 MG PO TABS: ORAL_TABLET | ORAL | Status: DC

## 2012-08-11 ENCOUNTER — Other Ambulatory Visit: Payer: Self-pay | Admitting: Cardiology

## 2012-08-13 NOTE — Telephone Encounter (Signed)
Medication sent via escribe.  

## 2012-08-29 ENCOUNTER — Ambulatory Visit (INDEPENDENT_AMBULATORY_CARE_PROVIDER_SITE_OTHER): Payer: Medicare Other | Admitting: *Deleted

## 2012-08-29 DIAGNOSIS — Z7901 Long term (current) use of anticoagulants: Secondary | ICD-10-CM

## 2012-08-29 DIAGNOSIS — I4891 Unspecified atrial fibrillation: Secondary | ICD-10-CM

## 2012-08-29 LAB — POCT INR: INR: 2.8

## 2012-08-30 ENCOUNTER — Encounter: Payer: Self-pay | Admitting: Internal Medicine

## 2012-08-30 ENCOUNTER — Ambulatory Visit (INDEPENDENT_AMBULATORY_CARE_PROVIDER_SITE_OTHER): Payer: Medicare Other | Admitting: Internal Medicine

## 2012-08-30 VITALS — BP 108/52 | HR 76 | Ht 68.0 in | Wt 197.1 lb

## 2012-08-30 DIAGNOSIS — I4891 Unspecified atrial fibrillation: Secondary | ICD-10-CM

## 2012-08-30 DIAGNOSIS — Z95810 Presence of automatic (implantable) cardiac defibrillator: Secondary | ICD-10-CM

## 2012-08-30 DIAGNOSIS — I428 Other cardiomyopathies: Secondary | ICD-10-CM

## 2012-08-30 LAB — ICD DEVICE OBSERVATION
AL AMPLITUDE: 2 mv
ATRIAL PACING ICD: 95.63 pct
CHARGE TIME: 9.579 s
LV LEAD IMPEDENCE ICD: 399 Ohm
LV LEAD THRESHOLD: 1.25 V
RV LEAD AMPLITUDE: 10.4 mv
RV LEAD IMPEDENCE ICD: 494 Ohm
TOT-0001: 1
TOT-0002: 0
TOT-0006: 20120601000000
TZAT-0001FASTVT: 1
TZAT-0002ATACH: NEGATIVE
TZAT-0002FASTVT: NEGATIVE
TZAT-0011SLOWVT: 10 ms
TZAT-0011SLOWVT: 10 ms
TZAT-0012SLOWVT: 200 ms
TZAT-0012SLOWVT: 200 ms
TZAT-0018ATACH: NEGATIVE
TZAT-0018SLOWVT: NEGATIVE
TZAT-0018SLOWVT: NEGATIVE
TZAT-0019ATACH: 6 V
TZAT-0019ATACH: 6 V
TZAT-0019SLOWVT: 8 V
TZAT-0019SLOWVT: 8 V
TZAT-0020ATACH: 1.5 ms
TZAT-0020ATACH: 1.5 ms
TZAT-0020FASTVT: 1.5 ms
TZON-0005SLOWVT: 16
TZST-0001ATACH: 5
TZST-0001FASTVT: 3
TZST-0001FASTVT: 4
TZST-0001FASTVT: 5
TZST-0001SLOWVT: 4
TZST-0002ATACH: NEGATIVE
TZST-0002FASTVT: NEGATIVE
TZST-0003SLOWVT: 35 J

## 2012-08-30 MED ORDER — SPIRONOLACTONE 25 MG PO TABS: 25.0000 mg | ORAL_TABLET | Freq: Every day | ORAL | Status: DC

## 2012-08-30 MED ORDER — WARFARIN SODIUM 5 MG PO TABS: ORAL_TABLET | ORAL | Status: DC

## 2012-08-30 MED ORDER — BUPROPION HCL ER (SR) 100 MG PO TB12: 100.0000 mg | ORAL_TABLET | Freq: Every day | ORAL | Status: DC

## 2012-08-30 MED ORDER — DIGOXIN 125 MCG PO TABS: 125.0000 ug | ORAL_TABLET | ORAL | Status: DC

## 2012-08-30 MED ORDER — CARVEDILOL 25 MG PO TABS: 25.0000 mg | ORAL_TABLET | Freq: Two times a day (BID) | ORAL | Status: DC

## 2012-08-30 MED ORDER — AMIODARONE HCL 200 MG PO TABS: 200.0000 mg | ORAL_TABLET | ORAL | Status: DC

## 2012-08-30 NOTE — Progress Notes (Signed)
HPI [name] [name] returns today for followup. She is a very pleasant 72 yo woman with a non-ischemic cardiomyopathy, chronic systolic heart failure, left bundle branch block, status post biventricular ICD implantation. She also is a history of ventricular tachycardia and paroxysmal atrial fibrillation. Both arrhythmias have been well controlled on low-dose amiodarone. She denies syncope. She admits to being fairly sedentary she is going to try and increase her physical activity. Allergies  Allergen Reactions  . Penicillins Other (See Comments)    Stiffness     Current Outpatient Prescriptions  Medication Sig Dispense Refill  . acetaminophen (TYLENOL) 500 MG tablet Take 500 mg by mouth as needed. For pain      . allopurinol (ZYLOPRIM) 100 MG tablet Take 100 mg by mouth 2 (two) times daily.        Marland Kitchen amiodarone (PACERONE) 200 MG tablet Take 200 mg by mouth daily. 1 tablet Monday through Friday and 1/2 tablet sat and sun      . buPROPion (WELLBUTRIN SR) 100 MG 12 hr tablet Take 100 mg by mouth daily.        . carvedilol (COREG) 25 MG tablet TAKE 1 TABLET BY MOUTH TWICE A DAY.  60 tablet  3  . Chlorpheniramine Maleate (ALLERGY RELIEF PO) Take by mouth.      . colchicine 0.6 MG tablet Take 0.6 mg by mouth as needed.      . digoxin (LANOXIN) 0.125 MG tablet Take 1 tablet (125 mcg total) by mouth every other day.  30 tablet  2  . enalapril (VASOTEC) 10 MG tablet Take 10 mg by mouth daily.       Marland Kitchen HYDROcodone-acetaminophen (VICODIN) 5-500 MG per tablet Take 1 tablet by mouth daily as needed. For pain      . LASIX 40 MG tablet TAKE 1 TABLET BY MOUTH   TWICE A DAY.  60 each  0  . spironolactone (ALDACTONE) 25 MG tablet Take 25 mg by mouth daily.      . Vitamin D, Ergocalciferol, (DRISDOL) 50000 UNITS CAPS Take 50,000 Units by mouth every 7 (seven) days.      Marland Kitchen warfarin (COUMADIN) 5 MG tablet TAKE 1-2 TABLETS BY MOUTH DAILY OR AS DIRECTED BY COUMADIN CLINIC.  45 tablet  2   No current  facility-administered medications for this visit.     Past Medical History  Diagnosis Date  . Nonischemic cardiomyopathy 1998    EF of 25% in 1998, 15% in 2006; catheterization in 3/07-50% D1; luminal      irregularities in the other vessels; PA-65/25. 20% EF in 04/2007 with moderate MR and pulmonary htn; AICD/biventricular pacing--05/2005  . Hypertension     normal CBC, TSH in 03/2009  . Atrial fibrillation     plus nonsustained ventricular tachycardia --> amiodarone treatment  . Nonsustained ventricular tachycardia   . OSA (obstructive sleep apnea)   . Chronic kidney disease, stage 4, severely decreased GFR     Creatinine of 2.1 in 09/2008, 1.9 in 11/1998 and, 2.3 in 05/2010  . Venous stasis   . Gout     Uric acid of 12.5 in 03/2009  . Chronic anticoagulation   . History of lupus   . CHF (congestive heart failure)   . Chronic kidney disease     ROS:   All systems reviewed and negative except as noted in the HPI.   Past Surgical History  Procedure Laterality Date  . Appendectomy  1987  . Incision and drainage intra oral abscess  1991    submandibular abscess  . Cardiac defibrillator placement  05/2005    Medtronic Insync, Biventricular     Family History  Problem Relation Age of Onset  . Heart attack Mother 35  . Kidney failure Father 30  . Heart attack Brother      History   Social History  . Marital Status: Single    Spouse Name: N/A    Number of Children:  0  . Years of Education: N/A   Occupational History  . Retired     Marine scientist work; Water engineer   Social History Main Topics  . Smoking status: Current Every Day Smoker -- 0.50 packs/day for 30 years    Types: Cigarettes  . Smokeless tobacco: Never Used     Comment: pt states that she is trying to quit  . Alcohol Use: No  . Drug Use: No  . Sexually Active: Not on file   Other Topics Concern  . Not on file   Social History Narrative   Resides with a niece in [place]     BP 108/52  Pulse 76   Ht 5\' 8"  (1.727 m)  Wt 197 lb 1.9 oz (89.413 kg)  BMI 29.98 kg/m2  Physical Exam:  Well appearing 72 year old woman, NAD HEENT: Unremarkable Neck:  7 cm JVD, no thyromegally Back:  No CVA tenderness Lungs:  Clear with no wheezes, rales, or rhonchi. HEART:  Regular rate rhythm, no murmurs, no rubs, no clicks Abd:  soft, positive bowel sounds, no organomegally, no rebound, no guarding Ext:  2 plus pulses, no edema, no cyanosis, no clubbing Skin:  No rashes no nodules Neuro:  CN II through XII intact, motor grossly intact   DEVICE  Normal device function.  See PaceArt for details.   Assess/Plan:

## 2012-08-30 NOTE — Patient Instructions (Addendum)

## 2012-08-30 NOTE — Assessment & Plan Note (Signed)
Her device is working normally. We'll plan to recheck in several months. 

## 2012-08-30 NOTE — Assessment & Plan Note (Signed)
She is maintaining sinus rhythm. She'll continue her current medical therapy.

## 2012-10-03 ENCOUNTER — Encounter: Payer: Self-pay | Admitting: *Deleted

## 2012-10-11 ENCOUNTER — Ambulatory Visit (INDEPENDENT_AMBULATORY_CARE_PROVIDER_SITE_OTHER): Payer: Medicare Other | Admitting: *Deleted

## 2012-10-11 DIAGNOSIS — I4891 Unspecified atrial fibrillation: Secondary | ICD-10-CM

## 2012-10-11 DIAGNOSIS — Z7901 Long term (current) use of anticoagulants: Secondary | ICD-10-CM

## 2012-10-11 LAB — POCT INR: INR: 2.1

## 2012-11-22 ENCOUNTER — Ambulatory Visit (INDEPENDENT_AMBULATORY_CARE_PROVIDER_SITE_OTHER): Payer: Medicare Other | Admitting: *Deleted

## 2012-11-22 DIAGNOSIS — I4891 Unspecified atrial fibrillation: Secondary | ICD-10-CM

## 2012-11-22 DIAGNOSIS — Z7901 Long term (current) use of anticoagulants: Secondary | ICD-10-CM

## 2012-11-29 ENCOUNTER — Other Ambulatory Visit: Payer: Self-pay

## 2012-12-03 ENCOUNTER — Encounter: Payer: Medicare Other | Admitting: *Deleted

## 2012-12-14 ENCOUNTER — Encounter: Payer: Self-pay | Admitting: *Deleted

## 2012-12-28 ENCOUNTER — Ambulatory Visit (INDEPENDENT_AMBULATORY_CARE_PROVIDER_SITE_OTHER): Payer: Medicare Other | Admitting: *Deleted

## 2012-12-28 DIAGNOSIS — I428 Other cardiomyopathies: Secondary | ICD-10-CM

## 2012-12-28 LAB — MDC_IDC_ENUM_SESS_TYPE_INCLINIC
Brady Statistic AP VP Percent: 96.31 %
Brady Statistic RA Percent Paced: 97.56 %
Brady Statistic RV Percent Paced: 97.22 %
Date Time Interrogation Session: 20141205151326
HighPow Impedance: 266 Ohm
HighPow Impedance: 380 Ohm
HighPow Impedance: 50 Ohm
HighPow Impedance: 66 Ohm
Lead Channel Impedance Value: 342 Ohm
Lead Channel Impedance Value: 399 Ohm
Lead Channel Impedance Value: 513 Ohm
Lead Channel Pacing Threshold Amplitude: 1 V
Lead Channel Pacing Threshold Amplitude: 1 V
Lead Channel Pacing Threshold Amplitude: 1.25 V
Lead Channel Pacing Threshold Pulse Width: 0.4 ms
Lead Channel Pacing Threshold Pulse Width: 0.4 ms
Lead Channel Sensing Intrinsic Amplitude: 12.625 mV
Lead Channel Sensing Intrinsic Amplitude: 14.75 mV
Lead Channel Sensing Intrinsic Amplitude: 2.375 mV
Lead Channel Setting Pacing Amplitude: 2.5 V
Lead Channel Setting Sensing Sensitivity: 0.3 mV
Zone Setting Detection Interval: 290 ms
Zone Setting Detection Interval: 350 ms
Zone Setting Detection Interval: 350 ms
Zone Setting Detection Interval: 400 ms

## 2012-12-28 NOTE — Progress Notes (Signed)
ICD check in office with ICM. 

## 2013-01-07 ENCOUNTER — Ambulatory Visit (INDEPENDENT_AMBULATORY_CARE_PROVIDER_SITE_OTHER): Payer: Medicare Other | Admitting: *Deleted

## 2013-01-07 DIAGNOSIS — I4891 Unspecified atrial fibrillation: Secondary | ICD-10-CM

## 2013-01-07 DIAGNOSIS — Z7901 Long term (current) use of anticoagulants: Secondary | ICD-10-CM

## 2013-01-16 ENCOUNTER — Encounter: Payer: Self-pay | Admitting: Internal Medicine

## 2013-01-25 ENCOUNTER — Telehealth: Payer: Self-pay

## 2013-01-25 MED ORDER — WARFARIN SODIUM 5 MG PO TABS: ORAL_TABLET | ORAL | Status: DC

## 2013-01-25 NOTE — Telephone Encounter (Signed)
Medication sent via escribe.  

## 2013-01-25 NOTE — Telephone Encounter (Signed)
Received fax refill request  Rx # [id number] Medication:  Warfarin Sodium 5mg  Qty 45  Sig:  Take 1-2 tablets by mouth daily or as directed by coumadin clinic Physician:  Lovena Le

## 2013-01-28 ENCOUNTER — Encounter: Payer: Self-pay | Admitting: Gastroenterology

## 2013-02-21 ENCOUNTER — Ambulatory Visit (INDEPENDENT_AMBULATORY_CARE_PROVIDER_SITE_OTHER): Payer: Medicare Other | Admitting: Gastroenterology

## 2013-02-21 ENCOUNTER — Other Ambulatory Visit: Payer: Self-pay | Admitting: Internal Medicine

## 2013-02-21 ENCOUNTER — Encounter: Payer: Self-pay | Admitting: Gastroenterology

## 2013-02-21 ENCOUNTER — Telehealth: Payer: Self-pay

## 2013-02-21 VITALS — BP 122/78 | HR 90 | Temp 97.6°F | Wt 200.4 lb

## 2013-02-21 DIAGNOSIS — D649 Anemia, unspecified: Secondary | ICD-10-CM

## 2013-02-21 DIAGNOSIS — D509 Iron deficiency anemia, unspecified: Secondary | ICD-10-CM

## 2013-02-21 DIAGNOSIS — R195 Other fecal abnormalities: Secondary | ICD-10-CM

## 2013-02-21 MED ORDER — PEG 3350-KCL-NA BICARB-NACL 420 G PO SOLR: 4000.0000 mL | ORAL | Status: DC

## 2013-02-21 NOTE — Progress Notes (Signed)
Primary Care Physician:  Delphina Cahill, MD Primary Gastroenterologist:  Dr. Gala Romney  Chief Complaint  Patient presents with  . Rectal Bleeding    HPI:   [name] [name] presents today at the request of both Dr. Nevada Crane and Dr. Lowanda Foster secondary to heme positive stool. She is on chronic Coumadin for chronic afib. 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 Appears Aug 2014 ferritin was 11, Iron 22. Ferritin improved to 22 and iron 69 in Oct 2014. Hgb 10.8 in October 2014. Most recent labs not available at time of visit.   No abdominal pain. States she feels like bowels are more sluggish. No hematochezia or melena. States appetite is not like it "used to be". Decreased portion sizes. Feels like bowel movements are slower coming but denies constipation. Walks a lot. Weight fluctuates but no abnormal weight loss or unintentional weight loss. No reflux, dysphagia. No fatigue, weakness.   No prior colonoscopy or upper endoscopy.   Past Medical History  Diagnosis Date  . Nonischemic cardiomyopathy 1998    EF of 25% in 1998, 15% in 2006; catheterization in 3/07-50% D1; luminal      irregularities in the other vessels; PA-65/25. 20% EF in 04/2007 with moderate MR and pulmonary htn; AICD/biventricular pacing--05/2005  . Hypertension   . Atrial fibrillation     plus nonsustained ventricular tachycardia --> amiodarone treatment  . Nonsustained ventricular tachycardia   . OSA (obstructive sleep apnea)     no sleep machine  . Chronic kidney disease, stage 4, severely decreased GFR     Creatinine of 2.1 in 09/2008, 1.9 in 11/1998 and, 2.3 in 05/2010  . Venous stasis   . Gout     Uric acid of 12.5 in 03/2009  . Chronic anticoagulation   . History of lupus   . CHF (congestive heart failure)   . Chronic kidney disease     Past Surgical History  Procedure Laterality Date  .  Appendectomy  1987  . Incision and drainage intra oral abscess  1991    submandibular abscess  . Cardiac defibrillator placement  05/2005    Medtronic Insync, Biventricular    Current Outpatient Prescriptions  Medication Sig Dispense Refill  . acetaminophen (TYLENOL) 500 MG tablet Take 500 mg by mouth as needed. For pain      . allopurinol (ZYLOPRIM) 100 MG tablet Take 100 mg by mouth daily.       Marland Kitchen amiodarone (PACERONE) 200 MG tablet Take 200 mg by mouth as directed. 1/2 tablet Monday through Friday and 1 tablet sat and sun      . buPROPion (WELLBUTRIN SR) 100 MG 12 hr tablet Take 1 tablet (100 mg total) by mouth daily.  90 tablet  0  . carvedilol (COREG) 25 MG tablet Take 1 tablet (25 mg total) by mouth 2 (two) times daily with a meal.  180 tablet  3  . Chlorpheniramine Maleate (ALLERGY RELIEF PO) Take by mouth.      . colchicine 0.6 MG tablet Take 0.6 mg by mouth as needed.      . digoxin (LANOXIN) 0.125 MG tablet Take 1 tablet (125 mcg total) by mouth every other day.  30 tablet  6  . enalapril (VASOTEC) 10 MG tablet Take 10 mg by mouth daily.       Marland Kitchen LASIX 40 MG tablet TAKE  1 TABLET BY MOUTH   TWICE A DAY.  60 each  0  . spironolactone (ALDACTONE) 25 MG tablet Take 1 tablet (25 mg total) by mouth daily.  90 tablet  3  . Vitamin D, Ergocalciferol, (DRISDOL) 50000 UNITS CAPS Take 50,000 Units by mouth every 7 (seven) days.      Marland Kitchen warfarin (COUMADIN) 5 MG tablet TAKE 1-2 TABLETS BY MOUTH DAILY OR AS DIRECTED BY COUMADIN CLINIC.  45 tablet  2   No current facility-administered medications for this visit.    Allergies as of 02/21/2013 - Review Complete 02/21/2013  Allergen Reaction Noted  . Penicillins Other (See Comments)     Family History  Problem Relation Age of Onset  . Heart attack Mother 38  . Kidney failure Father 52  . Heart attack Brother   . Colon cancer Neg Hx     History   Social History  . Marital Status: Single    Spouse Name: N/A    Number of Children:  0    . Years of Education: N/A   Occupational History  . Retired     Environmental manager work; Programmer, applications   Social History Main Topics  . Smoking status: Current Every Day Smoker -- 0.50 packs/day for 30 years    Types: Cigarettes  . Smokeless tobacco: Never Used     Comment: pt states that she is trying to quit  . Alcohol Use: No  . Drug Use: No  . Sexual Activity: Not on file   Other Topics Concern  . Not on file   Social History Narrative   Resides with a niece in [place] of Systems: Gen: Denies any fever, chills, fatigue, weight loss, lack of appetite.  CV: Denies chest pain, heart palpitations, peripheral edema, syncope.  Resp: Denies shortness of breath at rest or with exertion. Denies wheezing or cough.  GI: see HPI GU : Denies urinary burning, urinary frequency, urinary hesitancy MS: Denies joint pain, muscle weakness, cramps, or limitation of movement.  Derm: Denies rash, itching, dry skin Psych: Denies depression, anxiety, memory loss, and confusion Heme: Denies bruising, bleeding, and enlarged lymph nodes.  Physical Exam: BP 122/78  Pulse 90  Temp(Src) 97.6 F (36.4 C) (Oral)  Wt 200 lb 6.4 oz (90.901 kg) General:   Alert and oriented. Pleasant and cooperative. Well-nourished and well-developed.  Head:  Normocephalic and atraumatic. Eyes:  Without icterus, sclera clear and conjunctiva pink.  Ears:  Normal auditory acuity. Nose:  No deformity, discharge,  or lesions. Mouth:  No deformity or lesions, oral mucosa pink.  Neck:  Supple, without mass or thyromegaly. Lungs:  Clear to auscultation bilaterally. No wheezes, rales, or rhonchi. No distress.  Heart:  S1, S2 present without murmurs appreciated.  Abdomen:  +BS, soft, non-tender and non-distended. No HSM noted. No guarding or rebound. No masses appreciated.  Rectal:  Deferred  Msk:  Symmetrical without gross deformities. Normal posture. Extremities:  Without clubbing or edema. Neurologic:  Alert and   oriented x4;  grossly normal neurologically. Skin:  Intact without significant lesions or rashes. Cervical Nodes:  No significant cervical adenopathy. Psych:  Alert and cooperative. Normal mood and affect.

## 2013-02-21 NOTE — Assessment & Plan Note (Signed)
Obtain most recent outside labs. GI evaluation as planned.

## 2013-02-21 NOTE — Telephone Encounter (Signed)
[name] [name], this pt is scheduled for a colonoscopy on 03/13/13 with Dr.Rourk. We need to know if its ok to hold her coumadin for 4 days or does she need to do a lovenox bridge?   Thank you, Almyra Free

## 2013-02-21 NOTE — Assessment & Plan Note (Signed)
73 year old female with heme positive stool and IDA in the setting of chronic Coumadin for afib, without any signs of overt GI bleeding. Vague reports of "sluggish" bowel habits and decreased appetite but otherwise asymptomatic. Appears she received IV iron in October last year. Most recent bloodwork unavailable at time of visit. With setting of anticoagulation, GI blood loss could come from any portion of GI tract. She has had no prior colonoscopy or EGD; unable to exclude occult malignancy. She does have a pacemaker/defibrillator, so capsule endoscopy may not be feasible in this patient. Would need to weigh risks and benefits if capsule study needed, possibly complete while monitored in short stay due to presence of defibrillator.   Proceed with TCS and upper endoscopy with Dr. Gala Romney in near future: the risks, benefits, and alternatives have been discussed with the patient in detail. The patient states understanding and desires to proceed. Obtain most recent labs from Dr. Lowanda Foster Will need to hold Coumadin X 4 days prior; will discuss with cardiology the need for Lovenox bridging.

## 2013-02-21 NOTE — Telephone Encounter (Signed)
Please advise 

## 2013-02-21 NOTE — Progress Notes (Signed)
cc'd to pcp 

## 2013-02-21 NOTE — Patient Instructions (Signed)
We have scheduled you for a colonoscopy and upper endoscopy with Dr. Gala Romney in the near future.  We will need you to stop your Coumadin for 4 days before the procedure. We will check with cardiology to see if you need to have blood thinner shots during those days you are not on Coumadin. We will let you know for sure!

## 2013-02-27 ENCOUNTER — Ambulatory Visit (INDEPENDENT_AMBULATORY_CARE_PROVIDER_SITE_OTHER): Payer: Medicare Other | Admitting: *Deleted

## 2013-02-27 DIAGNOSIS — I4891 Unspecified atrial fibrillation: Secondary | ICD-10-CM

## 2013-02-27 DIAGNOSIS — Z7901 Long term (current) use of anticoagulants: Secondary | ICD-10-CM

## 2013-02-27 DIAGNOSIS — Z5181 Encounter for therapeutic drug level monitoring: Secondary | ICD-10-CM

## 2013-02-27 LAB — POCT INR: INR: 2.8

## 2013-02-28 ENCOUNTER — Telehealth: Payer: Self-pay | Admitting: *Deleted

## 2013-02-28 NOTE — Telephone Encounter (Signed)
Thank you [name] [name]!  I tried to call pt- LMOM asking pt to return call.

## 2013-02-28 NOTE — Telephone Encounter (Signed)
Per Jory Sims NP:  OK for pt to hold coumadin 4 days prior to procedure.  Restart night of procedure if OK with Dr Gala Romney.

## 2013-03-01 NOTE — Telephone Encounter (Signed)
Patient is aware to hold coumadin 4 days prior

## 2013-03-06 NOTE — Telephone Encounter (Signed)
Please see 02/28/13 phone note.

## 2013-03-12 NOTE — Progress Notes (Addendum)
Pt was called to do pre-op teaching and pt informed me that she was going to cancel and reschedule her TCS and EGD due to the weather. Pt asked about what she should do about her Coumadin because she had stopped it on 03/09/13 and was not using any Lovenox to bridge over. I paged Dr. Gala Romney and he said for her to start her Coumadin back today, 03/12/13, and they would plan on rescheduling her procedures within a week or two. Pt was instructed to start her Coumadin back today, 03/12/13, and to call Dr. Roseanne Kaufman office tomorrow, 03/11/13 because they were closed today, 03/12/13, to inform the office staff that they she wanted to reschedule. Pt verbalized understanding.

## 2013-03-20 ENCOUNTER — Ambulatory Visit (HOSPITAL_COMMUNITY)
Admission: RE | Admit: 2013-03-20 | Discharge: 2013-03-20 | Disposition: A | Discharge status: Home / Self Care | Payer: Medicare Other | Source: Ambulatory Visit | Attending: Internal Medicine | Admitting: Internal Medicine

## 2013-03-20 ENCOUNTER — Encounter (HOSPITAL_COMMUNITY): Payer: Self-pay | Admitting: *Deleted

## 2013-03-20 ENCOUNTER — Encounter (HOSPITAL_COMMUNITY)
Admission: RE | Disposition: A | Discharge status: Home / Self Care | Payer: Self-pay | Source: Ambulatory Visit | Attending: Internal Medicine

## 2013-03-20 DIAGNOSIS — I10 Essential (primary) hypertension: Secondary | ICD-10-CM | POA: Insufficient documentation

## 2013-03-20 DIAGNOSIS — Z7901 Long term (current) use of anticoagulants: Secondary | ICD-10-CM | POA: Insufficient documentation

## 2013-03-20 DIAGNOSIS — R195 Other fecal abnormalities: Secondary | ICD-10-CM

## 2013-03-20 DIAGNOSIS — K2940 Chronic atrophic gastritis without bleeding: Secondary | ICD-10-CM | POA: Insufficient documentation

## 2013-03-20 DIAGNOSIS — D509 Iron deficiency anemia, unspecified: Secondary | ICD-10-CM

## 2013-03-20 DIAGNOSIS — D126 Benign neoplasm of colon, unspecified: Secondary | ICD-10-CM | POA: Insufficient documentation

## 2013-03-20 DIAGNOSIS — A048 Other specified bacterial intestinal infections: Secondary | ICD-10-CM | POA: Insufficient documentation

## 2013-03-20 DIAGNOSIS — D129 Benign neoplasm of anus and anal canal: Secondary | ICD-10-CM

## 2013-03-20 DIAGNOSIS — D128 Benign neoplasm of rectum: Secondary | ICD-10-CM | POA: Insufficient documentation

## 2013-03-20 DIAGNOSIS — D649 Anemia, unspecified: Secondary | ICD-10-CM

## 2013-03-20 DIAGNOSIS — K5730 Diverticulosis of large intestine without perforation or abscess without bleeding: Secondary | ICD-10-CM | POA: Insufficient documentation

## 2013-03-20 DIAGNOSIS — I4891 Unspecified atrial fibrillation: Secondary | ICD-10-CM | POA: Insufficient documentation

## 2013-03-20 DIAGNOSIS — Z79899 Other long term (current) drug therapy: Secondary | ICD-10-CM | POA: Insufficient documentation

## 2013-03-20 DIAGNOSIS — K921 Melena: Secondary | ICD-10-CM | POA: Insufficient documentation

## 2013-03-20 HISTORY — PX: COLONOSCOPY WITH ESOPHAGOGASTRODUODENOSCOPY (EGD): SHX5779

## 2013-03-20 SURGERY — COLONOSCOPY WITH ESOPHAGOGASTRODUODENOSCOPY (EGD)
Anesthesia: Moderate Sedation

## 2013-03-20 MED ORDER — MEPERIDINE HCL 100 MG/ML IJ SOLN
INTRAMUSCULAR | Status: DC | PRN
  Administered 2013-03-20: 50 mg via INTRAVENOUS

## 2013-03-20 MED ORDER — MIDAZOLAM HCL 5 MG/5ML IJ SOLN
INTRAMUSCULAR | Status: AC
  Filled 2013-03-20: qty 10

## 2013-03-20 MED ORDER — STERILE WATER FOR IRRIGATION IR SOLN
Status: DC | PRN
  Administered 2013-03-20: 13:00:00

## 2013-03-20 MED ORDER — MEPERIDINE HCL 100 MG/ML IJ SOLN
INTRAMUSCULAR | Status: AC
  Filled 2013-03-20: qty 2

## 2013-03-20 MED ORDER — SODIUM CHLORIDE 0.9 % IV SOLN
INTRAVENOUS | Status: DC
  Administered 2013-03-20: 1000 mL via INTRAVENOUS

## 2013-03-20 MED ORDER — SODIUM CHLORIDE 0.9 % IJ SOLN
PREFILLED_SYRINGE | INTRAMUSCULAR | Status: DC | PRN
  Administered 2013-03-20: 14:00:00

## 2013-03-20 MED ORDER — ONDANSETRON HCL 4 MG/2ML IJ SOLN
INTRAMUSCULAR | Status: DC | PRN
  Administered 2013-03-20: 4 mg via INTRAVENOUS

## 2013-03-20 MED ORDER — ONDANSETRON HCL 4 MG/2ML IJ SOLN
INTRAMUSCULAR | Status: AC
  Filled 2013-03-20: qty 2

## 2013-03-20 MED ORDER — MIDAZOLAM HCL 5 MG/5ML IJ SOLN
INTRAMUSCULAR | Status: DC | PRN
  Administered 2013-03-20 (×2): 1 mg via INTRAVENOUS
  Administered 2013-03-20: 2 mg via INTRAVENOUS
  Administered 2013-03-20: 1 mg via INTRAVENOUS

## 2013-03-20 MED ORDER — LIDOCAINE VISCOUS 2 % MT SOLN
OROMUCOSAL | Status: DC | PRN
  Administered 2013-03-20: 1 via OROMUCOSAL

## 2013-03-20 NOTE — Op Note (Signed)
[facility] [address] [place], [address]   COLONOSCOPY PROCEDURE REPORT  PATIENT: [name], [name]  MR#:         [id number] BIRTHDATE: 1941-01-08 , 72  yrs. old GENDER: Female ENDOSCOPIST: R.  Garfield Cornea, MD FACP FACG REFERRED BY:  Yehuda Savannah, M.D.  Delphina Cahill, M.D. PROCEDURE DATE:  03/20/2013 PROCEDURE:     Colonoscopy with multiple snare polypectomies, APC ablation and hemostasis clips placement  INDICATIONS: Iron deficiency anemia; Hemoccult positive stool  INFORMED CONSENT:  The risks, benefits, alternatives and imponderables including but not limited to bleeding, perforation as well as the possibility of a missed lesion have been reviewed.  The potential for biopsy, lesion removal, etc. have also been discussed.  Questions have been answered.  All parties agreeable. Please see the history and physical in the medical record for more information.  MEDICATIONS: Versed 5 mg IV and Demerol 50 mg IV in divided doses. Zofran 4 mg IV. 10 cc 100,000 epinephrine  DESCRIPTION OF PROCEDURE:  After a digital rectal exam was performed, the EG-[id number] ([id number]) and EC-[id number] ([id number]) colonoscope was advanced from the anus through the rectum and colon to the area of the cecum, ileocecal valve and appendiceal orifice. The cecum was deeply intubated.  These structures were well-seen and photographed for the record.  From the level of the cecum and ileocecal valve, the scope was slowly and cautiously withdrawn. The mucosal surfaces were carefully surveyed utilizing scope tip deflection to facilitate fold flattening as needed.  The scope was pulled down into the rectum where a thorough examination including retroflexion was performed.    FINDINGS:  Adequate prep. (1) 4 mm polyp in the rectum at 5 cm; otherwise, remainder of the rectum appeared normal;  patient had scattered left-sided diverticula;  In the cecum, the patient had a sprawling 1.5 x 2 cm carpet  polyp adjacent to the appendiceal orifice. The patient also had a 1 cm x 3 cm sprawling carpet polyp just distal to the ileocecal valve. The remainder of the colonic mucosa appeared normal.  THERAPEUTIC / DIAGNOSTIC MANEUVERS PERFORMED:   The large polyps in the cecum and ileocecal valve were lifted away from the colonic wall nicely with total of 20 cc of diluted 1 in 10,000 epinephrine (I utilized 10 cc of normal saline mixed with 10 cc of 1-10,000 epinephrine to minimize the potential for any systemic absorption of epinephrine producing any adverse cardiac effects).  Both polyps lifted nicely away from the colonic wall. Subsequently multiple piecemeal snare polypectomies were performed, submitting multiple polyp fragments from these 2 lesions together to the pathologist. There may have been some residual polyp tissue at the periphery the polypectomy site. These areas were touched up with the APC on right colon setting. All of these maneuvers were done without difficulty or apparent complication. Subsequently, I elected to get  close the cecal polypectomy defect with 2 instinct clips;  the larger polypectomy site just distal to the ileocecal valve was closed with 3 instinct clips.  The rectal polyp was hot snare removed and recovered for the pathologist.  COMPLICATIONS: None  CECAL WITHDRAWAL TIME:  55 minutes  IMPRESSION:  Rectal polyp-removed as described above. Polyp at ileocecal valve and cecum requiring saline/epinephrine injection assistance piecemeal polypectomy with subsequent APC ablation and clip closure.  RECOMMENDATIONS:  Wait until February 1 before resuming Coumadin. This approach was discussed with the patient and family members. Followup on pathology. See EGD report.   _______________________________ eSigned:  R.  Garfield Cornea, MD FACP [facility] 03/20/2013 3:13 PM   CC:    PATIENT NAME:  [name], [name] MR#: [id number]

## 2013-03-20 NOTE — Interval H&P Note (Signed)
History and Physical Interval Note:  03/20/2013 12:58 PM  [name] [name]  has presented today for surgery, with the diagnosis of ANEMIA AND HEME POSITIVE STOOL  The various methods of treatment have been discussed with the patient and family. After consideration of risks, benefits and other options for treatment, the patient has consented to  Procedure(s) with comments: COLONOSCOPY WITH ESOPHAGOGASTRODUODENOSCOPY (EGD) (N/A) - 9:30-moved to [place] notified pt as a surgical intervention .  The patient's history has been reviewed, patient examined, no change in status, stable for surgery.  I have reviewed the patient's chart and labs.  Questions were answered to the patient's satisfaction.   No change. No Coumadin x5 days per EGD and colonoscopy per plan.The risks, benefits, limitations, imponderables and alternatives regarding both EGD and colonoscopy have been reviewed with the patient. Questions have been answered. All parties agreeable.   Manus Rudd

## 2013-03-20 NOTE — H&P (View-Only) (Signed)
Primary Care Physician:  Delphina Cahill, MD Primary Gastroenterologist:  Dr. Gala Romney  Chief Complaint  Patient presents with  . Rectal Bleeding    HPI:   [name] [name] presents today at the request of both Dr. Nevada Crane and Dr. Lowanda Foster secondary to heme positive stool. She is on chronic Coumadin for chronic afib. 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 Appears Aug 2014 ferritin was 11, Iron 22. Ferritin improved to 22 and iron 69 in Oct 2014. Hgb 10.8 in October 2014. Most recent labs not available at time of visit.   No abdominal pain. States she feels like bowels are more sluggish. No hematochezia or melena. States appetite is not like it "used to be". Decreased portion sizes. Feels like bowel movements are slower coming but denies constipation. Walks a lot. Weight fluctuates but no abnormal weight loss or unintentional weight loss. No reflux, dysphagia. No fatigue, weakness.   No prior colonoscopy or upper endoscopy.   Past Medical History  Diagnosis Date  . Nonischemic cardiomyopathy 1998    EF of 25% in 1998, 15% in 2006; catheterization in 3/07-50% D1; luminal      irregularities in the other vessels; PA-65/25. 20% EF in 04/2007 with moderate MR and pulmonary htn; AICD/biventricular pacing--05/2005  . Hypertension   . Atrial fibrillation     plus nonsustained ventricular tachycardia --> amiodarone treatment  . Nonsustained ventricular tachycardia   . OSA (obstructive sleep apnea)     no sleep machine  . Chronic kidney disease, stage 4, severely decreased GFR     Creatinine of 2.1 in 09/2008, 1.9 in 11/1998 and, 2.3 in 05/2010  . Venous stasis   . Gout     Uric acid of 12.5 in 03/2009  . Chronic anticoagulation   . History of lupus   . CHF (congestive heart failure)   . Chronic kidney disease     Past Surgical History  Procedure Laterality Date  .  Appendectomy  1987  . Incision and drainage intra oral abscess  1991    submandibular abscess  . Cardiac defibrillator placement  05/2005    Medtronic Insync, Biventricular    Current Outpatient Prescriptions  Medication Sig Dispense Refill  . acetaminophen (TYLENOL) 500 MG tablet Take 500 mg by mouth as needed. For pain      . allopurinol (ZYLOPRIM) 100 MG tablet Take 100 mg by mouth daily.       Marland Kitchen amiodarone (PACERONE) 200 MG tablet Take 200 mg by mouth as directed. 1/2 tablet Monday through Friday and 1 tablet sat and sun      . buPROPion (WELLBUTRIN SR) 100 MG 12 hr tablet Take 1 tablet (100 mg total) by mouth daily.  90 tablet  0  . carvedilol (COREG) 25 MG tablet Take 1 tablet (25 mg total) by mouth 2 (two) times daily with a meal.  180 tablet  3  . Chlorpheniramine Maleate (ALLERGY RELIEF PO) Take by mouth.      . colchicine 0.6 MG tablet Take 0.6 mg by mouth as needed.      . digoxin (LANOXIN) 0.125 MG tablet Take 1 tablet (125 mcg total) by mouth every other day.  30 tablet  6  . enalapril (VASOTEC) 10 MG tablet Take 10 mg by mouth daily.       Marland Kitchen LASIX 40 MG tablet TAKE  1 TABLET BY MOUTH   TWICE A DAY.  60 each  0  . spironolactone (ALDACTONE) 25 MG tablet Take 1 tablet (25 mg total) by mouth daily.  90 tablet  3  . Vitamin D, Ergocalciferol, (DRISDOL) 50000 UNITS CAPS Take 50,000 Units by mouth every 7 (seven) days.      . warfarin (COUMADIN) 5 MG tablet TAKE 1-2 TABLETS BY MOUTH DAILY OR AS DIRECTED BY COUMADIN CLINIC.  45 tablet  2   No current facility-administered medications for this visit.    Allergies as of 02/21/2013 - Review Complete 02/21/2013  Allergen Reaction Noted  . Penicillins Other (See Comments)     Family History  Problem Relation Age of Onset  . Heart attack Mother 67  . Kidney failure Father 79  . Heart attack Brother   . Colon cancer Neg Hx     History   Social History  . Marital Status: Single    Spouse Name: N/A    Number of Children:  0    . Years of Education: N/A   Occupational History  . Retired     factory work; home health aide   Social History Main Topics  . Smoking status: Current Every Day Smoker -- 0.50 packs/day for 30 years    Types: Cigarettes  . Smokeless tobacco: Never Used     Comment: pt states that she is trying to quit  . Alcohol Use: No  . Drug Use: No  . Sexual Activity: Not on file   Other Topics Concern  . Not on file   Social History Narrative   Resides with a niece in [place]    Review of Systems: Gen: Denies any fever, chills, fatigue, weight loss, lack of appetite.  CV: Denies chest pain, heart palpitations, peripheral edema, syncope.  Resp: Denies shortness of breath at rest or with exertion. Denies wheezing or cough.  GI: see HPI GU : Denies urinary burning, urinary frequency, urinary hesitancy MS: Denies joint pain, muscle weakness, cramps, or limitation of movement.  Derm: Denies rash, itching, dry skin Psych: Denies depression, anxiety, memory loss, and confusion Heme: Denies bruising, bleeding, and enlarged lymph nodes.  Physical Exam: BP 122/78  Pulse 90  Temp(Src) 97.6 F (36.4 C) (Oral)  Wt 200 lb 6.4 oz (90.901 kg) General:   Alert and oriented. Pleasant and cooperative. Well-nourished and well-developed.  Head:  Normocephalic and atraumatic. Eyes:  Without icterus, sclera clear and conjunctiva pink.  Ears:  Normal auditory acuity. Nose:  No deformity, discharge,  or lesions. Mouth:  No deformity or lesions, oral mucosa pink.  Neck:  Supple, without mass or thyromegaly. Lungs:  Clear to auscultation bilaterally. No wheezes, rales, or rhonchi. No distress.  Heart:  S1, S2 present without murmurs appreciated.  Abdomen:  +BS, soft, non-tender and non-distended. No HSM noted. No guarding or rebound. No masses appreciated.  Rectal:  Deferred  Msk:  Symmetrical without gross deformities. Normal posture. Extremities:  Without clubbing or edema. Neurologic:  Alert and   oriented x4;  grossly normal neurologically. Skin:  Intact without significant lesions or rashes. Cervical Nodes:  No significant cervical adenopathy. Psych:  Alert and cooperative. Normal mood and affect.    

## 2013-03-20 NOTE — Op Note (Signed)
[facility] [address] [place], [address]   ENDOSCOPY PROCEDURE REPORT  PATIENT: [name] [name], [name]  MR#: [id number] BIRTHDATE: [birth year] , 72  yrs. old GENDER: Female ENDOSCOPIST: R.  Garfield Cornea, MD FACP FACG REFERRED BY:  Fran Lowes, M.D.  Delphina Cahill, M.D. PROCEDURE DATE:  03/20/2013 PROCEDURE:     EGD with gastric biopsy  INDICATIONS:     iron deficiency anemia; Hemoccult positive stool  INFORMED CONSENT:   The risks, benefits, limitations, alternatives and imponderables have been discussed.  The potential for biopsy, esophogeal dilation, etc. have also been reviewed.  Questions have been answered.  All parties agreeable.  Please see the history and physical in the medical record for more information.  MEDICATIONS:   Versed 2 mg IV and Demerol 50 mg IV. Xylocaine gel 2% orally. Zofran 4 mg IV  DESCRIPTION OF PROCEDURE:   The [id number]-[id number] ([id number])  endoscope was introduced through the mouth and advanced to the second portion of the duodenum without difficulty or limitations.  The mucosal surfaces were surveyed very carefully during advancement of the scope and upon withdrawal.  Retroflexion view of the proximal stomach and esophagogastric junction was performed.      FINDINGS: Normal esophagus. Stomach empty. Small hiatal hernia. Scattered  gastric erosions. 3 mm areas of nodularity with central area of depression and erosion giving a " volcano" appearance on the anterior gastric wall. Please see above photos.. These appeared somewhat hemorrhagic as if they have bled recently. No out and out ulcer or infiltrating process;  pylorus patent. Normal first and second portion of the duodenum.  THERAPEUTIC / DIAGNOSTIC MANEUVERS PERFORMED:  Biopsies abnormal gastric mucosa taken. A biopsy of one of the "volcano lesions" described above taken. This biopsy site oozed quite a bit after biopsy. Subsequently,  I placed an  instinct clip on it with  good hemostasis.   COMPLICATIONS:  None  IMPRESSION:   Gastric erosions and nodularity as described above  - status post biopsy  - status post hemostasis cliping as described above.  RECOMMENDATIONS:   Followup on pathology. See colonoscopy report. No MRI until clip gone    _______________________________ R. Garfield Cornea, MD FACP [facility] eSigned:  R. Garfield Cornea, MD FACP [facility] 03/20/2013 1:44 PM     CC:  PATIENT NAME:  [name], [name] MR#: [id number]

## 2013-03-20 NOTE — Discharge Instructions (Addendum)
EGD Discharge instructions Please read the instructions outlined below and refer to this sheet in the next few weeks. These discharge instructions provide you with general information on caring for yourself after you leave the hospital. Your doctor may also give you specific instructions. While your treatment has been planned according to the most current medical practices available, unavoidable complications occasionally occur. If you have any problems or questions after discharge, please call your doctor. ACTIVITY  You may resume your regular activity but move at a slower pace for the next 24 hours.   Take frequent rest periods for the next 24 hours.   Walking will help expel (get rid of) the air and reduce the bloated feeling in your abdomen.   No driving for 24 hours (because of the anesthesia (medicine) used during the test).   You may shower.   Do not sign any important legal documents or operate any machinery for 24 hours (because of the anesthesia used during the test).  NUTRITION  Drink plenty of fluids.   You may resume your normal diet.   Begin with a light meal and progress to your normal diet.   Avoid alcoholic beverages for 24 hours or as instructed by your caregiver.  MEDICATIONS  You may resume your normal medications unless your caregiver tells you otherwise.  WHAT YOU CAN EXPECT TODAY  You may experience abdominal discomfort such as a feeling of fullness or gas pains.  FOLLOW-UP  Your doctor will discuss the results of your test with you.  SEEK IMMEDIATE MEDICAL ATTENTION IF ANY OF THE FOLLOWING OCCUR:  Excessive nausea (feeling sick to your stomach) and/or vomiting.   Severe abdominal pain and distention (swelling).   Trouble swallowing.   Temperature over 101 F (37.8 C).   Rectal bleeding or vomiting of blood.   Colonoscopy Discharge Instructions  Read the instructions outlined below and refer to this sheet in the next few weeks. These  discharge instructions provide you with general information on caring for yourself after you leave the hospital. Your doctor may also give you specific instructions. While your treatment has been planned according to the most current medical practices available, unavoidable complications occasionally occur. If you have any problems or questions after discharge, call Dr. Gala Romney at [phone]. ACTIVITY  You may resume your regular activity, but move at a slower pace for the next 24 hours.   Take frequent rest periods for the next 24 hours.   Walking will help get rid of the air and reduce the bloated feeling in your belly (abdomen).   No driving for 24 hours (because of the medicine (anesthesia) used during the test).    Do not sign any important legal documents or operate any machinery for 24 hours (because of the anesthesia used during the test).  NUTRITION  Drink plenty of fluids.   You may resume your normal diet as instructed by your doctor.   Begin with a light meal and progress to your normal diet. Heavy or fried foods are harder to digest and may make you feel sick to your stomach (nauseated).   Avoid alcoholic beverages for 24 hours or as instructed.  MEDICATIONS  You may resume your normal medications unless your doctor tells you otherwise.  WHAT YOU CAN EXPECT TODAY  Some feelings of bloating in the abdomen.   Passage of more gas than usual.   Spotting of blood in your stool or on the toilet paper.  IF YOU HAD POLYPS REMOVED DURING THE COLONOSCOPY:  No aspirin products for 7 days or as instructed.   No alcohol for 7 days or as instructed.   Eat a soft diet for the next 24 hours.  FINDING OUT THE RESULTS OF YOUR TEST Not all test results are available during your visit. If your test results are not back during the visit, make an appointment with your caregiver to find out the results. Do not assume everything is normal if you have not heard from your caregiver or the  medical facility. It is important for you to follow up on all of your test results.  SEEK IMMEDIATE MEDICAL ATTENTION IF:  You have more than a spotting of blood in your stool.   Your belly is swollen (abdominal distention).   You are nauseated or vomiting.   You have a temperature over 101.   You have abdominal pain or discomfort that is severe or gets worse throughout the day.    Diverticulosis and polyp information provided  Wait until March 1 before resuming Coumadin  No MRI until clips gone  Further recommendations to follow pending review of pathology report   Colon Polyps Polyps are lumps of extra tissue growing inside the body. Polyps can grow in the large intestine (colon). Most colon polyps are noncancerous (benign). However, some colon polyps can become cancerous over time. Polyps that are larger than a pea may be harmful. To be safe, caregivers remove and test all polyps. CAUSES  Polyps form when mutations in the genes cause your cells to grow and divide even though no more tissue is needed. RISK FACTORS There are a number of risk factors that can increase your chances of getting colon polyps. They include:  Being older than 50 years.  Family history of colon polyps or colon cancer.  Long-term colon diseases, such as colitis or Crohn disease.  Being overweight.  Smoking.  Being inactive.  Drinking too much alcohol. SYMPTOMS  Most small polyps do not cause symptoms. If symptoms are present, they may include:  Blood in the stool. The stool may look dark red or black.  Constipation or diarrhea that lasts longer than 1 week. DIAGNOSIS People often do not know they have polyps until their caregiver finds them during a regular checkup. Your caregiver can use 4 tests to check for polyps:  Digital rectal exam. The caregiver wears gloves and feels inside the rectum. This test would find polyps only in the rectum.  Barium enema. The caregiver puts a liquid  called barium into your rectum before taking X-rays of your colon. Barium makes your colon look white. Polyps are dark, so they are easy to see in the X-ray pictures.  Sigmoidoscopy. A thin, flexible tube (sigmoidoscope) is placed into your rectum. The sigmoidoscope has a light and tiny camera in it. The caregiver uses the sigmoidoscope to look at the last third of your colon.  Colonoscopy. This test is like sigmoidoscopy, but the caregiver looks at the entire colon. This is the most common method for finding and removing polyps. TREATMENT  Any polyps will be removed during a sigmoidoscopy or colonoscopy. The polyps are then tested for cancer. PREVENTION  To help lower your risk of getting more colon polyps:  Eat plenty of fruits and vegetables. Avoid eating fatty foods.  Do not smoke.  Avoid drinking alcohol.  Exercise every day.  Lose weight if recommended by your caregiver.  Eat plenty of calcium and folate. Foods that are rich in calcium include milk, cheese, and broccoli. Foods that  are rich in folate include chickpeas, kidney beans, and spinach. HOME CARE INSTRUCTIONS Keep all follow-up appointments as directed by your caregiver. You may need periodic exams to check for polyps. SEEK MEDICAL CARE IF: You notice bleeding during a bowel movement. Document Released: 10/07/2003 Document Revised: 04/04/2011 Document Reviewed: 03/22/2011 [facility] Patient Information 2014 [place]. Diverticulosis Diverticulosis is a common condition that develops when small pouches (diverticula) form in the wall of the colon. The risk of diverticulosis increases with age. It happens more often in people who eat a low-fiber diet. Most individuals with diverticulosis have no symptoms. Those individuals with symptoms usually experience abdominal pain, constipation, or loose stools (diarrhea). HOME CARE INSTRUCTIONS   Increase the amount of fiber in your diet as directed by your caregiver or  dietician. This may reduce symptoms of diverticulosis.  Your caregiver may recommend taking a dietary fiber supplement.  Drink at least 6 to 8 glasses of water each day to prevent constipation.  Try not to strain when you have a bowel movement.  Your caregiver may recommend avoiding nuts and seeds to prevent complications, although this is still an uncertain benefit.  Only take over-the-counter or prescription medicines for pain, discomfort, or fever as directed by your caregiver. FOODS WITH HIGH FIBER CONTENT INCLUDE:  Fruits. Apple, peach, pear, tangerine, raisins, prunes.  Vegetables. Brussels sprouts, asparagus, broccoli, cabbage, carrot, cauliflower, romaine lettuce, spinach, summer squash, tomato, winter squash, zucchini.  Starchy Vegetables. Baked beans, kidney beans, lima beans, split peas, lentils, potatoes (with skin).  Grains. Whole wheat bread, brown rice, bran flake cereal, plain oatmeal, white rice, shredded wheat, bran muffins. SEEK IMMEDIATE MEDICAL CARE IF:   You develop increasing pain or severe bloating.  You have an oral temperature above 102 F (38.9 C), not controlled by medicine.  You develop vomiting or bowel movements that are bloody or black. Document Released: 10/08/2003 Document Revised: 04/04/2011 Document Reviewed: 06/10/2009 [facility] Patient Information 2014 [place].

## 2013-03-22 ENCOUNTER — Encounter: Payer: Self-pay | Admitting: Internal Medicine

## 2013-03-25 ENCOUNTER — Encounter (HOSPITAL_COMMUNITY): Payer: Self-pay | Admitting: Internal Medicine

## 2013-03-25 ENCOUNTER — Telehealth: Payer: Self-pay

## 2013-03-25 DIAGNOSIS — Z5181 Encounter for therapeutic drug level monitoring: Secondary | ICD-10-CM

## 2013-03-25 NOTE — Telephone Encounter (Signed)
Letter from: Daneil Dolin  Reason for Letter: Results Review  Send letter to patient.  Send copy of letter with path to referring provider and PCP.  Almyra Free: Need Pylera x 10 days along with prilosec 20 mg twice daily x 10 days.  Ov in 6 months

## 2013-03-25 NOTE — Telephone Encounter (Signed)
Per RMR-plse have pt decrease coumadin by 50% while on pylera; check INR on day 6 of therapy. thanks

## 2013-03-25 NOTE — Telephone Encounter (Signed)
Results Cc to PCP  

## 2013-03-25 NOTE — Telephone Encounter (Signed)
Reminder in epic °

## 2013-03-26 NOTE — Telephone Encounter (Signed)
Tried to call pt- LMOM 

## 2013-04-01 ENCOUNTER — Encounter: Payer: Medicare Other | Admitting: *Deleted

## 2013-04-03 ENCOUNTER — Ambulatory Visit (INDEPENDENT_AMBULATORY_CARE_PROVIDER_SITE_OTHER): Payer: Medicare Other | Admitting: *Deleted

## 2013-04-03 ENCOUNTER — Other Ambulatory Visit: Payer: Self-pay

## 2013-04-03 DIAGNOSIS — Z5181 Encounter for therapeutic drug level monitoring: Secondary | ICD-10-CM

## 2013-04-03 DIAGNOSIS — I4891 Unspecified atrial fibrillation: Secondary | ICD-10-CM

## 2013-04-03 DIAGNOSIS — Z7901 Long term (current) use of anticoagulants: Secondary | ICD-10-CM

## 2013-04-03 LAB — POCT INR: INR: 1.6

## 2013-04-03 MED ORDER — BIS SUBCIT-METRONID-TETRACYC 140-125-125 MG PO CAPS: 3.0000 | ORAL_CAPSULE | Freq: Three times a day (TID) | ORAL | Status: DC

## 2013-04-03 MED ORDER — OMEPRAZOLE 20 MG PO CPDR: 20.0000 mg | DELAYED_RELEASE_CAPSULE | Freq: Two times a day (BID) | ORAL | Status: DC

## 2013-04-03 NOTE — Telephone Encounter (Signed)
Per pt request- faxed a copy of this to Dr. Florentina Addison office. Lab order mailed to pt.

## 2013-04-03 NOTE — Telephone Encounter (Signed)
Pt is aware. rx sent to the pharmacy.   [name] [name], please note that we are putting your patient on pylera and we have decreased her coumadin 50% while on this. I am putting in an order for an INR on day 6 of treatment and mailing it to the patient.

## 2013-04-05 ENCOUNTER — Ambulatory Visit (INDEPENDENT_AMBULATORY_CARE_PROVIDER_SITE_OTHER): Payer: Medicare Other | Admitting: *Deleted

## 2013-04-05 DIAGNOSIS — I428 Other cardiomyopathies: Secondary | ICD-10-CM

## 2013-04-05 LAB — MDC_IDC_ENUM_SESS_TYPE_INCLINIC
Battery Voltage: 2.94 V
Lead Channel Impedance Value: 437 Ohm
Lead Channel Impedance Value: 494 Ohm
Lead Channel Pacing Threshold Amplitude: 0.75 V
Lead Channel Pacing Threshold Pulse Width: 0.4 ms
Lead Channel Pacing Threshold Pulse Width: 0.4 ms
Lead Channel Pacing Threshold Pulse Width: 0.8 ms
Lead Channel Sensing Intrinsic Amplitude: 18.1 mV
Lead Channel Sensing Intrinsic Amplitude: 3 mV
Lead Channel Setting Pacing Amplitude: 2.5 V
Lead Channel Setting Pacing Amplitude: 2.5 V
Lead Channel Setting Pacing Pulse Width: 0.4 ms
Lead Channel Setting Pacing Pulse Width: 0.4 ms
Lead Channel Setting Sensing Sensitivity: 0.3 mV
MDC IDC MSMT LEADCHNL LV PACING THRESHOLD AMPLITUDE: 1 V
MDC IDC MSMT LEADCHNL RA PACING THRESHOLD AMPLITUDE: 1 V
MDC IDC MSMT LEADCHNL RV IMPEDANCE VALUE: 570 Ohm
MDC IDC SET LEADCHNL RA PACING AMPLITUDE: 2 V
MDC IDC SET ZONE DETECTION INTERVAL: 290 ms
MDC IDC SET ZONE DETECTION INTERVAL: 350 ms
MDC IDC STAT BRADY RA PERCENT PACED: 98.5 %
Zone Setting Detection Interval: 350 ms
Zone Setting Detection Interval: 400 ms

## 2013-04-05 NOTE — Progress Notes (Signed)
ICD check in office with ICM. 

## 2013-04-08 ENCOUNTER — Ambulatory Visit (INDEPENDENT_AMBULATORY_CARE_PROVIDER_SITE_OTHER): Payer: Medicare Other | Admitting: *Deleted

## 2013-04-08 DIAGNOSIS — Z7901 Long term (current) use of anticoagulants: Secondary | ICD-10-CM

## 2013-04-08 DIAGNOSIS — Z5181 Encounter for therapeutic drug level monitoring: Secondary | ICD-10-CM

## 2013-04-08 DIAGNOSIS — I4891 Unspecified atrial fibrillation: Secondary | ICD-10-CM

## 2013-04-08 LAB — POCT INR: INR: 1.8

## 2013-04-11 ENCOUNTER — Ambulatory Visit (INDEPENDENT_AMBULATORY_CARE_PROVIDER_SITE_OTHER): Payer: Medicare Other | Admitting: *Deleted

## 2013-04-11 DIAGNOSIS — I4891 Unspecified atrial fibrillation: Secondary | ICD-10-CM

## 2013-04-11 DIAGNOSIS — Z5181 Encounter for therapeutic drug level monitoring: Secondary | ICD-10-CM

## 2013-04-11 DIAGNOSIS — Z7901 Long term (current) use of anticoagulants: Secondary | ICD-10-CM

## 2013-04-11 LAB — POCT INR: INR: 2.2

## 2013-04-18 ENCOUNTER — Encounter: Payer: Self-pay | Admitting: Internal Medicine

## 2013-04-25 ENCOUNTER — Ambulatory Visit (INDEPENDENT_AMBULATORY_CARE_PROVIDER_SITE_OTHER): Payer: Medicare Other | Admitting: *Deleted

## 2013-04-25 DIAGNOSIS — Z5181 Encounter for therapeutic drug level monitoring: Secondary | ICD-10-CM

## 2013-04-25 DIAGNOSIS — I4891 Unspecified atrial fibrillation: Secondary | ICD-10-CM

## 2013-04-25 DIAGNOSIS — Z7901 Long term (current) use of anticoagulants: Secondary | ICD-10-CM

## 2013-04-25 LAB — POCT INR: INR: 5.6

## 2013-04-29 ENCOUNTER — Ambulatory Visit (INDEPENDENT_AMBULATORY_CARE_PROVIDER_SITE_OTHER): Payer: Medicare Other | Admitting: *Deleted

## 2013-04-29 DIAGNOSIS — Z5181 Encounter for therapeutic drug level monitoring: Secondary | ICD-10-CM

## 2013-04-29 DIAGNOSIS — Z7901 Long term (current) use of anticoagulants: Secondary | ICD-10-CM

## 2013-04-29 DIAGNOSIS — I4891 Unspecified atrial fibrillation: Secondary | ICD-10-CM

## 2013-04-29 LAB — POCT INR: INR: 3.8

## 2013-05-06 ENCOUNTER — Ambulatory Visit (INDEPENDENT_AMBULATORY_CARE_PROVIDER_SITE_OTHER): Payer: Medicare Other | Admitting: *Deleted

## 2013-05-06 DIAGNOSIS — I4891 Unspecified atrial fibrillation: Secondary | ICD-10-CM

## 2013-05-06 DIAGNOSIS — Z7901 Long term (current) use of anticoagulants: Secondary | ICD-10-CM

## 2013-05-06 DIAGNOSIS — Z5181 Encounter for therapeutic drug level monitoring: Secondary | ICD-10-CM

## 2013-05-06 LAB — POCT INR: INR: 4.1

## 2013-05-20 ENCOUNTER — Ambulatory Visit (INDEPENDENT_AMBULATORY_CARE_PROVIDER_SITE_OTHER): Payer: Medicare Other | Admitting: *Deleted

## 2013-05-20 DIAGNOSIS — Z7901 Long term (current) use of anticoagulants: Secondary | ICD-10-CM

## 2013-05-20 DIAGNOSIS — Z5181 Encounter for therapeutic drug level monitoring: Secondary | ICD-10-CM

## 2013-05-20 DIAGNOSIS — I4891 Unspecified atrial fibrillation: Secondary | ICD-10-CM

## 2013-05-20 LAB — POCT INR: INR: 2.9

## 2013-06-05 NOTE — Progress Notes (Signed)
Can we have most recent CBC, iron, ferritin? Patient may have had drawn through Dr. Lowanda Foster.  If she hasn't had done, we need to recheck.

## 2013-06-05 NOTE — Progress Notes (Signed)
Chelsey, will you get labs please.

## 2013-06-07 ENCOUNTER — Ambulatory Visit (INDEPENDENT_AMBULATORY_CARE_PROVIDER_SITE_OTHER): Payer: Medicare Other | Admitting: *Deleted

## 2013-06-07 ENCOUNTER — Ambulatory Visit: Payer: Medicare Other | Admitting: *Deleted

## 2013-06-07 DIAGNOSIS — I428 Other cardiomyopathies: Secondary | ICD-10-CM

## 2013-06-07 NOTE — Progress Notes (Signed)
Helped to hook [employer] with cell adapter/kwm

## 2013-06-07 NOTE — Progress Notes (Signed)
Remote ICD transmission.   

## 2013-06-10 ENCOUNTER — Ambulatory Visit (INDEPENDENT_AMBULATORY_CARE_PROVIDER_SITE_OTHER): Payer: Medicare Other | Admitting: *Deleted

## 2013-06-10 DIAGNOSIS — I4891 Unspecified atrial fibrillation: Secondary | ICD-10-CM

## 2013-06-10 DIAGNOSIS — Z7901 Long term (current) use of anticoagulants: Secondary | ICD-10-CM

## 2013-06-10 DIAGNOSIS — Z5181 Encounter for therapeutic drug level monitoring: Secondary | ICD-10-CM

## 2013-06-10 LAB — POCT INR: INR: 4.3

## 2013-06-10 NOTE — Progress Notes (Signed)
Requested Labs

## 2013-06-10 NOTE — Progress Notes (Signed)
Records are in your chart.

## 2013-06-26 ENCOUNTER — Ambulatory Visit (INDEPENDENT_AMBULATORY_CARE_PROVIDER_SITE_OTHER): Payer: Medicare Other | Admitting: *Deleted

## 2013-06-26 DIAGNOSIS — I4891 Unspecified atrial fibrillation: Secondary | ICD-10-CM

## 2013-06-26 DIAGNOSIS — Z5181 Encounter for therapeutic drug level monitoring: Secondary | ICD-10-CM

## 2013-06-26 DIAGNOSIS — Z7901 Long term (current) use of anticoagulants: Secondary | ICD-10-CM

## 2013-06-26 LAB — POCT INR: INR: 4.6

## 2013-07-08 ENCOUNTER — Telehealth: Payer: Self-pay | Admitting: Cardiology

## 2013-07-08 ENCOUNTER — Ambulatory Visit (INDEPENDENT_AMBULATORY_CARE_PROVIDER_SITE_OTHER): Payer: Medicare Other | Admitting: *Deleted

## 2013-07-08 ENCOUNTER — Encounter: Payer: Medicare Other | Admitting: *Deleted

## 2013-07-08 DIAGNOSIS — Z7901 Long term (current) use of anticoagulants: Secondary | ICD-10-CM

## 2013-07-08 DIAGNOSIS — I4891 Unspecified atrial fibrillation: Secondary | ICD-10-CM

## 2013-07-08 DIAGNOSIS — Z5181 Encounter for therapeutic drug level monitoring: Secondary | ICD-10-CM

## 2013-07-08 LAB — POCT INR: INR: 2.2

## 2013-07-08 NOTE — Telephone Encounter (Signed)
LMOVM reminding pt to send remote transmission.   

## 2013-07-09 ENCOUNTER — Encounter: Payer: Self-pay | Admitting: Cardiology

## 2013-07-15 ENCOUNTER — Telehealth: Payer: Self-pay | Admitting: Internal Medicine

## 2013-07-15 NOTE — Telephone Encounter (Signed)
New Messages  Pt called to discuss transmission not going through for 07/08/2013// please call

## 2013-07-16 NOTE — Telephone Encounter (Signed)
LMOVM for pt to return call 

## 2013-07-17 NOTE — Telephone Encounter (Signed)
LMOVM for pt to return call. Second attempt.

## 2013-07-19 ENCOUNTER — Ambulatory Visit (INDEPENDENT_AMBULATORY_CARE_PROVIDER_SITE_OTHER): Payer: Medicare Other | Admitting: *Deleted

## 2013-07-19 ENCOUNTER — Encounter: Payer: Self-pay | Admitting: Internal Medicine

## 2013-07-19 DIAGNOSIS — I428 Other cardiomyopathies: Secondary | ICD-10-CM

## 2013-07-19 LAB — MDC_IDC_ENUM_SESS_TYPE_REMOTE
Battery Voltage: 2.89 V
Brady Statistic AP VP Percent: 95.54 %
Brady Statistic AS VS Percent: 1.75 %
Brady Statistic RA Percent Paced: 96.45 %
Brady Statistic RV Percent Paced: 97.34 %
HIGH POWER IMPEDANCE MEASURED VALUE: 66 Ohm
HighPow Impedance: 209 Ohm
HighPow Impedance: 399 Ohm
HighPow Impedance: 51 Ohm
Lead Channel Impedance Value: 437 Ohm
Lead Channel Impedance Value: 494 Ohm
Lead Channel Pacing Threshold Amplitude: 0.875 V
Lead Channel Pacing Threshold Amplitude: 1.875 V
Lead Channel Pacing Threshold Pulse Width: 0.4 ms
Lead Channel Pacing Threshold Pulse Width: 0.4 ms
Lead Channel Sensing Intrinsic Amplitude: 12 mV
Lead Channel Setting Pacing Amplitude: 2.5 V
Lead Channel Setting Pacing Pulse Width: 0.4 ms
Lead Channel Setting Pacing Pulse Width: 0.4 ms
MDC IDC MSMT LEADCHNL LV IMPEDANCE VALUE: 703 Ohm
MDC IDC MSMT LEADCHNL LV PACING THRESHOLD AMPLITUDE: 1.5 V
MDC IDC MSMT LEADCHNL RA IMPEDANCE VALUE: 437 Ohm
MDC IDC MSMT LEADCHNL RA SENSING INTR AMPL: 2.125 mV
MDC IDC MSMT LEADCHNL RV IMPEDANCE VALUE: 494 Ohm
MDC IDC MSMT LEADCHNL RV PACING THRESHOLD PULSEWIDTH: 0.4 ms
MDC IDC SESS DTM: 20150626162341
MDC IDC SET LEADCHNL RA PACING AMPLITUDE: 2 V
MDC IDC SET LEADCHNL RV PACING AMPLITUDE: 2.5 V
MDC IDC SET LEADCHNL RV SENSING SENSITIVITY: 0.3 mV
MDC IDC SET ZONE DETECTION INTERVAL: 350 ms
MDC IDC STAT BRADY AP VS PERCENT: 0.91 %
MDC IDC STAT BRADY AS VP PERCENT: 1.8 %
Zone Setting Detection Interval: 290 ms
Zone Setting Detection Interval: 350 ms
Zone Setting Detection Interval: 400 ms

## 2013-07-19 NOTE — Telephone Encounter (Signed)
LMOVM for pt to return call. 3rd attempt.

## 2013-07-19 NOTE — Progress Notes (Signed)
Remote ICD transmission.   

## 2013-07-25 ENCOUNTER — Ambulatory Visit (INDEPENDENT_AMBULATORY_CARE_PROVIDER_SITE_OTHER): Payer: Medicare Other | Admitting: *Deleted

## 2013-07-25 DIAGNOSIS — I482 Chronic atrial fibrillation, unspecified: Secondary | ICD-10-CM

## 2013-07-25 DIAGNOSIS — Z5181 Encounter for therapeutic drug level monitoring: Secondary | ICD-10-CM

## 2013-07-25 DIAGNOSIS — I4891 Unspecified atrial fibrillation: Secondary | ICD-10-CM

## 2013-07-25 DIAGNOSIS — Z7901 Long term (current) use of anticoagulants: Secondary | ICD-10-CM

## 2013-07-25 LAB — POCT INR: INR: 2.3

## 2013-08-07 ENCOUNTER — Telehealth: Payer: Self-pay | Admitting: Internal Medicine

## 2013-08-07 MED ORDER — SPIRONOLACTONE 25 MG PO TABS: 25.0000 mg | ORAL_TABLET | Freq: Every day | ORAL | Status: DC

## 2013-08-07 NOTE — Telephone Encounter (Signed)
Received fax refill request  Rx # [id number] Medication:  Spironolactone 25 mg tablet Qty 90 Sig:  Take one tablet by mouth once daily Physician:  Lovena Le

## 2013-08-21 ENCOUNTER — Telehealth: Payer: Self-pay | Admitting: Internal Medicine

## 2013-08-21 MED ORDER — WARFARIN SODIUM 5 MG PO TABS: ORAL_TABLET | ORAL | Status: DC

## 2013-08-21 NOTE — Telephone Encounter (Signed)
Received fax refill request  Rx # [id number]  Medication:  Warfarin Sodium 5 mg tab Qty 45 Sig:  Take 1-2 tablets by mouth daily or as directed by coumadin clinic Physician:  Lovena Le

## 2013-08-22 ENCOUNTER — Ambulatory Visit (INDEPENDENT_AMBULATORY_CARE_PROVIDER_SITE_OTHER): Payer: Medicare Other | Admitting: *Deleted

## 2013-08-22 DIAGNOSIS — I4891 Unspecified atrial fibrillation: Secondary | ICD-10-CM

## 2013-08-22 DIAGNOSIS — Z7901 Long term (current) use of anticoagulants: Secondary | ICD-10-CM

## 2013-08-22 DIAGNOSIS — Z5181 Encounter for therapeutic drug level monitoring: Secondary | ICD-10-CM

## 2013-08-22 LAB — POCT INR: INR: 1.4

## 2013-08-29 ENCOUNTER — Ambulatory Visit (INDEPENDENT_AMBULATORY_CARE_PROVIDER_SITE_OTHER): Payer: Medicare Other | Admitting: *Deleted

## 2013-08-29 DIAGNOSIS — Z7901 Long term (current) use of anticoagulants: Secondary | ICD-10-CM

## 2013-08-29 DIAGNOSIS — Z5181 Encounter for therapeutic drug level monitoring: Secondary | ICD-10-CM

## 2013-08-29 DIAGNOSIS — I4891 Unspecified atrial fibrillation: Secondary | ICD-10-CM

## 2013-08-29 LAB — POCT INR: INR: 2

## 2013-08-30 ENCOUNTER — Encounter: Payer: Self-pay | Admitting: Internal Medicine

## 2013-09-11 ENCOUNTER — Telehealth: Payer: Self-pay | Admitting: Internal Medicine

## 2013-09-11 MED ORDER — AMIODARONE HCL 200 MG PO TABS: 200.0000 mg | ORAL_TABLET | ORAL | Status: DC

## 2013-09-11 NOTE — Telephone Encounter (Signed)
Received fax refill request  Rx # [id number] Medication:  Amiodarone HCL 200 mg tab Qty 90 Sig:  Take one tablet by mouth once daily Monday through Friday and 1/2 tablet Saturday and Sunday Physician:  Lovena Le

## 2013-09-12 ENCOUNTER — Ambulatory Visit (INDEPENDENT_AMBULATORY_CARE_PROVIDER_SITE_OTHER): Payer: Medicare Other | Admitting: *Deleted

## 2013-09-12 DIAGNOSIS — I4891 Unspecified atrial fibrillation: Secondary | ICD-10-CM

## 2013-09-12 DIAGNOSIS — Z5181 Encounter for therapeutic drug level monitoring: Secondary | ICD-10-CM

## 2013-09-12 DIAGNOSIS — Z7901 Long term (current) use of anticoagulants: Secondary | ICD-10-CM

## 2013-09-12 LAB — POCT INR: INR: 2.2

## 2013-09-13 ENCOUNTER — Encounter: Payer: Self-pay | Admitting: Internal Medicine

## 2013-09-13 ENCOUNTER — Ambulatory Visit (INDEPENDENT_AMBULATORY_CARE_PROVIDER_SITE_OTHER): Payer: Medicare Other | Admitting: Internal Medicine

## 2013-09-13 VITALS — BP 110/64 | HR 64 | Ht 68.0 in | Wt 198.0 lb

## 2013-09-13 DIAGNOSIS — I4891 Unspecified atrial fibrillation: Secondary | ICD-10-CM

## 2013-09-13 DIAGNOSIS — I472 Ventricular tachycardia: Secondary | ICD-10-CM

## 2013-09-13 DIAGNOSIS — I480 Paroxysmal atrial fibrillation: Secondary | ICD-10-CM

## 2013-09-13 DIAGNOSIS — I4729 Other ventricular tachycardia: Secondary | ICD-10-CM

## 2013-09-13 DIAGNOSIS — Z95810 Presence of automatic (implantable) cardiac defibrillator: Secondary | ICD-10-CM

## 2013-09-13 DIAGNOSIS — I428 Other cardiomyopathies: Secondary | ICD-10-CM

## 2013-09-13 LAB — MDC_IDC_ENUM_SESS_TYPE_INCLINIC
Battery Voltage: 2.83 V
Brady Statistic AP VP Percent: 95.04 %
Brady Statistic AS VS Percent: 1.97 %
Brady Statistic RA Percent Paced: 96.07 %
HIGH POWER IMPEDANCE MEASURED VALUE: 209 Ohm
HIGH POWER IMPEDANCE MEASURED VALUE: 63 Ohm
HighPow Impedance: 380 Ohm
HighPow Impedance: 50 Ohm
Lead Channel Impedance Value: 437 Ohm
Lead Channel Impedance Value: 437 Ohm
Lead Channel Impedance Value: 513 Ohm
Lead Channel Pacing Threshold Amplitude: 0.75 V
Lead Channel Pacing Threshold Amplitude: 1.5 V
Lead Channel Pacing Threshold Pulse Width: 0.4 ms
Lead Channel Pacing Threshold Pulse Width: 0.4 ms
Lead Channel Sensing Intrinsic Amplitude: 1.625 mV
Lead Channel Sensing Intrinsic Amplitude: 12.25 mV
Lead Channel Sensing Intrinsic Amplitude: 14.625 mV
Lead Channel Sensing Intrinsic Amplitude: 2.125 mV
Lead Channel Setting Pacing Amplitude: 2.5 V
Lead Channel Setting Pacing Amplitude: 2.75 V
Lead Channel Setting Pacing Pulse Width: 0.4 ms
Lead Channel Setting Pacing Pulse Width: 0.4 ms
MDC IDC MSMT LEADCHNL LV IMPEDANCE VALUE: 399 Ohm
MDC IDC MSMT LEADCHNL LV IMPEDANCE VALUE: 665 Ohm
MDC IDC MSMT LEADCHNL RA PACING THRESHOLD AMPLITUDE: 1.5 V
MDC IDC MSMT LEADCHNL RA PACING THRESHOLD PULSEWIDTH: 0.8 ms
MDC IDC SESS DTM: 20150821110341
MDC IDC SET LEADCHNL RV PACING AMPLITUDE: 2.5 V
MDC IDC SET LEADCHNL RV SENSING SENSITIVITY: 0.3 mV
MDC IDC SET ZONE DETECTION INTERVAL: 350 ms
MDC IDC STAT BRADY AP VS PERCENT: 1.03 %
MDC IDC STAT BRADY AS VP PERCENT: 1.96 %
MDC IDC STAT BRADY RV PERCENT PACED: 97 %
Zone Setting Detection Interval: 290 ms
Zone Setting Detection Interval: 350 ms
Zone Setting Detection Interval: 400 ms

## 2013-09-13 NOTE — Patient Instructions (Signed)
Your physician wants you to follow-up in: 1 year with Dr. Lovena Le. You will receive a reminder letter in the mail two months in advance. If you don't receive a letter, please call our office to schedule the follow-up appointment.  Remote monitoring is used to monitor your Pacemaker of ICD from home. This monitoring reduces the number of office visits required to check your device to one time per year. It allows [place] to keep an eye on the functioning of your device to ensure it is working properly. You are scheduled for a device check from home on November 23rd. You may send your transmission at any time that day. If you have a wireless device, the transmission will be sent automatically. After your physician reviews your transmission, you will receive a postcard with your next transmission date.  Your physician recommends that you continue on your current medications as directed. Please refer to the Current Medication list given to you today.  Thank you for choosing [facility]!!

## 2013-09-15 ENCOUNTER — Encounter: Payer: Self-pay | Admitting: Internal Medicine

## 2013-09-15 DIAGNOSIS — I472 Ventricular tachycardia, unspecified: Secondary | ICD-10-CM | POA: Insufficient documentation

## 2013-09-15 DIAGNOSIS — I4729 Other ventricular tachycardia: Secondary | ICD-10-CM | POA: Insufficient documentation

## 2013-09-15 NOTE — Assessment & Plan Note (Signed)
Her Medtronic BiV ICD is working normally. Will recheck in several months.

## 2013-09-15 NOTE — Assessment & Plan Note (Signed)
She has had no VT on low dose amio. Will follow.

## 2013-09-15 NOTE — Progress Notes (Signed)
HPI [name] returns today for followup. She is a very pleasant 73 yo woman with a non-ischemic cardiomyopathy, chronic systolic heart failure, left bundle branch block, status post biventricular ICD implantation. She also is a history of ventricular tachycardia and paroxysmal atrial fibrillation. Both arrhythmias have been well controlled on low-dose amiodarone. She denies syncope. She admits to being fairly sedentary she is going to try and increase her physical activity. She notes that she stopped smoking. Allergies  Allergen Reactions  . Penicillins Other (See Comments)    Stiffness     Current Outpatient Prescriptions  Medication Sig Dispense Refill  . acetaminophen (TYLENOL) 500 MG tablet Take 500 mg by mouth as needed. For pain      . allopurinol (ZYLOPRIM) 100 MG tablet Take 100 mg by mouth daily.       Marland Kitchen amiodarone (PACERONE) 200 MG tablet Take 1 tablet (200 mg total) by mouth as directed. 1/2 tablet Monday through Friday and 1 tablet sat and sun  30 tablet  0  . buPROPion (WELLBUTRIN SR) 100 MG 12 hr tablet Take 1 tablet (100 mg total) by mouth daily.  90 tablet  0  . carvedilol (COREG) 25 MG tablet Take 1 tablet (25 mg total) by mouth 2 (two) times daily with a meal.  180 tablet  3  . Chlorpheniramine Maleate (ALLERGY RELIEF PO) Take by mouth.      . digoxin (LANOXIN) 0.125 MG tablet Take 1 tablet (125 mcg total) by mouth every other day.  30 tablet  6  . enalapril (VASOTEC) 10 MG tablet Take 10 mg by mouth daily.       Marland Kitchen LASIX 40 MG tablet TAKE 1 TABLET BY MOUTH   TWICE A DAY.  60 each  0  . spironolactone (ALDACTONE) 25 MG tablet Take 1 tablet (25 mg total) by mouth daily.  30 tablet  6  . Vitamin D, Ergocalciferol, (DRISDOL) 50000 UNITS CAPS Take 50,000 Units by mouth every 7 (seven) days.      Marland Kitchen warfarin (COUMADIN) 5 MG tablet Take 1 tablet daily except 1/2 tablet on Tuesdays and Fridays or as directed.  45 tablet  3   No current facility-administered medications for this  visit.     Past Medical History  Diagnosis Date  . Nonischemic cardiomyopathy 1998    EF of 25% in 1998, 15% in 2006; catheterization in 3/07-50% D1; luminal      irregularities in the other vessels; PA-65/25. 20% EF in 04/2007 with moderate MR and pulmonary htn; AICD/biventricular pacing--05/2005  . Hypertension   . Atrial fibrillation     plus nonsustained ventricular tachycardia --> amiodarone treatment  . Nonsustained ventricular tachycardia   . OSA (obstructive sleep apnea)     no sleep machine  . Chronic kidney disease, stage 4, severely decreased GFR     Creatinine of 2.1 in 09/2008, 1.9 in 11/1998 and, 2.3 in 05/2010  . Venous stasis   . Gout     Uric acid of 12.5 in 03/2009  . Chronic anticoagulation   . History of lupus   . CHF (congestive heart failure)   . Chronic kidney disease     ROS:   All systems reviewed and negative except as noted in the HPI.   Past Surgical History  Procedure Laterality Date  . Appendectomy  1987  . Incision and drainage intra oral abscess  1991    submandibular abscess  . Cardiac defibrillator placement  05/2005    Medtronic Insync, Biventricular  .  Colonoscopy with esophagogastroduodenoscopy (egd) N/A 03/20/2013    Procedure: COLONOSCOPY WITH ESOPHAGOGASTRODUODENOSCOPY (EGD);  Surgeon: Daneil Dolin, MD;  Location: [facility];  Service: Endoscopy;  Laterality: N/A;  9:30-moved to [place] notified pt     Family History  Problem Relation Age of Onset  . Heart attack Mother 5  . Kidney failure Father 8  . Heart attack Brother   . Colon cancer Neg Hx      History   Social History  . Marital Status: Single    Spouse Name: N/A    Number of Children:  0  . Years of Education: N/A   Occupational History  . Retired     Environmental manager work; Programmer, applications   Social History Main Topics  . Smoking status: Current Every Day Smoker -- 0.50 packs/day for 30 years    Types: Cigarettes  . Smokeless tobacco: Never Used      Comment: pt states that she is trying to quit  . Alcohol Use: No  . Drug Use: No  . Sexual Activity: Not on file   Other Topics Concern  . Not on file   Social History Narrative   Resides with a niece in [place]     BP 110/64  Pulse 64  Ht 5\' 8"  (1.727 m)  Wt 198 lb (89.812 kg)  BMI 30.11 kg/m2  Physical Exam:  Well appearing 73 year old woman, NAD HEENT: Unremarkable Neck:  7 cm JVD, no thyromegally Back:  No CVA tenderness Lungs:  Clear with no wheezes, rales, or rhonchi. HEART:  Regular rate rhythm, no murmurs, no rubs, no clicks Abd:  soft, positive bowel sounds, no organomegally, no rebound, no guarding Ext:  2 plus pulses, no edema, no cyanosis, no clubbing Skin:  No rashes no nodules Neuro:  CN II through XII intact, motor grossly intact   DEVICE  Normal device function.  See PaceArt for details.   Assess/Plan:

## 2013-09-15 NOTE — Assessment & Plan Note (Signed)
She is maintaining NSR very nicely on low dose amiodarone. She will continue warfarin.

## 2013-10-01 ENCOUNTER — Encounter: Payer: Self-pay | Admitting: Internal Medicine

## 2013-10-03 ENCOUNTER — Encounter: Payer: Self-pay | Admitting: Cardiology

## 2013-10-07 ENCOUNTER — Encounter: Payer: Self-pay | Admitting: Gastroenterology

## 2013-10-07 ENCOUNTER — Ambulatory Visit: Payer: Medicare Other | Admitting: Gastroenterology

## 2013-10-08 ENCOUNTER — Encounter: Payer: Self-pay | Admitting: Cardiology

## 2013-10-10 ENCOUNTER — Telehealth: Payer: Self-pay | Admitting: Internal Medicine

## 2013-10-10 ENCOUNTER — Ambulatory Visit (INDEPENDENT_AMBULATORY_CARE_PROVIDER_SITE_OTHER): Payer: Medicare Other | Admitting: *Deleted

## 2013-10-10 DIAGNOSIS — Z7901 Long term (current) use of anticoagulants: Secondary | ICD-10-CM

## 2013-10-10 DIAGNOSIS — Z5181 Encounter for therapeutic drug level monitoring: Secondary | ICD-10-CM

## 2013-10-10 DIAGNOSIS — I4891 Unspecified atrial fibrillation: Secondary | ICD-10-CM

## 2013-10-10 LAB — POCT INR: INR: 2.1

## 2013-10-10 NOTE — Telephone Encounter (Signed)
Received fax refill request  Rx # [id number] Medication:  Digoxin 125 mcg tablets Qty 30  Sig:  Take one tablet by mouth every day Physician:  Lovena Le

## 2013-11-06 ENCOUNTER — Ambulatory Visit (INDEPENDENT_AMBULATORY_CARE_PROVIDER_SITE_OTHER): Payer: Medicare Other | Admitting: *Deleted

## 2013-11-06 DIAGNOSIS — Z5181 Encounter for therapeutic drug level monitoring: Secondary | ICD-10-CM

## 2013-11-06 DIAGNOSIS — Z7901 Long term (current) use of anticoagulants: Secondary | ICD-10-CM

## 2013-11-06 DIAGNOSIS — I4891 Unspecified atrial fibrillation: Secondary | ICD-10-CM

## 2013-11-06 LAB — POCT INR: INR: 2.2

## 2013-11-11 ENCOUNTER — Other Ambulatory Visit: Payer: Self-pay

## 2013-11-11 ENCOUNTER — Ambulatory Visit (INDEPENDENT_AMBULATORY_CARE_PROVIDER_SITE_OTHER): Payer: Medicare Other | Admitting: Gastroenterology

## 2013-11-11 ENCOUNTER — Encounter: Payer: Self-pay | Admitting: Gastroenterology

## 2013-11-11 VITALS — BP 122/79 | HR 84 | Temp 97.4°F | Resp 18 | Ht 68.0 in | Wt 202.0 lb

## 2013-11-11 DIAGNOSIS — Z7901 Long term (current) use of anticoagulants: Secondary | ICD-10-CM

## 2013-11-11 DIAGNOSIS — D126 Benign neoplasm of colon, unspecified: Secondary | ICD-10-CM | POA: Insufficient documentation

## 2013-11-11 DIAGNOSIS — D369 Benign neoplasm, unspecified site: Secondary | ICD-10-CM

## 2013-11-11 DIAGNOSIS — D509 Iron deficiency anemia, unspecified: Secondary | ICD-10-CM

## 2013-11-11 DIAGNOSIS — K635 Polyp of colon: Secondary | ICD-10-CM

## 2013-11-11 MED ORDER — PEG-KCL-NACL-NASULF-NA ASC-C 100 G PO SOLR: 1.0000 | ORAL | Status: DC

## 2013-11-11 NOTE — Assessment & Plan Note (Signed)
73 year old lady with history of iron deficiency anemia/anemia of chronic disease/heme positive stool who recently had colonoscopy which revealed a 1.5 x 2 cm carpet polyp adjacent to the appendiceal orifice and a 1 x 3 cm sprawling carpet polyp just distal to the ICV. Pathology revealed tubulovillous adenomas. She was recommended to come back in 6 months for repeat colonoscopy. Colonoscopy in the near future.  I have discussed the risks, alternatives, benefits with regards to but not limited to the risk of reaction to medication, bleeding, infection, perforation and the patient is agreeable to proceed. Written consent to be obtained. I have requested most recent copies of labs from Dr. Hinda Lenis. Per patient her anemia is improved.  Patient has a defibrillator, endo with be made aware.  Hold her Coumadin 4 days prior to procedure as we did previously.

## 2013-11-11 NOTE — Progress Notes (Signed)
Primary Care Physician: Delphina Cahill, MD  Primary Gastroenterologist:  Garfield Cornea, MD   Chief Complaint  Patient presents with  . Follow-up    HPI: [name] [name] is a 73 y.o. female here for six-month followup. She has a history of iron deficiency anemia/anemia chronic disease/heme positive stool in the setting of chronic Coumadin. Underwent an EGD and colonoscopy in February 2015, she had gastric erosions and nodularity. H. pylori gastritis on biopsy. She had 2 rather large carpet polyps at ileocecal valve and cecum which were tubular villous adenomas. Also tubular adenoma in the rectum removed. She was advised to have a repeat colonoscopy in 6 months.  BM every other day. No melena, brbpr. No abdominal pain. No heartburn, vomiting, dysphagia. No further iron infusions needed. Reports last blood work better with regards to iron done back in 08/2013.   Current Outpatient Prescriptions  Medication Sig Dispense Refill  . acetaminophen (TYLENOL) 500 MG tablet Take 500 mg by mouth as needed. For pain      . allopurinol (ZYLOPRIM) 100 MG tablet Take 100 mg by mouth daily.       Marland Kitchen amiodarone (PACERONE) 200 MG tablet Take 1 tablet (200 mg total) by mouth as directed. 1/2 tablet Monday through Friday and 1 tablet sat and sun  30 tablet  0  . buPROPion (WELLBUTRIN SR) 100 MG 12 hr tablet Take 1 tablet (100 mg total) by mouth daily.  90 tablet  0  . carvedilol (COREG) 25 MG tablet Take 1 tablet (25 mg total) by mouth 2 (two) times daily with a meal.  180 tablet  3  . Chlorpheniramine Maleate (ALLERGY RELIEF PO) Take by mouth.      . digoxin (LANOXIN) 0.125 MG tablet Take 1 tablet (125 mcg total) by mouth every other day.  30 tablet  6  . LASIX 40 MG tablet TAKE 1 TABLET BY MOUTH   TWICE A DAY.  60 each  0  . spironolactone (ALDACTONE) 25 MG tablet Take 1 tablet (25 mg total) by mouth daily.  30 tablet  6  . Vitamin D, Ergocalciferol, (DRISDOL) 50000 UNITS CAPS Take 50,000 Units by mouth  every 7 (seven) days.      Marland Kitchen warfarin (COUMADIN) 5 MG tablet Take 1 tablet daily except 1/2 tablet on Tuesdays and Fridays or as directed.  45 tablet  3   No current facility-administered medications for this visit.    Allergies as of 11/11/2013 - Review Complete 11/11/2013  Allergen Reaction Noted  . Penicillins Other (See Comments)    Past Medical History  Diagnosis Date  . Nonischemic cardiomyopathy 1998    EF of 25% in 1998, 15% in 2006; catheterization in 3/07-50% D1; luminal      irregularities in the other vessels; PA-65/25. 20% EF in 04/2007 with moderate MR and pulmonary htn; AICD/biventricular pacing--05/2005  . Hypertension   . Atrial fibrillation     plus nonsustained ventricular tachycardia --> amiodarone treatment  . Nonsustained ventricular tachycardia   . OSA (obstructive sleep apnea)     no sleep machine  . Chronic kidney disease, stage 4, severely decreased GFR     Creatinine of 2.1 in 09/2008, 1.9 in 11/1998 and, 2.3 in 05/2010  . Venous stasis   . Gout     Uric acid of 12.5 in 03/2009  . Chronic anticoagulation   . History of lupus   . CHF (congestive heart failure)   . Chronic kidney disease  Past Surgical History  Procedure Laterality Date  . Appendectomy  1987  . Incision and drainage intra oral abscess  1991    submandibular abscess  . Cardiac defibrillator placement  05/2005    Medtronic Insync, Biventricular  . Colonoscopy with esophagogastroduodenoscopy (egd) N/A 03/20/2013    RMR: Gastric erosions and nodularity as described above s/p bx s/p hemostasis cliping/Rectal polyp-removed as described above. Polyp at ileocecal valve and cecum requiring saline/epinephrine injection assistance piecemeal polypectomy with subsequent APC ablation and clip. Stomach biopsy showed mild gastritis with H. pylori. 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 Rectum TA   Family History  Problem Relation Age of Onset  . Heart attack Mother 25  . Kidney  failure Father 63  . Heart attack Brother   . Colon cancer Neg Hx    History   Social History  . Marital Status: Single    Spouse Name: N/A    Number of Children:  0  . Years of Education: N/A   Occupational History  . Retired     Environmental manager work; Programmer, applications   Social History Main Topics  . Smoking status: Current Every Day Smoker -- 0.50 packs/day for 30 years    Types: Cigarettes  . Smokeless tobacco: Never Used     Comment: pt states that she is trying to quit  . Alcohol Use: No  . Drug Use: No  . Sexual Activity: None   Other Topics Concern  . None   Social History Narrative   Resides with a niece in [place]:  General: Negative for anorexia, weight loss, fever, chills, fatigue, weakness. ENT: Negative for hoarseness, difficulty swallowing , nasal congestion. CV: Negative for chest pain, angina, palpitations, dyspnea on exertion, peripheral edema.  Respiratory: Negative for dyspnea at rest, dyspnea on exertion, cough, sputum, wheezing.  GI: See history of present illness. GU:  Negative for dysuria, hematuria, urinary incontinence, urinary frequency, nocturnal urination.  Endo: Negative for unusual weight change.    Physical Examination:   BP 122/79  Pulse 84  Temp(Src) 97.4 F (36.3 C) (Oral)  Resp 18  Ht 5\' 8"  (1.727 m)  Wt 202 lb (91.627 kg)  BMI 30.72 kg/m2  General: Well-nourished, well-developed in no acute distress.  Eyes: No icterus. Mouth: Oropharyngeal mucosa moist and pink , no lesions erythema or exudate. Lungs: Clear to auscultation bilaterally.  Heart: Regular rate and rhythm, no murmurs rubs or gallops.  Abdomen: Bowel sounds are normal, nontender, nondistended, no hepatosplenomegaly or masses, no abdominal bruits or hernia , no rebound or guarding.   Extremities: No lower extremity edema. No clubbing or deformities. Neuro: Alert and oriented x 4   Skin: Warm and dry, no jaundice.   Psych: Alert and cooperative, normal mood and  affect.

## 2013-11-11 NOTE — Patient Instructions (Signed)
1. Colonoscopy with Dr. Gala Romney as scheduled. Please see separate instructions. 2. Hold Coumadin 4 days prior to procedure. Further instructions regarding resuming Coumadin will be provided after your procedure.

## 2013-11-15 ENCOUNTER — Other Ambulatory Visit: Payer: Self-pay | Admitting: Internal Medicine

## 2013-11-20 NOTE — Progress Notes (Signed)
cc'ed to pcp °

## 2013-12-04 ENCOUNTER — Ambulatory Visit (INDEPENDENT_AMBULATORY_CARE_PROVIDER_SITE_OTHER): Payer: Medicare Other | Admitting: *Deleted

## 2013-12-04 DIAGNOSIS — I4891 Unspecified atrial fibrillation: Secondary | ICD-10-CM

## 2013-12-04 DIAGNOSIS — Z7901 Long term (current) use of anticoagulants: Secondary | ICD-10-CM

## 2013-12-04 DIAGNOSIS — Z5181 Encounter for therapeutic drug level monitoring: Secondary | ICD-10-CM

## 2013-12-04 LAB — POCT INR: INR: 5

## 2013-12-04 NOTE — Progress Notes (Signed)
Labs from 08/2013: Dr. Lowanda Foster BUN 32, creatinine 1.9, iron 16 low, TIBC 379, iron saturations 4% low, G90 301, folic acid 11, ferritin 11 low, hemoglobin 10.6 low, hematocrit 34.7.  In 10/2012 (one year ago): ferritin 22, iron 69, hgb 10.8  Await colonoscopy findings.  Repeat CBC, iron/tibc, ferritin next month.

## 2013-12-06 ENCOUNTER — Other Ambulatory Visit: Payer: Self-pay | Admitting: Gastroenterology

## 2013-12-06 DIAGNOSIS — D509 Iron deficiency anemia, unspecified: Secondary | ICD-10-CM

## 2013-12-06 NOTE — Progress Notes (Signed)
Lab order on file. 

## 2013-12-09 ENCOUNTER — Encounter (HOSPITAL_COMMUNITY): Payer: Self-pay | Admitting: *Deleted

## 2013-12-09 ENCOUNTER — Encounter (HOSPITAL_COMMUNITY)
Admission: RE | Disposition: A | Discharge status: Home / Self Care | Payer: Self-pay | Source: Ambulatory Visit | Attending: Internal Medicine

## 2013-12-09 ENCOUNTER — Ambulatory Visit (HOSPITAL_COMMUNITY)
Admission: RE | Admit: 2013-12-09 | Discharge: 2013-12-09 | Disposition: A | Discharge status: Home / Self Care | Payer: Medicare Other | Source: Ambulatory Visit | Attending: Internal Medicine | Admitting: Internal Medicine

## 2013-12-09 DIAGNOSIS — D369 Benign neoplasm, unspecified site: Secondary | ICD-10-CM

## 2013-12-09 DIAGNOSIS — D509 Iron deficiency anemia, unspecified: Secondary | ICD-10-CM

## 2013-12-09 DIAGNOSIS — I129 Hypertensive chronic kidney disease with stage 1 through stage 4 chronic kidney disease, or unspecified chronic kidney disease: Secondary | ICD-10-CM | POA: Diagnosis not present

## 2013-12-09 DIAGNOSIS — F17210 Nicotine dependence, cigarettes, uncomplicated: Secondary | ICD-10-CM | POA: Diagnosis not present

## 2013-12-09 DIAGNOSIS — D122 Benign neoplasm of ascending colon: Secondary | ICD-10-CM

## 2013-12-09 DIAGNOSIS — N184 Chronic kidney disease, stage 4 (severe): Secondary | ICD-10-CM | POA: Insufficient documentation

## 2013-12-09 DIAGNOSIS — Z1211 Encounter for screening for malignant neoplasm of colon: Secondary | ICD-10-CM | POA: Diagnosis not present

## 2013-12-09 DIAGNOSIS — I4891 Unspecified atrial fibrillation: Secondary | ICD-10-CM | POA: Insufficient documentation

## 2013-12-09 DIAGNOSIS — Z7901 Long term (current) use of anticoagulants: Secondary | ICD-10-CM | POA: Diagnosis not present

## 2013-12-09 DIAGNOSIS — D124 Benign neoplasm of descending colon: Secondary | ICD-10-CM | POA: Diagnosis not present

## 2013-12-09 DIAGNOSIS — Z860100 Personal history of colon polyps, unspecified: Secondary | ICD-10-CM | POA: Insufficient documentation

## 2013-12-09 DIAGNOSIS — Z79899 Other long term (current) drug therapy: Secondary | ICD-10-CM | POA: Diagnosis not present

## 2013-12-09 DIAGNOSIS — D125 Benign neoplasm of sigmoid colon: Secondary | ICD-10-CM

## 2013-12-09 DIAGNOSIS — G4733 Obstructive sleep apnea (adult) (pediatric): Secondary | ICD-10-CM | POA: Diagnosis not present

## 2013-12-09 HISTORY — PX: COLONOSCOPY: SHX5424

## 2013-12-09 SURGERY — COLONOSCOPY
Anesthesia: Moderate Sedation

## 2013-12-09 MED ORDER — WARFARIN SODIUM 5 MG PO TABS: ORAL_TABLET | ORAL | Status: DC

## 2013-12-09 MED ORDER — ONDANSETRON HCL 4 MG/2ML IJ SOLN
INTRAMUSCULAR | Status: DC | PRN
  Administered 2013-12-09: 4 mg via INTRAVENOUS

## 2013-12-09 MED ORDER — MIDAZOLAM HCL 5 MG/5ML IJ SOLN
INTRAMUSCULAR | Status: DC | PRN
  Administered 2013-12-09 (×2): 1 mg via INTRAVENOUS
  Administered 2013-12-09: 2 mg via INTRAVENOUS
  Administered 2013-12-09: 1 mg via INTRAVENOUS

## 2013-12-09 MED ORDER — ONDANSETRON HCL 4 MG/2ML IJ SOLN
INTRAMUSCULAR | Status: AC
  Filled 2013-12-09: qty 2

## 2013-12-09 MED ORDER — MEPERIDINE HCL 100 MG/ML IJ SOLN
INTRAMUSCULAR | Status: AC
  Filled 2013-12-09: qty 2

## 2013-12-09 MED ORDER — STERILE WATER FOR IRRIGATION IR SOLN
Status: DC | PRN
  Administered 2013-12-09 (×2)

## 2013-12-09 MED ORDER — SODIUM CHLORIDE 0.9 % IV SOLN
INTRAVENOUS | Status: DC
  Administered 2013-12-09: 08:00:00 via INTRAVENOUS

## 2013-12-09 MED ORDER — MIDAZOLAM HCL 5 MG/5ML IJ SOLN
INTRAMUSCULAR | Status: AC
  Filled 2013-12-09: qty 10

## 2013-12-09 MED ORDER — MEPERIDINE HCL 100 MG/ML IJ SOLN
INTRAMUSCULAR | Status: DC | PRN
  Administered 2013-12-09: 50 mg via INTRAVENOUS
  Administered 2013-12-09: 25 mg via INTRAVENOUS

## 2013-12-09 NOTE — H&P (View-Only) (Signed)
Primary Care Physician: Delphina Cahill, MD  Primary Gastroenterologist:  Garfield Cornea, MD   Chief Complaint  Patient presents with  . Follow-up    HPI: [name] [name] is a 73 y.o. female here for six-month followup. She has a history of iron deficiency anemia/anemia chronic disease/heme positive stool in the setting of chronic Coumadin. Underwent an EGD and colonoscopy in February 2015, she had gastric erosions and nodularity. H. pylori gastritis on biopsy. She had 2 rather large carpet polyps at ileocecal valve and cecum which were tubular villous adenomas. Also tubular adenoma in the rectum removed. She was advised to have a repeat colonoscopy in 6 months.  BM every other day. No melena, brbpr. No abdominal pain. No heartburn, vomiting, dysphagia. No further iron infusions needed. Reports last blood work better with regards to iron done back in 08/2013.   Current Outpatient Prescriptions  Medication Sig Dispense Refill  . acetaminophen (TYLENOL) 500 MG tablet Take 500 mg by mouth as needed. For pain      . allopurinol (ZYLOPRIM) 100 MG tablet Take 100 mg by mouth daily.       Marland Kitchen amiodarone (PACERONE) 200 MG tablet Take 1 tablet (200 mg total) by mouth as directed. 1/2 tablet Monday through Friday and 1 tablet sat and sun  30 tablet  0  . buPROPion (WELLBUTRIN SR) 100 MG 12 hr tablet Take 1 tablet (100 mg total) by mouth daily.  90 tablet  0  . carvedilol (COREG) 25 MG tablet Take 1 tablet (25 mg total) by mouth 2 (two) times daily with a meal.  180 tablet  3  . Chlorpheniramine Maleate (ALLERGY RELIEF PO) Take by mouth.      . digoxin (LANOXIN) 0.125 MG tablet Take 1 tablet (125 mcg total) by mouth every other day.  30 tablet  6  . LASIX 40 MG tablet TAKE 1 TABLET BY MOUTH   TWICE A DAY.  60 each  0  . spironolactone (ALDACTONE) 25 MG tablet Take 1 tablet (25 mg total) by mouth daily.  30 tablet  6  . Vitamin D, Ergocalciferol, (DRISDOL) 50000 UNITS CAPS Take 50,000 Units by mouth  every 7 (seven) days.      Marland Kitchen warfarin (COUMADIN) 5 MG tablet Take 1 tablet daily except 1/2 tablet on Tuesdays and Fridays or as directed.  45 tablet  3   No current facility-administered medications for this visit.    Allergies as of 11/11/2013 - Review Complete 11/11/2013  Allergen Reaction Noted  . Penicillins Other (See Comments)    Past Medical History  Diagnosis Date  . Nonischemic cardiomyopathy 1998    EF of 25% in 1998, 15% in 2006; catheterization in 3/07-50% D1; luminal      irregularities in the other vessels; PA-65/25. 20% EF in 04/2007 with moderate MR and pulmonary htn; AICD/biventricular pacing--05/2005  . Hypertension   . Atrial fibrillation     plus nonsustained ventricular tachycardia --> amiodarone treatment  . Nonsustained ventricular tachycardia   . OSA (obstructive sleep apnea)     no sleep machine  . Chronic kidney disease, stage 4, severely decreased GFR     Creatinine of 2.1 in 09/2008, 1.9 in 11/1998 and, 2.3 in 05/2010  . Venous stasis   . Gout     Uric acid of 12.5 in 03/2009  . Chronic anticoagulation   . History of lupus   . CHF (congestive heart failure)   . Chronic kidney disease  Past Surgical History  Procedure Laterality Date  . Appendectomy  1987  . Incision and drainage intra oral abscess  1991    submandibular abscess  . Cardiac defibrillator placement  05/2005    Medtronic Insync, Biventricular  . Colonoscopy with esophagogastroduodenoscopy (egd) N/A 03/20/2013    RMR: Gastric erosions and nodularity as described above s/p bx s/p hemostasis cliping/Rectal polyp-removed as described above. Polyp at ileocecal valve and cecum requiring saline/epinephrine injection assistance piecemeal polypectomy with subsequent APC ablation and clip. Stomach biopsy showed mild gastritis with H. pylori. 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 Rectum TA   Family History  Problem Relation Age of Onset  . Heart attack Mother 9  . Kidney  failure Father 85  . Heart attack Brother   . Colon cancer Neg Hx    History   Social History  . Marital Status: Single    Spouse Name: N/A    Number of Children:  0  . Years of Education: N/A   Occupational History  . Retired     Environmental manager work; Programmer, applications   Social History Main Topics  . Smoking status: Current Every Day Smoker -- 0.50 packs/day for 30 years    Types: Cigarettes  . Smokeless tobacco: Never Used     Comment: pt states that she is trying to quit  . Alcohol Use: No  . Drug Use: No  . Sexual Activity: None   Other Topics Concern  . None   Social History Narrative   Resides with a niece in [place]:  General: Negative for anorexia, weight loss, fever, chills, fatigue, weakness. ENT: Negative for hoarseness, difficulty swallowing , nasal congestion. CV: Negative for chest pain, angina, palpitations, dyspnea on exertion, peripheral edema.  Respiratory: Negative for dyspnea at rest, dyspnea on exertion, cough, sputum, wheezing.  GI: See history of present illness. GU:  Negative for dysuria, hematuria, urinary incontinence, urinary frequency, nocturnal urination.  Endo: Negative for unusual weight change.    Physical Examination:   BP 122/79  Pulse 84  Temp(Src) 97.4 F (36.3 C) (Oral)  Resp 18  Ht 5\' 8"  (1.727 m)  Wt 202 lb (91.627 kg)  BMI 30.72 kg/m2  General: Well-nourished, well-developed in no acute distress.  Eyes: No icterus. Mouth: Oropharyngeal mucosa moist and pink , no lesions erythema or exudate. Lungs: Clear to auscultation bilaterally.  Heart: Regular rate and rhythm, no murmurs rubs or gallops.  Abdomen: Bowel sounds are normal, nontender, nondistended, no hepatosplenomegaly or masses, no abdominal bruits or hernia , no rebound or guarding.   Extremities: No lower extremity edema. No clubbing or deformities. Neuro: Alert and oriented x 4   Skin: Warm and dry, no jaundice.   Psych: Alert and cooperative, normal mood and  affect.

## 2013-12-09 NOTE — Discharge Instructions (Signed)
Colonoscopy Discharge Instructions  Read the instructions outlined below and refer to this sheet in the next few weeks. These discharge instructions provide you with general information on caring for yourself after you leave the hospital. Your doctor may also give you specific instructions. While your treatment has been planned according to the most current medical practices available, unavoidable complications occasionally occur. If you have any problems or questions after discharge, call Dr. Gala Romney at [phone]. ACTIVITY  You may resume your regular activity, but move at a slower pace for the next 24 hours.   Take frequent rest periods for the next 24 hours.   Walking will help get rid of the air and reduce the bloated feeling in your belly (abdomen).   No driving for 24 hours (because of the medicine (anesthesia) used during the test).    Do not sign any important legal documents or operate any machinery for 24 hours (because of the anesthesia used during the test).  NUTRITION  Drink plenty of fluids.   You may resume your normal diet as instructed by your doctor.   Begin with a light meal and progress to your normal diet. Heavy or fried foods are harder to digest and may make you feel sick to your stomach (nauseated).   Avoid alcoholic beverages for 24 hours or as instructed.  MEDICATIONS  You may resume your normal medications unless your doctor tells you otherwise.  WHAT YOU CAN EXPECT TODAY  Some feelings of bloating in the abdomen.   Passage of more gas than usual.   Spotting of blood in your stool or on the toilet paper.  IF YOU HAD POLYPS REMOVED DURING THE COLONOSCOPY:  No aspirin products for 7 days or as instructed.   No alcohol for 7 days or as instructed.   Eat a soft diet for the next 24 hours.  FINDING OUT THE RESULTS OF YOUR TEST Not all test results are available during your visit. If your test results are not back during the visit, make an appointment  with your caregiver to find out the results. Do not assume everything is normal if you have not heard from your caregiver or the medical facility. It is important for you to follow up on all of your test results.  SEEK IMMEDIATE MEDICAL ATTENTION IF:  You have more than a spotting of blood in your stool.   Your belly is swollen (abdominal distention).   You are nauseated or vomiting.   You have a temperature over 101.   You have abdominal pain or discomfort that is severe or gets worse throughout the day.    Polyp information provided  Resume Coumadin tomorrow, November 17 Further recommendations to follow pending review of pathology report  Colon Polyps Polyps are lumps of extra tissue growing inside the body. Polyps can grow in the large intestine (colon). Most colon polyps are noncancerous (benign). However, some colon polyps can become cancerous over time. Polyps that are larger than a pea may be harmful. To be safe, caregivers remove and test all polyps. CAUSES  Polyps form when mutations in the genes cause your cells to grow and divide even though no more tissue is needed. RISK FACTORS There are a number of risk factors that can increase your chances of getting colon polyps. They include: Being older than 50 years. Family history of colon polyps or colon cancer. Long-term colon diseases, such as colitis or Crohn disease. Being overweight. Smoking. Being inactive. Drinking too much alcohol. SYMPTOMS  Most  small polyps do not cause symptoms. If symptoms are present, they may include: Blood in the stool. The stool may look dark red or black. Constipation or diarrhea that lasts longer than 1 week. DIAGNOSIS People often do not know they have polyps until their caregiver finds them during a regular checkup. Your caregiver can use 4 tests to check for polyps: Digital rectal exam. The caregiver wears gloves and feels inside the rectum. This test would find polyps only in the  rectum. Barium enema. The caregiver puts a liquid called barium into your rectum before taking X-rays of your colon. Barium makes your colon look white. Polyps are dark, so they are easy to see in the X-ray pictures. Sigmoidoscopy. A thin, flexible tube (sigmoidoscope) is placed into your rectum. The sigmoidoscope has a light and tiny camera in it. The caregiver uses the sigmoidoscope to look at the last third of your colon. Colonoscopy. This test is like sigmoidoscopy, but the caregiver looks at the entire colon. This is the most common method for finding and removing polyps. TREATMENT  Any polyps will be removed during a sigmoidoscopy or colonoscopy. The polyps are then tested for cancer. PREVENTION  To help lower your risk of getting more colon polyps: Eat plenty of fruits and vegetables. Avoid eating fatty foods. Do not smoke. Avoid drinking alcohol. Exercise every day. Lose weight if recommended by your caregiver. Eat plenty of calcium and folate. Foods that are rich in calcium include milk, cheese, and broccoli. Foods that are rich in folate include chickpeas, kidney beans, and spinach. HOME CARE INSTRUCTIONS Keep all follow-up appointments as directed by your caregiver. You may need periodic exams to check for polyps. SEEK MEDICAL CARE IF: You notice bleeding during a bowel movement. Document Released: 10/07/2003 Document Revised: 04/04/2011 Document Reviewed: 03/22/2011 [facility] Patient Information 2015 [place], [place]. This information is not intended to replace advice given to you by your health care provider. Make sure you discuss any questions you have with your health care provider.

## 2013-12-09 NOTE — Interval H&P Note (Signed)
History and Physical Interval Note:  12/09/2013 8:18 AM  [name] [name]  has presented today for surgery, with the diagnosis of IDA/TUBULOVILLOUS ADENOMA  The various methods of treatment have been discussed with the patient and family. After consideration of risks, benefits and other options for treatment, the patient has consented to  Procedure(s) with comments: COLONOSCOPY (N/A) - 0730 as a surgical intervention .  The patient's history has been reviewed, patient examined, no change in status, stable for surgery.  I have reviewed the patient's chart and labs.  Questions were answered to the patient's satisfaction.     Korion Cuevas  No change. Colonoscopy per plan. Coumadin held

## 2013-12-09 NOTE — Op Note (Signed)
[facility] [address] [place], [address]   COLONOSCOPY PROCEDURE REPORT  PATIENT: [name] [name]  MR#: [id number] BIRTHDATE: [birth year] , 73  yrs. old GENDER: female ENDOSCOPIST: R.  Garfield Cornea, MD FACP [facility] REFERRED PY:PPJK Hall, M.D. PROCEDURE DATE:  2014-01-05 PROCEDURE:   Colonoscopy with ablation and Colonoscopy with snare polypectomy INDICATIONS:History of high-grade adenomas removed piecemeal earlier this year. MEDICATIONS: Versed 5 mg IV and Demerol 75 mg IV in divided doses. Zofran 4 mg IV. ASA CLASS:       Class III  CONSENT: The risks, benefits, alternatives and imponderables including but not limited to bleeding, perforation as well as the possibility of a missed lesion have been reviewed.  The potential for biopsy, lesion removal, etc. have also been discussed. Questions have been answered.  All parties agreeable.  Please see the history and physical in the medical record for more information.  DESCRIPTION OF PROCEDURE:   After the risks benefits and alternatives of the procedure were thoroughly explained, informed consent was obtained.  The digital rectal exam revealed no abnormalities of the rectum.   The EC-[id number] ([id number])  endoscope was introduced through the anus and advanced to the terminal ileum which was intubated for a short distance. No adverse events experienced.   The quality of the prep was adequate.  The instrument was then slowly withdrawn as the colon was fully examined.      COLON FINDINGS: Internal hemorrhoids and anal papilla otherwise, normal rectum.  Multiple colonic polyps persist.  There were no polyps in the area of the cecum or ileocecal valve.  This area was inspected very closely.  No clips remain.  With narrowband imaging, there was no evidence of residual polyp in this segment.  There were 5-7 mm polyp millimeter polyps in the ascending segment (3) hot or cold snare removed.  One diminutive polyp was  ablated with tip of the hot snare loop.  There was one each polyp in the descending and sigmoid segments removed with hot snare technique. The remainder of the colonic mucosa appeared normal.  Retroflexion was performed. .  Withdrawal time=22 minutes 0 seconds.  The scope was withdrawn and the procedure completed. COMPLICATIONS: There were no immediate complications.  ENDOSCOPIC IMPRESSION: The Site of larger tubulovillous adenomas about the ileocecal valve and cecum looked good with no residual polyp tissue at those locations. Multiple smaller polyps throughout the colon as outlined above treated/removed as described above.  RECOMMENDATIONS: Follow-up on pathology. Resume Coumadin tomorrow  eSigned:  R. Garfield Cornea, MD Rosalita Chessman [facility] 2014/01/05 9:24 AM   cc:  CPT CODES: ICD CODES:  The ICD and CPT codes recommended by this software are interpretations from the data that the clinical staff has captured with the software.  The verification of the translation of this report to the ICD and CPT codes and modifiers is the sole responsibility of the health care institution and practicing physician where this report was generated.  [employer] will not be held responsible for the validity of the ICD and CPT codes included on this report.  AMA assumes no liability for data contained or not contained herein. CPT is a Designer, television/film set of the [employer].  PATIENT NAME:  [name] [name] MR#: [id number]

## 2013-12-10 ENCOUNTER — Other Ambulatory Visit: Payer: Self-pay | Admitting: *Deleted

## 2013-12-10 ENCOUNTER — Encounter: Payer: Self-pay | Admitting: *Deleted

## 2013-12-10 DIAGNOSIS — D509 Iron deficiency anemia, unspecified: Secondary | ICD-10-CM

## 2013-12-11 ENCOUNTER — Telehealth: Payer: Self-pay

## 2013-12-11 ENCOUNTER — Encounter (HOSPITAL_COMMUNITY): Payer: Self-pay | Admitting: Internal Medicine

## 2013-12-11 ENCOUNTER — Encounter: Payer: Self-pay | Admitting: Internal Medicine

## 2013-12-11 DIAGNOSIS — D509 Iron deficiency anemia, unspecified: Secondary | ICD-10-CM

## 2013-12-11 NOTE — Telephone Encounter (Signed)
Letter mailed to pt. Lab order on file.  Please nic ov Please cc

## 2013-12-11 NOTE — Telephone Encounter (Signed)
Per RMR-  Send letter to patient.  Send copy of letter with path to referring provider and PCP.  Need CBC and office visit with extender in 3 months to determine if further GI evaluation (capsule) is needed

## 2013-12-12 ENCOUNTER — Encounter: Payer: Self-pay | Admitting: Internal Medicine

## 2013-12-12 ENCOUNTER — Other Ambulatory Visit: Payer: Self-pay | Admitting: Internal Medicine

## 2013-12-12 DIAGNOSIS — I482 Chronic atrial fibrillation, unspecified: Secondary | ICD-10-CM

## 2013-12-12 MED ORDER — DIGOXIN 125 MCG PO TABS: 125.0000 ug | ORAL_TABLET | ORAL | Status: DC

## 2013-12-12 NOTE — Telephone Encounter (Signed)
MADE APPT AND LETTER SENT

## 2013-12-12 NOTE — Telephone Encounter (Signed)
Received fax refill request  Rx # [id number]  Medication:  Digoxin 125 mcg tablets Qty 30 Sig:  Take one tablet by mouth every other day Physician:  Lovena Le

## 2013-12-12 NOTE — Telephone Encounter (Signed)
Refill complete 

## 2013-12-16 ENCOUNTER — Telehealth: Payer: Self-pay | Admitting: Cardiology

## 2013-12-16 ENCOUNTER — Encounter: Payer: Medicare Other | Admitting: *Deleted

## 2013-12-16 NOTE — Telephone Encounter (Signed)
LMOVM reminding pt to send remote transmission.   

## 2013-12-17 ENCOUNTER — Encounter: Payer: Self-pay | Admitting: Cardiology

## 2013-12-18 ENCOUNTER — Ambulatory Visit (INDEPENDENT_AMBULATORY_CARE_PROVIDER_SITE_OTHER): Payer: Medicare Other | Admitting: *Deleted

## 2013-12-18 DIAGNOSIS — Z7901 Long term (current) use of anticoagulants: Secondary | ICD-10-CM

## 2013-12-18 DIAGNOSIS — Z5181 Encounter for therapeutic drug level monitoring: Secondary | ICD-10-CM

## 2013-12-18 DIAGNOSIS — I4891 Unspecified atrial fibrillation: Secondary | ICD-10-CM

## 2013-12-18 LAB — POCT INR: INR: 1.6

## 2013-12-30 ENCOUNTER — Telehealth: Payer: Self-pay | Admitting: Internal Medicine

## 2013-12-30 ENCOUNTER — Ambulatory Visit (INDEPENDENT_AMBULATORY_CARE_PROVIDER_SITE_OTHER): Payer: Medicare Other | Admitting: *Deleted

## 2013-12-30 DIAGNOSIS — Z7901 Long term (current) use of anticoagulants: Secondary | ICD-10-CM

## 2013-12-30 DIAGNOSIS — Z5181 Encounter for therapeutic drug level monitoring: Secondary | ICD-10-CM

## 2013-12-30 DIAGNOSIS — I4891 Unspecified atrial fibrillation: Secondary | ICD-10-CM

## 2013-12-30 LAB — POCT INR: INR: 2.2

## 2013-12-30 NOTE — Telephone Encounter (Signed)
Please tell the pt that we would need to see the labs and make sure that they are the same labs that we needed before we can answer that question. Please get a copy of the labs if possible. Thanks.

## 2013-12-30 NOTE — Telephone Encounter (Signed)
Spoke to patient and she will have dr's office fax over labs that were drawn.

## 2013-12-30 NOTE — Telephone Encounter (Signed)
PATIENT CALLED STATING SHE RECEIVED PAPERS TO HAVE LABWORK AND JUST HAD LABS FROM [facility] OFFICE.  IS THIS SOMETHING WE CAN GET RESULTS FROM HIS OFFICE?  PLEASE ADVISE

## 2013-12-30 NOTE — Telephone Encounter (Signed)
Spoke to patient and she will have her physician fax over labs that they drew and I will give them to the nurse

## 2014-01-20 ENCOUNTER — Ambulatory Visit (INDEPENDENT_AMBULATORY_CARE_PROVIDER_SITE_OTHER): Payer: Medicare Other | Admitting: *Deleted

## 2014-01-20 DIAGNOSIS — Z5181 Encounter for therapeutic drug level monitoring: Secondary | ICD-10-CM

## 2014-01-20 DIAGNOSIS — I4891 Unspecified atrial fibrillation: Secondary | ICD-10-CM

## 2014-01-20 DIAGNOSIS — Z7901 Long term (current) use of anticoagulants: Secondary | ICD-10-CM

## 2014-01-20 LAB — POCT INR: INR: 2

## 2014-02-03 ENCOUNTER — Other Ambulatory Visit: Payer: Self-pay | Admitting: Internal Medicine

## 2014-02-19 ENCOUNTER — Telehealth: Payer: Self-pay | Admitting: Gastroenterology

## 2014-02-19 NOTE — Telephone Encounter (Signed)
Patient needs CBC, iron/tibc, ferritin prior to her upcoming OV. Thanks. I reviewed last labs available from Dr. Lowanda Foster 11/2013.

## 2014-02-20 NOTE — Telephone Encounter (Signed)
Lab orders and letter have been mailed to the pt.

## 2014-02-26 ENCOUNTER — Ambulatory Visit (INDEPENDENT_AMBULATORY_CARE_PROVIDER_SITE_OTHER): Payer: Medicare Other | Admitting: *Deleted

## 2014-02-26 DIAGNOSIS — I4891 Unspecified atrial fibrillation: Secondary | ICD-10-CM | POA: Diagnosis not present

## 2014-02-26 DIAGNOSIS — Z5181 Encounter for therapeutic drug level monitoring: Secondary | ICD-10-CM

## 2014-02-26 DIAGNOSIS — Z7901 Long term (current) use of anticoagulants: Secondary | ICD-10-CM

## 2014-02-26 DIAGNOSIS — I480 Paroxysmal atrial fibrillation: Secondary | ICD-10-CM | POA: Diagnosis not present

## 2014-02-26 LAB — POCT INR: INR: 2.2

## 2014-03-13 LAB — CBC WITH DIFFERENTIAL/PLATELET
BASOS ABS: 0 10*3/uL (ref 0.0–0.1)
BASOS PCT: 0 % (ref 0–1)
Eosinophils Absolute: 0.4 10*3/uL (ref 0.0–0.7)
Eosinophils Relative: 8 % — ABNORMAL HIGH (ref 0–5)
HCT: 41 % (ref 36.0–46.0)
HEMOGLOBIN: 12.1 g/dL (ref 12.0–15.0)
Lymphocytes Relative: 26 % (ref 12–46)
Lymphs Abs: 1.2 10*3/uL (ref 0.7–4.0)
MCH: 24 pg — ABNORMAL LOW (ref 26.0–34.0)
MCHC: 29.5 g/dL — ABNORMAL LOW (ref 30.0–36.0)
MCV: 81.2 fL (ref 78.0–100.0)
MPV: 9.5 fL (ref 9.4–12.4)
Monocytes Absolute: 0.4 10*3/uL (ref 0.1–1.0)
Monocytes Relative: 9 % (ref 3–12)
NEUTROS PCT: 57 % (ref 43–77)
Neutro Abs: 2.6 10*3/uL (ref 1.7–7.7)
Platelets: 230 10*3/uL (ref 150–400)
RBC: 5.05 MIL/uL (ref 3.87–5.11)
RDW: 17.8 % — ABNORMAL HIGH (ref 11.5–15.5)
WBC: 4.5 10*3/uL (ref 4.0–10.5)

## 2014-03-13 LAB — IRON AND TIBC
%SAT: 6 % — AB (ref 20–55)
IRON: 30 ug/dL — AB (ref 42–145)
TIBC: 463 ug/dL (ref 250–470)
UIBC: 433 ug/dL — AB (ref 125–400)

## 2014-03-13 LAB — FERRITIN: FERRITIN: 11 ng/mL (ref 10–291)

## 2014-03-14 ENCOUNTER — Ambulatory Visit (INDEPENDENT_AMBULATORY_CARE_PROVIDER_SITE_OTHER): Payer: Medicare Other | Admitting: Gastroenterology

## 2014-03-14 ENCOUNTER — Other Ambulatory Visit: Payer: Self-pay

## 2014-03-14 ENCOUNTER — Encounter: Payer: Self-pay | Admitting: Gastroenterology

## 2014-03-14 VITALS — BP 134/80 | HR 87 | Temp 97.1°F | Ht 68.0 in | Wt 210.6 lb

## 2014-03-14 DIAGNOSIS — D509 Iron deficiency anemia, unspecified: Secondary | ICD-10-CM

## 2014-03-14 NOTE — Progress Notes (Signed)
Primary Care Physician:  Delphina Cahill, MD  Primary Gastroenterologist:  Garfield Cornea, MD   Chief Complaint  Patient presents with  . Follow-up    HPI:  [name] [name] is a 74 y.o. female here for follow up of IDA/anemia of chronic disease. Last seen at time of colonoscopy in 11/2013. She had follow up colonoscopy for history of 2 rather large carpet polyp at ICV and cecum which were tubular villous adenomas. There was no evidence of residual polyps at previous site but there were several other tubular adenomas.   She denies melena, brbpr. No abdominal pain. No constipation/diarrhea. No heartburn. No dysphagia. No vomiting. Her most recent labs as below.  H/O iron infusions in the past but not recently. She reports very little red meat consumption. Avoids dark greens due to coumadin use.   Lab Results  Component Value Date   WBC 4.5 03/12/2014   HGB 12.1 03/12/2014   HCT 41.0 03/12/2014   MCV 81.2 03/12/2014   PLT 230 03/12/2014   Lab Results  Component Value Date   IRON 30* 03/12/2014   TIBC 463 03/12/2014   FERRITIN 11 03/12/2014     Current Outpatient Prescriptions  Medication Sig Dispense Refill  . acetaminophen (TYLENOL) 500 MG tablet Take 500 mg by mouth every 6 (six) hours as needed (pain). For pain    . allopurinol (ZYLOPRIM) 100 MG tablet Take 100 mg by mouth daily.     Marland Kitchen amiodarone (PACERONE) 200 MG tablet TAKE ONE TABLET BY MOUTH ONCE DAILY MONDAY THROUGH FRIDAY, AND 1/2 TABLET SATURDAY AND SUNDAY. 90 tablet 3  . buPROPion (WELLBUTRIN SR) 100 MG 12 hr tablet Take 1 tablet (100 mg total) by mouth daily. 90 tablet 0  . carvedilol (COREG) 25 MG tablet Take 25 mg by mouth 2 (two) times daily with a meal.    . cholecalciferol (VITAMIN D) 1000 UNITS tablet Take 1,000 Units by mouth daily.    . digoxin (LANOXIN) 0.125 MG tablet Take 1 tablet (125 mcg total) by mouth every other day. 30 tablet 6  . furosemide (LASIX) 40 MG tablet Take 40 mg by mouth 2 (two) times daily.     . IRON PO Take 150 mg by mouth daily.    Marland Kitchen spironolactone (ALDACTONE) 25 MG tablet Take 1 tablet (25 mg total) by mouth daily. 30 tablet 6  . warfarin (COUMADIN) 5 MG tablet Take 1 tablet daily except 1/2 tablet on Tuesdays and Fridays or as directed. 45 tablet 3   No current facility-administered medications for this visit.    Allergies as of 03/14/2014 - Review Complete 03/14/2014  Allergen Reaction Noted  . Penicillins Other (See Comments)     Past Medical History  Diagnosis Date  . Nonischemic cardiomyopathy 1998    EF of 25% in 1998, 15% in 2006; catheterization in 3/07-50% D1; luminal      irregularities in the other vessels; PA-65/25. 20% EF in 04/2007 with moderate MR and pulmonary htn; AICD/biventricular pacing--05/2005  . Hypertension   . Atrial fibrillation     plus nonsustained ventricular tachycardia --> amiodarone treatment  . Nonsustained ventricular tachycardia   . OSA (obstructive sleep apnea)     no sleep machine  . Chronic kidney disease, stage 4, severely decreased GFR     Creatinine of 2.1 in 09/2008, 1.9 in 11/1998 and, 2.3 in 05/2010  . Venous stasis   . Gout     Uric acid of 12.5 in 03/2009  . Chronic anticoagulation   .  History of lupus   . CHF (congestive heart failure)   . Chronic kidney disease     Past Surgical History  Procedure Laterality Date  . Appendectomy  1987  . Incision and drainage intra oral abscess  1991    submandibular abscess  . Cardiac defibrillator placement  05/2005    Medtronic Insync, Biventricular  . Colonoscopy with esophagogastroduodenoscopy (egd) N/A 03/20/2013    RMR: Gastric erosions and nodularity as described above s/p bx s/p hemostasis cliping/Rectal polyp-removed as described above. Polyp at ileocecal valve and cecum requiring saline/epinephrine injection assistance piecemeal polypectomy with subsequent APC ablation and clip. Stomach biopsy showed mild gastritis with H. pylori. Cecal and ileocecal valve polyp showed  tubulovillous adenoma. Rectum TA  . Colonoscopy N/A 12/09/2013    RMR: The site of larger tubulovillous adenomas about the ileocecal valve and cecum looked good with no residual polyp tissue at those locations. Multiple smaller polyps throughout the colon as outlined above treated/removed as described above. tubular adenomas. next TCS 11/2016    Family History  Problem Relation Age of Onset  . Heart attack Mother 48  . Kidney failure Father 70  . Heart attack Brother   . Colon cancer Neg Hx     History   Social History  . Marital Status: Single    Spouse Name: N/A  . Number of Children:  0  . Years of Education: N/A   Occupational History  . Retired     Environmental manager work; Programmer, applications   Social History Main Topics  . Smoking status: Current Every Day Smoker -- 0.50 packs/day for 30 years    Types: Cigarettes  . Smokeless tobacco: Never Used     Comment: pt states that she is trying to quit  . Alcohol Use: No  . Drug Use: No  . Sexual Activity: Not on file   Other Topics Concern  . Not on file   Social History Narrative   Resides with a niece in [place]      ROS:  General: Negative for anorexia, weight loss, fever, chills, fatigue, weakness. Eyes: Negative for vision changes.  ENT: Negative for hoarseness, difficulty swallowing , nasal congestion. CV: Negative for chest pain, angina, palpitations, dyspnea on exertion, peripheral edema.  Respiratory: Negative for dyspnea at rest, dyspnea on exertion, cough, sputum, wheezing.  GI: See history of present illness. GU:  Negative for dysuria, hematuria, urinary incontinence, urinary frequency, nocturnal urination.  MS: Negative for joint pain, low back pain.  Derm: Negative for rash or itching.  Neuro: Negative for weakness, abnormal sensation, seizure, frequent headaches, memory loss, confusion.  Psych: Negative for anxiety, depression, suicidal ideation, hallucinations.  Endo: Negative for unusual weight change.  Heme:  Negative for bruising or bleeding. Allergy: Negative for rash or hives.    Physical Examination:  BP 134/80 mmHg  Pulse 87  Temp(Src) 97.1 F (36.2 C)  Ht 5\' 8"  (1.727 m)  Wt 210 lb 9.6 oz (95.528 kg)  BMI 32.03 kg/m2   General: Well-nourished, well-developed in no acute distress.  Head: Normocephalic, atraumatic.   Eyes: Conjunctiva pink, no icterus. Mouth: Oropharyngeal mucosa moist and pink , no lesions erythema or exudate. Neck: Supple without thyromegaly, masses, or lymphadenopathy.  Lungs: Clear to auscultation bilaterally.  Heart: Regular rate and rhythm, no murmurs rubs or gallops.  Abdomen: Bowel sounds are normal, nontender, nondistended, no hepatosplenomegaly or masses, no abdominal bruits or    hernia , no rebound or guarding.   Rectal: not performed Extremities:  No lower extremity edema. No clubbing or deformities.  Neuro: Alert and oriented x 4 , grossly normal neurologically.  Skin: Warm and dry, no rash or jaundice.   Psych: Alert and cooperative, normal mood and affect.  Labs: Lab Results  Component Value Date   WBC 4.5 03/12/2014   HGB 12.1 03/12/2014   HCT 41.0 03/12/2014   MCV 81.2 03/12/2014   PLT 230 03/12/2014    Lab Results  Component Value Date   IRON 30* 03/12/2014   TIBC 463 03/12/2014   FERRITIN 11 03/12/2014     Imaging Studies: No results found.

## 2014-03-14 NOTE — Patient Instructions (Signed)
We will complete your GI work up for iron deficiency anemia with a small bowel capsule study. See separate instructions.  Please contact Dr. Nevada Crane about help with smoking cessation. Congratulations on your decision to stop!     Iron-Rich Diet An iron-rich diet contains foods that are good sources of iron. Iron is an important mineral that helps your body produce hemoglobin. Hemoglobin is a protein in red blood cells that carries oxygen to the body's tissues. Sometimes, the iron level in your blood can be low. This may be caused by:  A lack of iron in your diet.  Blood loss.  Times of growth, such as during pregnancy or during a child's growth and development. Low levels of iron can cause a decrease in the number of red blood cells. This can result in iron deficiency anemia. Iron deficiency anemia symptoms include:  Tiredness.  Weakness.  Irritability.  Increased chance of infection. Here are some recommendations for daily iron intake:  Males older than 72 years of age need 8 mg of iron per day.  Women ages 51 to 60 need 18 mg of iron per day.  Pregnant women need 27 mg of iron per day, and women who are over 52 years of age and breastfeeding need 9 mg of iron per day.  Women over the age of 8 need 8 mg of iron per day. SOURCES OF IRON There are 2 types of iron that are found in food: heme iron and nonheme iron. Heme iron is absorbed by the body better than nonheme iron. Heme iron is found in meat, poultry, and fish. Nonheme iron is found in grains, beans, and vegetables. Heme Iron Sources Food / Iron (mg)  Chicken liver, 3 oz (85 g)/ 10 mg  Beef liver, 3 oz (85 g)/ 5.5 mg  Oysters, 3 oz (85 g)/ 8 mg  Beef, 3 oz (85 g)/ 2 to 3 mg  Shrimp, 3 oz (85 g)/ 2.8 mg  [place], 3 oz (85 g)/ 2 mg  Chicken, 3 oz (85 g) / 1 mg  Fish (tuna, halibut), 3 oz (85 g)/ 1 mg  Pork, 3 oz (85 g)/ 0.9 mg Nonheme Iron Sources Food / Iron (mg)  Ready-to-eat breakfast cereal,  iron-fortified / 3.9 to 7 mg  Tofu,  cup / 3.4 mg  Kidney beans,  cup / 2.6 mg  Baked potato with skin / 2.7 mg  Asparagus,  cup / 2.2 mg  Avocado / 2 mg  Dried peaches,  cup / 1.6 mg  Raisins,  cup / 1.5 mg  Soy milk, 1 cup / 1.5 mg  Whole-wheat bread, 1 slice / 1.2 mg  Spinach, 1 cup / 0.8 mg  Broccoli,  cup / 0.6 mg IRON ABSORPTION Certain foods can decrease the body's absorption of iron. Try to avoid these foods and beverages while eating meals with iron-containing foods:  Coffee.  Tea.  Fiber.  Soy. Foods containing vitamin C can help increase the amount of iron your body absorbs from iron sources, especially from nonheme sources. Eat foods with vitamin C along with iron-containing foods to increase your iron absorption. Foods that are high in vitamin C include many fruits and vegetables. Some good sources are:  Fresh orange juice.  Oranges.  Strawberries.  Mangoes.  Grapefruit.  Red bell peppers.  Green bell peppers.  Broccoli.  Potatoes with skin.  Tomato juice. Document Released: 08/24/2004 Document Revised: 04/04/2011 Document Reviewed: 07/01/2010 [facility] Patient Information 2015 [place], [place]. This information is not intended  to replace advice given to you by your health care provider. Make sure you discuss any questions you have with your health care provider.  

## 2014-03-14 NOTE — Progress Notes (Signed)
Per [employer] no PA is needed

## 2014-03-17 NOTE — Progress Notes (Signed)
Quick Note:  Patient aware of labs. See OV note. ______

## 2014-03-17 NOTE — Assessment & Plan Note (Addendum)
74 y/o female with h/o IDA/anemia of chronic disease. Hgb improved but persistent IDA. To complete GI work up, plan for small bowel Given's capsule. List of iron rich foods provided. Continue oral iron supplement. Further recommendations to follow.

## 2014-03-18 ENCOUNTER — Other Ambulatory Visit: Payer: Self-pay | Admitting: Internal Medicine

## 2014-03-18 NOTE — Progress Notes (Signed)
cc'ed to pcp °

## 2014-03-26 ENCOUNTER — Telehealth: Payer: Self-pay | Admitting: Gastroenterology

## 2014-03-26 ENCOUNTER — Ambulatory Visit (INDEPENDENT_AMBULATORY_CARE_PROVIDER_SITE_OTHER): Payer: Medicare Other | Admitting: *Deleted

## 2014-03-26 ENCOUNTER — Encounter: Payer: Self-pay | Admitting: Internal Medicine

## 2014-03-26 DIAGNOSIS — Z7901 Long term (current) use of anticoagulants: Secondary | ICD-10-CM | POA: Diagnosis not present

## 2014-03-26 DIAGNOSIS — I480 Paroxysmal atrial fibrillation: Secondary | ICD-10-CM

## 2014-03-26 DIAGNOSIS — I4891 Unspecified atrial fibrillation: Secondary | ICD-10-CM | POA: Diagnosis not present

## 2014-03-26 DIAGNOSIS — Z5181 Encounter for therapeutic drug level monitoring: Secondary | ICD-10-CM

## 2014-03-26 LAB — POCT INR: INR: 1.8

## 2014-03-26 NOTE — Telephone Encounter (Signed)
Discuss case with Dr. Theador Hawthorne.  He had concerns why patient was not on PPI therapy after EGD last February when she was found to have gastritis. Biopsies were positive for H. pylori. She was treated with Pylera. He wonders if ongoing IDA may be related to gastric source. Patient has been asymptomatic. Discussed with Dr. Theador Hawthorne, he would like for her to go ahead and proceed with capsule study as planned for this week. We will see her back in the office for follow-up and ask her to take PPI over the course the next several months to see if there is any change in her iron levels per Cr. Bhutani's request.     Please schedule patient for follow up OV in 2-3 weeks to discuss capsule findings and considering PPI therapy.

## 2014-03-26 NOTE — Telephone Encounter (Signed)
APPOINTMENT MADE AND LETTER SENT °

## 2014-03-27 ENCOUNTER — Telehealth: Payer: Self-pay | Admitting: Gastroenterology

## 2014-03-27 NOTE — Telephone Encounter (Signed)
Pt is aware that we are canceling the test. i also told her that we would be in touch with her.

## 2014-03-27 NOTE — Telephone Encounter (Signed)
Received phone call from Endo regarding patient's defibrillator status. Defibrillator is a contraindication to capsule endoscopy. It has not been FDA approved in these patients. Discussed with Dr. Gala Romney. At this time we will cancel her capsule endoscopy. I will discuss with radiologist most appropriate study, CTE or MRE in this patient with renal insufficiency. She may be able to have the study without any IV contrast. As soon as we've determined the appropriate test, we will call and schedule. Please have the patient we will hopefully have more information for her by the beginning of the week.  Her GFR 11/2013 was 23.

## 2014-03-28 ENCOUNTER — Encounter (HOSPITAL_COMMUNITY): Admission: RE | Payer: Self-pay | Source: Ambulatory Visit

## 2014-03-28 ENCOUNTER — Ambulatory Visit (HOSPITAL_COMMUNITY): Admission: RE | Admit: 2014-03-28 | Payer: Medicare Other | Source: Ambulatory Visit | Admitting: Internal Medicine

## 2014-03-28 SURGERY — IMAGING PROCEDURE, GI TRACT, INTRALUMINAL, VIA CAPSULE

## 2014-04-11 NOTE — Telephone Encounter (Signed)
Discussed with radiology. Patient has renal insufficiency. Not a candidate for MRI due to defibrillator and CRI. CTE would have to be done with oral contrast only but may be better than SBFT given her size.   Patient has an OV next week. I will discuss options with her then and see what she wants to do.

## 2014-04-14 ENCOUNTER — Encounter: Payer: Self-pay | Admitting: Gastroenterology

## 2014-04-14 ENCOUNTER — Ambulatory Visit (INDEPENDENT_AMBULATORY_CARE_PROVIDER_SITE_OTHER): Payer: Medicare Other | Admitting: Gastroenterology

## 2014-04-14 VITALS — BP 136/85 | HR 83 | Temp 98.1°F | Ht 68.5 in | Wt 214.4 lb

## 2014-04-14 DIAGNOSIS — R6881 Early satiety: Secondary | ICD-10-CM | POA: Diagnosis not present

## 2014-04-14 DIAGNOSIS — D509 Iron deficiency anemia, unspecified: Secondary | ICD-10-CM | POA: Diagnosis not present

## 2014-04-14 MED ORDER — PANTOPRAZOLE SODIUM 40 MG PO TBEC: 40.0000 mg | DELAYED_RELEASE_TABLET | Freq: Every day | ORAL | Status: DC

## 2014-04-14 NOTE — Patient Instructions (Signed)
1. Start pantoprazole once daily before breakfast to see if this helps your appetite. You may have some underlying gastritis and pantoprazole will help. 2. I also want you to submit a stool specimen to make sure the infection you had in your stomach that caused gastritis last year is gone. YOU NEED TO COLLECT IT BEFORE YOU START PANTOPRAZOLE. 3. You will have labs done in 6 weeks. If you anemia is worse, then we will consider xray of your small intestine.  4. Return to the office in 8 weeks.

## 2014-04-14 NOTE — Assessment & Plan Note (Addendum)
73 year old lady with history of IDA/anemia of chronic disease in the setting of renal insufficiency. Previously required IV iron infusions but none recently. Currently on oral iron daily. Hemoglobin has improved but iron remains low. We have planned a small bowel capsule endoscopy however since she has a defibrillator it cannot be done. Not FDA approved in these patients. Discussed with Dr. Thornton Papas, radiology, given her renal insufficiency thenext best option for her to evaluate her small bowel would be a CT enterography with oral contrast only. Discussed with the patient, this study would likely show up significant findings such as a tumor but of course would not show small bowel AVMs, etc.  At this time the patient would like to hold off on any kind of imaging. We will check an H. pylori stool antigen to ensure that we eradicated H. pylori which could continue contribute to ongoing early satiety and appetite issues as well as IDA. Empirical trial with pantoprazole 40 mg daily for 8 weeks. We will recheck her labs including iron/TIBC, ferritin, CBC in 6 weeks. Return to the office in 8 weeks. If her anemia is unchanged or worse, patient would agree to CT enterography with oral contrast only at that time.

## 2014-04-14 NOTE — Telephone Encounter (Signed)
Noted  

## 2014-04-14 NOTE — Progress Notes (Signed)
Primary Care Physician: Delphina Cahill, MD  Primary Gastroenterologist:  Garfield Cornea, MD   Chief Complaint  Patient presents with  . OTHER    Discuss Capsule results    HPI: [name] [name] is a 74 y.o. female here for follow-up. She has a history of IDA/anemia chronic disease. 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 There was no residual polyps noted but there were several other tubular adenomas. She has a history of IV iron infusions in the past but none recently. Consumes very little red meat. Avoids greens due to Coumadin use. Her hemoglobin was normal at 12.1 one month ago. Her ferritin was low normal at 11, we do not have any prior ones to compare to. Her iron was low at 30, iron saturations low at 6%, TIBC upper limits of normal for 63.   I spoke with Dr. Theador Hawthorne a few weeks ago, he was concerned that she is not on a PPI therapy after her EGD last February which showed gastritis. She was treated for H. pylori gastritis. Patient was asymptomatic afterwards therefore PPI was not continued. We had planned on [facility] capsule endoscopy to further evaluate her IDA, however she has a defibrillator so it had to be canceled. This test has not been FDA approved in these patients. Because of her renal insufficiency she cannot receive contrast for CT enterography or MR enterography which limits the capabilities to further evaluate her IDA. I spoke with radiologist, Dr. Thornton Papas, he felt like CT enterography with oral contrast would be the best option, although does have its limitations. SBFT not good given her body habitus.   Eating iron rich foods. Appetite kind of funny. Feels hungry but has early satiety. No heartburn or dysphagia. Bowel movements are regular. No melena rectal bleeding. No abdominal pain. No weight loss.  Current Outpatient Prescriptions  Medication Sig Dispense Refill  . acetaminophen  (TYLENOL) 500 MG tablet Take 500 mg by mouth every 6 (six) hours as needed (pain). For pain    . allopurinol (ZYLOPRIM) 100 MG tablet Take 100 mg by mouth daily.     Marland Kitchen amiodarone (PACERONE) 200 MG tablet TAKE ONE TABLET BY MOUTH ONCE DAILY MONDAY THROUGH FRIDAY, AND 1/2 TABLET SATURDAY AND SUNDAY. 90 tablet 3  . buPROPion (WELLBUTRIN SR) 100 MG 12 hr tablet Take 1 tablet (100 mg total) by mouth daily. (Patient taking differently: Take 300 mg by mouth daily. ) 90 tablet 0  . carvedilol (COREG) 25 MG tablet Take 25 mg by mouth 2 (two) times daily with a meal.    . cholecalciferol (VITAMIN D) 1000 UNITS tablet Take 1,000 Units by mouth daily.    . digoxin (LANOXIN) 0.125 MG tablet Take 1 tablet (125 mcg total) by mouth every other day. 30 tablet 6  . furosemide (LASIX) 40 MG tablet Take 40 mg by mouth 2 (two) times daily.    . IRON PO Take 150 mg by mouth daily.    Marland Kitchen spironolactone (ALDACTONE) 25 MG tablet Take 1 tablet (25 mg total) by mouth daily. 30 tablet 6  . warfarin (COUMADIN) 5 MG tablet Take 1 tablet daily except 1/2 tablet on Tuesdays or as directed 45 tablet 3   No current facility-administered medications for this visit.    Allergies as of 04/14/2014 - Review Complete 04/14/2014  Allergen Reaction Noted  . Penicillins Other (See Comments)     ROS:  General: Negative  for anorexia, weight loss, fever, chills, fatigue, weakness. ENT: Negative for hoarseness, difficulty swallowing , nasal congestion. CV: Negative for chest pain, angina, palpitations, dyspnea on exertion, peripheral edema.  Respiratory: Negative for dyspnea at rest, dyspnea on exertion, cough, sputum, wheezing.  GI: See history of present illness. GU:  Negative for dysuria, hematuria, urinary incontinence, urinary frequency, nocturnal urination.  Endo: Negative for unusual weight change.    Physical Examination:   BP 136/85 mmHg  Pulse 83  Temp(Src) 98.1 F (36.7 C) (Oral)  Ht 5' 8.5" (1.74 m)  Wt 214 lb 6.4  oz (97.251 kg)  BMI 32.12 kg/m2  General: Well-nourished, well-developed in no acute distress.  Eyes: No icterus. Mouth: Oropharyngeal mucosa moist and pink , no lesions erythema or exudate. Lungs: Clear to auscultation bilaterally.  Heart: Regular rate and rhythm, no murmurs rubs or gallops.  Abdomen: Bowel sounds are normal, nontender, nondistended, no hepatosplenomegaly or masses, no abdominal bruits or hernia , no rebound or guarding.   Extremities: No lower extremity edema. No clubbing or deformities. Neuro: Alert and oriented x 4   Skin: Warm and dry, no jaundice.   Psych: Alert and cooperative, normal mood and affect.  Labs:  Lab Results  Component Value Date   IRON 30* 03/12/2014   TIBC 463 03/12/2014   FERRITIN 11 03/12/2014   Lab Results  Component Value Date   WBC 4.5 03/12/2014   HGB 12.1 03/12/2014   HCT 41.0 03/12/2014   MCV 81.2 03/12/2014   PLT 230 03/12/2014    Imaging Studies: No results found.

## 2014-04-15 NOTE — Progress Notes (Signed)
cc'ed to pcp °

## 2014-04-16 ENCOUNTER — Ambulatory Visit (INDEPENDENT_AMBULATORY_CARE_PROVIDER_SITE_OTHER): Payer: Medicare Other | Admitting: *Deleted

## 2014-04-16 DIAGNOSIS — Z5181 Encounter for therapeutic drug level monitoring: Secondary | ICD-10-CM | POA: Diagnosis not present

## 2014-04-16 DIAGNOSIS — I4891 Unspecified atrial fibrillation: Secondary | ICD-10-CM

## 2014-04-16 DIAGNOSIS — Z7901 Long term (current) use of anticoagulants: Secondary | ICD-10-CM

## 2014-04-16 DIAGNOSIS — I480 Paroxysmal atrial fibrillation: Secondary | ICD-10-CM | POA: Diagnosis not present

## 2014-04-16 LAB — POCT INR: INR: 1.3

## 2014-04-17 ENCOUNTER — Telehealth: Payer: Self-pay

## 2014-04-17 NOTE — Telephone Encounter (Signed)
Called pt again. No answer. Unable to leave message.

## 2014-04-17 NOTE — Telephone Encounter (Signed)
[facility] called and said that pt dropped of specimen but the specimen was not labeled and the pt had no orders with specimen. Called pt to come by office to pick up another container because the specimen will have to be recollected.  Unable to leave message. Will call back again at a later time.

## 2014-04-25 NOTE — Telephone Encounter (Signed)
Called pt again and she did answer but lost connection

## 2014-04-26 NOTE — Telephone Encounter (Signed)
Called pt and she is aware of specimen needing to be recollected. States she will come by office next week and pick up new container.

## 2014-04-29 ENCOUNTER — Other Ambulatory Visit: Payer: Self-pay

## 2014-04-29 ENCOUNTER — Other Ambulatory Visit: Payer: Self-pay | Admitting: Gastroenterology

## 2014-04-29 DIAGNOSIS — D509 Iron deficiency anemia, unspecified: Secondary | ICD-10-CM

## 2014-04-29 NOTE — Telephone Encounter (Signed)
Noted  

## 2014-04-29 NOTE — Telephone Encounter (Signed)
I received a call from [name] at [facility]- he said the [name] [name] turned in a urine sample instead of a stool sample and they are unable to complete the hpylori stool test for her.  I called the [name] [name], she said she did turn in a urine sample, she said she didn't know it was supposed to be a stool sample. I am going to send her one of the smaller collection bottles in the mail. I asked her to collect a stool sample and take it to the lab. Informed her to put it in the refrigerator and take it to the lab within 1-2 days after collecting. [name] [name] stated she understood. Container and order are in the mail to the [name] [name].

## 2014-04-29 NOTE — Telephone Encounter (Signed)
I also included instructions on how to collect the sample and instructed pt that she needed to stop pantoprazole for 2 weeks prior to collecting sample.

## 2014-05-05 ENCOUNTER — Ambulatory Visit (INDEPENDENT_AMBULATORY_CARE_PROVIDER_SITE_OTHER): Payer: Medicare Other | Admitting: *Deleted

## 2014-05-05 DIAGNOSIS — Z5181 Encounter for therapeutic drug level monitoring: Secondary | ICD-10-CM

## 2014-05-05 DIAGNOSIS — I480 Paroxysmal atrial fibrillation: Secondary | ICD-10-CM | POA: Diagnosis not present

## 2014-05-05 DIAGNOSIS — Z7901 Long term (current) use of anticoagulants: Secondary | ICD-10-CM | POA: Diagnosis not present

## 2014-05-05 DIAGNOSIS — I4891 Unspecified atrial fibrillation: Secondary | ICD-10-CM

## 2014-05-05 LAB — POCT INR: INR: 1.8

## 2014-05-08 ENCOUNTER — Other Ambulatory Visit: Payer: Self-pay | Admitting: Internal Medicine

## 2014-05-14 ENCOUNTER — Telehealth: Payer: Self-pay | Admitting: Gastroenterology

## 2014-05-14 NOTE — Telephone Encounter (Signed)
Labs dated 05/02/2014 White blood cell count 4700, hemoglobin 13.9, hematocrit 44, platelets 195,000, iron 192 high, iron saturations 44%, TIBC 440, ferritin 19  H. pylori stool antigen negative  Her Hgb and iron are all better. Please find out if patient has received recent iron infusion.  Keep appointment in May as planned

## 2014-05-15 NOTE — Telephone Encounter (Signed)
Tried to call pt- NA 

## 2014-05-20 NOTE — Telephone Encounter (Signed)
Pt is aware. She is taking iron pills everyday, she is not getting iron infusions. She is aware of her appt date and time in May.

## 2014-05-20 NOTE — Telephone Encounter (Signed)
Good.

## 2014-05-26 ENCOUNTER — Ambulatory Visit (INDEPENDENT_AMBULATORY_CARE_PROVIDER_SITE_OTHER): Payer: Medicare Other | Admitting: *Deleted

## 2014-05-26 DIAGNOSIS — Z7901 Long term (current) use of anticoagulants: Secondary | ICD-10-CM | POA: Diagnosis not present

## 2014-05-26 DIAGNOSIS — Z5181 Encounter for therapeutic drug level monitoring: Secondary | ICD-10-CM | POA: Diagnosis not present

## 2014-05-26 DIAGNOSIS — I480 Paroxysmal atrial fibrillation: Secondary | ICD-10-CM

## 2014-05-26 DIAGNOSIS — I4891 Unspecified atrial fibrillation: Secondary | ICD-10-CM | POA: Diagnosis not present

## 2014-05-26 LAB — POCT INR: INR: 1.6

## 2014-06-06 ENCOUNTER — Encounter: Payer: Self-pay | Admitting: Internal Medicine

## 2014-06-09 ENCOUNTER — Ambulatory Visit (INDEPENDENT_AMBULATORY_CARE_PROVIDER_SITE_OTHER): Payer: Medicare Other | Admitting: *Deleted

## 2014-06-09 DIAGNOSIS — Z7901 Long term (current) use of anticoagulants: Secondary | ICD-10-CM | POA: Diagnosis not present

## 2014-06-09 DIAGNOSIS — I4891 Unspecified atrial fibrillation: Secondary | ICD-10-CM | POA: Diagnosis not present

## 2014-06-09 DIAGNOSIS — Z5181 Encounter for therapeutic drug level monitoring: Secondary | ICD-10-CM

## 2014-06-09 LAB — POCT INR: INR: 3.1

## 2014-06-16 ENCOUNTER — Encounter: Payer: Self-pay | Admitting: Gastroenterology

## 2014-06-16 ENCOUNTER — Ambulatory Visit (INDEPENDENT_AMBULATORY_CARE_PROVIDER_SITE_OTHER): Payer: Medicare Other | Admitting: Gastroenterology

## 2014-06-16 VITALS — BP 131/89 | HR 83 | Temp 97.6°F | Ht 67.0 in | Wt 215.6 lb

## 2014-06-16 DIAGNOSIS — D509 Iron deficiency anemia, unspecified: Secondary | ICD-10-CM | POA: Diagnosis not present

## 2014-06-16 DIAGNOSIS — R6881 Early satiety: Secondary | ICD-10-CM

## 2014-06-16 NOTE — Assessment & Plan Note (Signed)
IDA/anemia chronic disease in the setting of renal insufficiency. Previously required IV iron infusions but none recently. Continues oral iron therapy. Hemoglobin been continues to improve, iron and ferritin up as well. Ferritin remains low normal. History of H. pylori gastritis with proven eradication via stool antigen. Colonoscopy with history of advanced polyps as previously outlined, up-to-date. Small bowel capsule endoscopy could not be done because she has a defibrillator. Because of her renal insufficiency CT enterography would have to be done with oral contrast only. Patient has elected to hold off on any further testing given improvement of her iron.  Continues to have vague early satiety symptoms. Given her history of H. pylori gastritis, wanted to try PPI therapy for couple months to see this would improve her symptoms. Patient misunderstood directions and did not call us to let us know that she was not taking her medication. She felt "weird" on pantoprazole 40 mg daily. Suggested switching to a different PPI but she would like to try half of the pantoprazole daily first that she has the medication at home. If she does not tolerate, she will call and let me know. Our phone number provided on her printout today.  Otherwise recheck her labs including CBC, iron/TIBC, ferritin in 3 months followed by an office visit with Dr. Gala Romney in 3 months.

## 2014-06-16 NOTE — Patient Instructions (Signed)
1. You can try taking 1/2 pantoprazole daily before breakfast to see if you tolerate. If not, stop the medication and call me. 2. Return to the office in 3 months to see Dr. Gala Romney.  3. Please have your labs done a few days before your next office visit.  Call us at [phone] if you have any questions or concerns.

## 2014-06-16 NOTE — Progress Notes (Signed)
Primary Care Physician: Delphina Cahill, MD  Primary Gastroenterologist:  Garfield Cornea, MD    Chief Complaint  Patient presents with  . Anemia    HPI: [name] [name] is a 74 y.o. female here for follow-up. She has a history of IDA/anemia chronic disease. 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 There was no residual polyps noted but there were several other tubular adenomas. She has a history of IV iron infusions in the past but none recently. Consumes very little red meat. Avoids greens due to Coumadin use. EGD last February which showed gastritis. She was treated for H. pylori gastritis. Patient was asymptomatic afterwards therefore PPI was not continued. We had planned on [facility] capsule endoscopy to further evaluate her IDA, however she has a defibrillator so it had to be canceled. This test has not been FDA approved in these patients. Because of her renal insufficiency she cannot receive contrast for CT enterography or MR enterography which limits the capabilities to further evaluate her IDA. I spoke with radiologist, Dr. Thornton Papas, he felt like CT enterography with oral contrast would be the best option, although does have its limitations. SBFT not good given her body habitus.   After last office visit we checked H. pylori stool antigen and this was negative. She was advised to begin pantoprazole 40 mg daily for 8 weeks after she collected her stool specimen but she misunderstood. She is a difficult historian. She got the medication filled in March but has not had to refill it because she's not taken it regularly. States she took it for a few days and felt weird but no specific symptoms. She stopped taking it. She did not call and let us know this. Follow-up labs as outlined below showed improvement overall. She has been on oral iron only. No recent IV infusions. Denies any heartburn. States she has a set  appetite but when she starts eating she feels full quickly. This is her only symptom. Denies abdominal pain. Bowel movements are regular. No melena rectal bleeding.   Current Outpatient Prescriptions  Medication Sig Dispense Refill  . acetaminophen (TYLENOL) 500 MG tablet Take 500 mg by mouth every 6 (six) hours as needed (pain). For pain    . allopurinol (ZYLOPRIM) 100 MG tablet Take 100 mg by mouth daily.     Marland Kitchen amiodarone (PACERONE) 200 MG tablet TAKE ONE TABLET BY MOUTH ONCE DAILY MONDAY THROUGH FRIDAY, AND 1/2 TABLET SATURDAY AND SUNDAY. 90 tablet 3  . buPROPion (WELLBUTRIN SR) 100 MG 12 hr tablet Take 1 tablet (100 mg total) by mouth daily. (Patient taking differently: Take 300 mg by mouth daily. ) 90 tablet 0  . carvedilol (COREG) 25 MG tablet Take 25 mg by mouth 2 (two) times daily with a meal.    . cholecalciferol (VITAMIN D) 1000 UNITS tablet Take 1,000 Units by mouth daily.    . digoxin (LANOXIN) 0.125 MG tablet Take 1 tablet (125 mcg total) by mouth every other day. 30 tablet 6  . furosemide (LASIX) 40 MG tablet Take 40 mg by mouth 2 (two) times daily.    .       . IRON PO Take 150 mg by mouth daily.    Marland Kitchen spironolactone (ALDACTONE) 25 MG tablet TAKE ONE TABLET BY MOUTH ONCE DAILY IN THE MORNING. 30 tablet 3  . warfarin (COUMADIN) 5 MG tablet Take 1 tablet daily except 1/2  tablet on Tuesdays or as directed 45 tablet 3  .         No current facility-administered medications for this visit.    Allergies as of 06/16/2014 - Review Complete 06/16/2014  Allergen Reaction Noted  . Penicillins Other (See Comments)     ROS:  General: Negative for anorexia, weight loss, fever, chills, fatigue, weakness. ENT: Negative for hoarseness, difficulty swallowing , nasal congestion. CV: Negative for chest pain, angina, palpitations, dyspnea on exertion, peripheral edema.  Respiratory: Negative for dyspnea at rest, dyspnea on exertion, cough, sputum, wheezing.  GI: See history of present  illness. GU:  Negative for dysuria, hematuria, urinary incontinence, urinary frequency, nocturnal urination.  Endo: Negative for unusual weight change.    Physical Examination:   BP 131/89 mmHg  Pulse 83  Temp(Src) 97.6 F (36.4 C) (Oral)  Ht 5\' 7"  (1.702 m)  Wt 215 lb 9.6 oz (97.796 kg)  BMI 33.76 kg/m2  General: Well-nourished, well-developed in no acute distress.  Eyes: No icterus. Mouth: Oropharyngeal mucosa moist and pink , no lesions erythema or exudate. Lungs: Clear to auscultation bilaterally.  Heart: Regular rate and rhythm, no murmurs rubs or gallops.  Abdomen: Bowel sounds are normal, nontender, nondistended, no hepatosplenomegaly or masses, no abdominal bruits or hernia , no rebound or guarding.   Extremities: No lower extremity edema. No clubbing or deformities. Neuro: Alert and oriented x 4   Skin: Warm and dry, no jaundice.   Psych: Alert and cooperative, normal mood and affect.  Labs:           Expand All Collapse All   Labs dated 05/02/2014 White blood cell count 4700, hemoglobin 13.9, hematocrit 44, platelets 195,000, iron 192 high, iron saturations 44%, TIBC 440, ferritin 19  H. pylori stool antigen negative        Imaging Studies: No results found.

## 2014-06-17 ENCOUNTER — Other Ambulatory Visit: Payer: Self-pay

## 2014-06-17 DIAGNOSIS — D509 Iron deficiency anemia, unspecified: Secondary | ICD-10-CM

## 2014-06-17 NOTE — Progress Notes (Signed)
cc'ed to pcp °

## 2014-06-25 ENCOUNTER — Ambulatory Visit (INDEPENDENT_AMBULATORY_CARE_PROVIDER_SITE_OTHER): Payer: Medicare Other | Admitting: *Deleted

## 2014-06-25 DIAGNOSIS — Z7901 Long term (current) use of anticoagulants: Secondary | ICD-10-CM

## 2014-06-25 DIAGNOSIS — I4891 Unspecified atrial fibrillation: Secondary | ICD-10-CM | POA: Diagnosis not present

## 2014-06-25 DIAGNOSIS — Z5181 Encounter for therapeutic drug level monitoring: Secondary | ICD-10-CM | POA: Diagnosis not present

## 2014-06-25 LAB — POCT INR: INR: 2.7

## 2014-07-24 ENCOUNTER — Ambulatory Visit (INDEPENDENT_AMBULATORY_CARE_PROVIDER_SITE_OTHER): Payer: Medicare Other | Admitting: *Deleted

## 2014-07-24 DIAGNOSIS — I4891 Unspecified atrial fibrillation: Secondary | ICD-10-CM | POA: Diagnosis not present

## 2014-07-24 DIAGNOSIS — Z7901 Long term (current) use of anticoagulants: Secondary | ICD-10-CM

## 2014-07-24 DIAGNOSIS — Z5181 Encounter for therapeutic drug level monitoring: Secondary | ICD-10-CM

## 2014-07-24 LAB — POCT INR: INR: 2.9

## 2014-07-30 ENCOUNTER — Encounter: Payer: Self-pay | Admitting: Internal Medicine

## 2014-08-11 ENCOUNTER — Other Ambulatory Visit: Payer: Self-pay

## 2014-08-11 DIAGNOSIS — D509 Iron deficiency anemia, unspecified: Secondary | ICD-10-CM

## 2014-08-21 ENCOUNTER — Other Ambulatory Visit: Payer: Self-pay | Admitting: Internal Medicine

## 2014-08-21 ENCOUNTER — Other Ambulatory Visit: Payer: Self-pay

## 2014-08-21 ENCOUNTER — Ambulatory Visit (INDEPENDENT_AMBULATORY_CARE_PROVIDER_SITE_OTHER): Payer: Medicare Other | Admitting: *Deleted

## 2014-08-21 DIAGNOSIS — Z5181 Encounter for therapeutic drug level monitoring: Secondary | ICD-10-CM

## 2014-08-21 DIAGNOSIS — I4891 Unspecified atrial fibrillation: Secondary | ICD-10-CM | POA: Diagnosis not present

## 2014-08-21 DIAGNOSIS — Z7901 Long term (current) use of anticoagulants: Secondary | ICD-10-CM | POA: Diagnosis not present

## 2014-08-21 LAB — POCT INR: INR: 2.4

## 2014-08-21 MED ORDER — SPIRONOLACTONE 25 MG PO TABS: ORAL_TABLET | ORAL | Status: DC

## 2014-08-26 ENCOUNTER — Other Ambulatory Visit: Payer: Self-pay | Admitting: Internal Medicine

## 2014-09-02 DIAGNOSIS — D509 Iron deficiency anemia, unspecified: Secondary | ICD-10-CM | POA: Diagnosis not present

## 2014-09-03 LAB — IRON AND TIBC
%SAT: 18 % — AB (ref 20–55)
Iron: 64 ug/dL (ref 42–145)
TIBC: 358 ug/dL (ref 250–470)
UIBC: 294 ug/dL (ref 125–400)

## 2014-09-03 LAB — CBC WITH DIFFERENTIAL/PLATELET
Basophils Absolute: 0 10*3/uL (ref 0.0–0.1)
Basophils Relative: 1 % (ref 0–1)
EOS ABS: 0.2 10*3/uL (ref 0.0–0.7)
EOS PCT: 5 % (ref 0–5)
HCT: 44.9 % (ref 36.0–46.0)
HEMOGLOBIN: 14.3 g/dL (ref 12.0–15.0)
LYMPHS ABS: 1.2 10*3/uL (ref 0.7–4.0)
Lymphocytes Relative: 25 % (ref 12–46)
MCH: 28 pg (ref 26.0–34.0)
MCHC: 31.8 g/dL (ref 30.0–36.0)
MCV: 88 fL (ref 78.0–100.0)
MONO ABS: 0.4 10*3/uL (ref 0.1–1.0)
MPV: 10.1 fL (ref 8.6–12.4)
Monocytes Relative: 9 % (ref 3–12)
Neutro Abs: 2.8 10*3/uL (ref 1.7–7.7)
Neutrophils Relative %: 60 % (ref 43–77)
Platelets: 199 10*3/uL (ref 150–400)
RBC: 5.1 MIL/uL (ref 3.87–5.11)
RDW: 17.3 % — ABNORMAL HIGH (ref 11.5–15.5)
WBC: 4.7 10*3/uL (ref 4.0–10.5)

## 2014-09-03 LAB — FERRITIN: FERRITIN: 21 ng/mL (ref 10–291)

## 2014-09-05 ENCOUNTER — Ambulatory Visit (INDEPENDENT_AMBULATORY_CARE_PROVIDER_SITE_OTHER): Payer: Medicare Other | Admitting: Internal Medicine

## 2014-09-05 ENCOUNTER — Encounter: Payer: Self-pay | Admitting: Internal Medicine

## 2014-09-05 VITALS — BP 138/82 | HR 76 | Temp 98.3°F | Ht 68.0 in | Wt 217.6 lb

## 2014-09-05 DIAGNOSIS — K219 Gastro-esophageal reflux disease without esophagitis: Secondary | ICD-10-CM | POA: Diagnosis not present

## 2014-09-05 DIAGNOSIS — Z86010 Personal history of colonic polyps: Secondary | ICD-10-CM

## 2014-09-05 NOTE — Progress Notes (Signed)
Primary Care Physician:  Delphina Cahill, MD Primary Gastroenterologist:  Dr. Gala Romney  Pre-Procedure History & Physical: HPI:  [name] [name] is a 74 y.o. female here for followup of mixed anemia, history of colonic polyp.   H. Pylori treated. Follow-up colonoscopy last year reassuring. Remaining polyps removed. H. Pylori eradication proven by stool antigen testing. Clinically, she is doing well; she has had no any melena or hematochezia; no nausea vomiting or hematemesis. Recent H&H 14.3 and 44.9 MCV 88 serum iron 64 total iron binding capacity 358% saturation 18 ferritin 21. She is really having no GI issues at this time although she has chronic early satiety to some degree-just cannot eat big meals. I note that her weight is actually up 2 pounds since her last visit.  Past Medical History  Diagnosis Date  . Nonischemic cardiomyopathy 1998    EF of 25% in 1998, 15% in 2006; catheterization in 3/07-50% D1; luminal      irregularities in the other vessels; PA-65/25. 20% EF in 04/2007 with moderate MR and pulmonary htn; AICD/biventricular pacing--05/2005  . Hypertension   . Atrial fibrillation     plus nonsustained ventricular tachycardia --> amiodarone treatment  . Nonsustained ventricular tachycardia   . OSA (obstructive sleep apnea)     no sleep machine  . Chronic kidney disease, stage 4, severely decreased GFR     Creatinine of 2.1 in 09/2008, 1.9 in 11/1998 and, 2.3 in 05/2010  . Venous stasis   . Gout     Uric acid of 12.5 in 03/2009  . Chronic anticoagulation   . History of lupus   . CHF (congestive heart failure)   . Chronic kidney disease     Past Surgical History  Procedure Laterality Date  . Appendectomy  1987  . Incision and drainage intra oral abscess  1991    submandibular abscess  . Cardiac defibrillator placement  05/2005    Medtronic Insync, Biventricular  . Colonoscopy with esophagogastroduodenoscopy (egd) N/A 03/20/2013    RMR: Gastric erosions and nodularity as  described above s/p bx s/p hemostasis cliping/Rectal polyp-removed as described above. Polyp at ileocecal valve and cecum requiring saline/epinephrine injection assistance piecemeal polypectomy with subsequent APC ablation and clip. Stomach biopsy showed mild gastritis with H. pylori. 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 Rectum TA  . Colonoscopy N/A 12/09/2013    RMR: The site of larger tubulovillous adenomas about the ileocecal valve and cecum looked good with no residual polyp tissue at those locations. Multiple smaller polyps throughout the colon as outlined above treated/removed as described above. tubular adenomas. next TCS 11/2016    Prior to Admission medications   Medication Sig Start Date End Date Taking? Authorizing Provider  acetaminophen (TYLENOL) 500 MG tablet Take 500 mg by mouth every 6 (six) hours as needed (pain). For pain   Yes Historical Provider, MD  allopurinol (ZYLOPRIM) 100 MG tablet Take 100 mg by mouth daily.    Yes Historical Provider, MD  amiodarone (PACERONE) 200 MG tablet TAKE ONE TABLET BY MOUTH ONCE DAILY MONDAY THROUGH FRIDAY, AND 1/2 TABLET SATURDAY AND SUNDAY. 02/03/14  Yes Evans Lance, MD  buPROPion (WELLBUTRIN SR) 100 MG 12 hr tablet Take 1 tablet (100 mg total) by mouth daily. Patient taking differently: Take 300 mg by mouth daily.  08/30/12  Yes Evans Lance, MD  carvedilol (COREG) 25 MG tablet Take 25 mg by mouth 2 (two) times daily with a meal.   Yes Historical Provider, MD  cholecalciferol (VITAMIN D) 1000 UNITS tablet Take 1,000 Units by mouth daily.   Yes Historical Provider, MD  digoxin (LANOXIN) 0.125 MG tablet Take 1 tablet (125 mcg total) by mouth every other day. 12/12/13  Yes Evans Lance, MD  furosemide (LASIX) 40 MG tablet Take 40 mg by mouth 2 (two) times daily.   Yes Historical Provider, MD  IRON PO Take 150 mg by mouth daily.   Yes Historical Provider, MD  pantoprazole (PROTONIX) 40 MG tablet Take 1 tablet (40 mg  total) by mouth daily. 04/14/14  Yes Mahala Menghini, PA-C  spironolactone (ALDACTONE) 25 MG tablet TAKE ONE TABLET BY MOUTH ONCE DAILY IN THE MORNING. 08/21/14  Yes Evans Lance, MD  warfarin (COUMADIN) 5 MG tablet Take 1 tablet daily except 1 1/2 tablets on Thursdays or as directed 08/26/14  Yes Evans Lance, MD    Allergies as of 09/05/2014 - Review Complete 09/05/2014  Allergen Reaction Noted  . Penicillins Other (See Comments)     Family History  Problem Relation Age of Onset  . Heart attack Mother 25  . Kidney failure Father 49  . Heart attack Brother   . Colon cancer Neg Hx     Social History   Social History  . Marital Status: Single    Spouse Name: N/A  . Number of Children:  0  . Years of Education: N/A   Occupational History  . Retired     Environmental manager work; Programmer, applications   Social History Main Topics  . Smoking status: Current Every Day Smoker -- 0.50 packs/day for 30 years    Types: Cigarettes  . Smokeless tobacco: Never Used     Comment: pt states that she is trying to quit/ 1/2 pack daily  . Alcohol Use: No  . Drug Use: No  . Sexual Activity: Not on file   Other Topics Concern  . Not on file   Social History Narrative   Resides with a niece in [place] of Systems: See HPI, otherwise negative ROS  Physical Exam: BP 138/82 mmHg  Pulse 76  Temp(Src) 98.3 F (36.8 C) (Oral)  Ht 5\' 8"  (1.727 m)  Wt 217 lb 9.6 oz (98.703 kg)  BMI 33.09 kg/m2 General:   Alert,  Well-developed, well-nourished, pleasant and cooperative in NAD Skin:  Intact without significant lesions or rashes. Eyes:  Sclera clear, no icterus.   Conjunctiva pink. Ears:  Normal auditory acuity. Nose:  No deformity, discharge,  or lesions. Mouth:  No deformity or lesions. Neck:  Supple; no masses or thyromegaly. No significant cervical adenopathy. Lungs:  Clear throughout to auscultation.   No wheezes, crackles, or rhonchi. No acute distress. Heart:  Regular rate and rhythm; no  murmurs, clicks, rubs,  or gallops. Abdomen: Non-distended, normal bowel sounds.  Soft and nontender without appreciable mass or hepatosplenomegaly. No succussion splash Pulses:  Normal pulses noted. Extremities:  Without clubbing or edema.  Impression:   Pleasant 74 year old lady with multiple medical problems including a mixed anemia (iron deficiency/anemia of chronic disease). Status post eradication of H. Pylori. History of multiple colonic polyps removed from her colon last year-last;  followup colonoscopy reassuring. She will be due for surveillance examination in about 2 years. GERD symptoms well controlled on pantoprazole. She has vague early satiety but this is chronic. She is holding her weight. Findings of EGD last year reassuring as well.  Hemoglobin now normal on iron supplementation. We talked about the risks versus  benefits of acid suppression therapy. As long as she is on Coumadin, I feel that once daily PPI should be continued.  Evaluation of her small bowel has not been done because of multiple issues (presence of a defibrillator and advanced chronic kidney disease).  Patient not desirous of further evaluation unless absolutely necessary.  Recommendations:    Continue iron supplementation  Continue Pantoprazole 40 mg daily  Office visit with [place] in 1 year      Notice: This dictation was prepared with Dragon dictation along with smaller phrase technology. Any transcriptional errors that result from this process are unintentional and may not be corrected upon review.

## 2014-09-05 NOTE — Patient Instructions (Signed)
Continue iron supplementation  Continue Pantoprazole 40 mg daily  Office visit with [place] in 1 year

## 2014-09-15 DIAGNOSIS — I10 Essential (primary) hypertension: Secondary | ICD-10-CM | POA: Diagnosis not present

## 2014-09-15 DIAGNOSIS — R809 Proteinuria, unspecified: Secondary | ICD-10-CM | POA: Diagnosis not present

## 2014-09-15 DIAGNOSIS — D649 Anemia, unspecified: Secondary | ICD-10-CM | POA: Diagnosis not present

## 2014-09-15 DIAGNOSIS — Z79899 Other long term (current) drug therapy: Secondary | ICD-10-CM | POA: Diagnosis not present

## 2014-09-15 DIAGNOSIS — E559 Vitamin D deficiency, unspecified: Secondary | ICD-10-CM | POA: Diagnosis not present

## 2014-09-15 DIAGNOSIS — N183 Chronic kidney disease, stage 3 (moderate): Secondary | ICD-10-CM | POA: Diagnosis not present

## 2014-09-16 DIAGNOSIS — N2581 Secondary hyperparathyroidism of renal origin: Secondary | ICD-10-CM | POA: Diagnosis not present

## 2014-09-16 DIAGNOSIS — N184 Chronic kidney disease, stage 4 (severe): Secondary | ICD-10-CM | POA: Diagnosis not present

## 2014-09-16 DIAGNOSIS — I10 Essential (primary) hypertension: Secondary | ICD-10-CM | POA: Diagnosis not present

## 2014-09-16 NOTE — Progress Notes (Signed)
Quick Note:  Dr. Gala Romney addressed at [facility]. ______

## 2014-09-18 ENCOUNTER — Ambulatory Visit (INDEPENDENT_AMBULATORY_CARE_PROVIDER_SITE_OTHER): Payer: Medicare Other | Admitting: *Deleted

## 2014-09-18 DIAGNOSIS — I4891 Unspecified atrial fibrillation: Secondary | ICD-10-CM | POA: Diagnosis not present

## 2014-09-18 DIAGNOSIS — Z7901 Long term (current) use of anticoagulants: Secondary | ICD-10-CM | POA: Diagnosis not present

## 2014-09-18 DIAGNOSIS — Z5181 Encounter for therapeutic drug level monitoring: Secondary | ICD-10-CM | POA: Diagnosis not present

## 2014-09-18 LAB — POCT INR: INR: 5.1

## 2014-09-22 ENCOUNTER — Encounter: Payer: Self-pay | Admitting: *Deleted

## 2014-09-30 ENCOUNTER — Encounter: Payer: Self-pay | Admitting: *Deleted

## 2014-09-30 ENCOUNTER — Other Ambulatory Visit: Payer: Self-pay | Admitting: Gastroenterology

## 2014-10-02 ENCOUNTER — Ambulatory Visit (INDEPENDENT_AMBULATORY_CARE_PROVIDER_SITE_OTHER): Payer: Medicare Other | Admitting: *Deleted

## 2014-10-02 DIAGNOSIS — Z5181 Encounter for therapeutic drug level monitoring: Secondary | ICD-10-CM | POA: Diagnosis not present

## 2014-10-02 DIAGNOSIS — Z7901 Long term (current) use of anticoagulants: Secondary | ICD-10-CM | POA: Diagnosis not present

## 2014-10-02 DIAGNOSIS — I4891 Unspecified atrial fibrillation: Secondary | ICD-10-CM | POA: Diagnosis not present

## 2014-10-02 LAB — POCT INR: INR: 3.3

## 2014-10-06 ENCOUNTER — Other Ambulatory Visit: Payer: Self-pay | Admitting: Internal Medicine

## 2014-10-22 ENCOUNTER — Other Ambulatory Visit: Payer: Self-pay | Admitting: Internal Medicine

## 2014-10-22 ENCOUNTER — Encounter: Payer: Self-pay | Admitting: *Deleted

## 2014-10-23 ENCOUNTER — Ambulatory Visit (INDEPENDENT_AMBULATORY_CARE_PROVIDER_SITE_OTHER): Payer: Medicare Other | Admitting: *Deleted

## 2014-10-23 DIAGNOSIS — Z7901 Long term (current) use of anticoagulants: Secondary | ICD-10-CM

## 2014-10-23 DIAGNOSIS — I4891 Unspecified atrial fibrillation: Secondary | ICD-10-CM

## 2014-10-23 DIAGNOSIS — Z5181 Encounter for therapeutic drug level monitoring: Secondary | ICD-10-CM

## 2014-10-23 LAB — PROTIME-INR
INR: 6.2 — AB (ref 0.8–1.2)
PROTHROMBIN TIME: 67.8 s — AB (ref 9.1–12.0)

## 2014-10-23 LAB — POCT INR: INR: >8

## 2014-10-28 ENCOUNTER — Ambulatory Visit (INDEPENDENT_AMBULATORY_CARE_PROVIDER_SITE_OTHER): Payer: Medicare Other | Admitting: *Deleted

## 2014-10-28 DIAGNOSIS — I4891 Unspecified atrial fibrillation: Secondary | ICD-10-CM

## 2014-10-28 DIAGNOSIS — Z5181 Encounter for therapeutic drug level monitoring: Secondary | ICD-10-CM | POA: Diagnosis not present

## 2014-10-28 LAB — POCT INR: INR: 3

## 2014-11-06 ENCOUNTER — Ambulatory Visit (INDEPENDENT_AMBULATORY_CARE_PROVIDER_SITE_OTHER): Payer: Medicare Other | Admitting: *Deleted

## 2014-11-06 DIAGNOSIS — Z7901 Long term (current) use of anticoagulants: Secondary | ICD-10-CM

## 2014-11-06 DIAGNOSIS — Z5181 Encounter for therapeutic drug level monitoring: Secondary | ICD-10-CM

## 2014-11-06 DIAGNOSIS — I4891 Unspecified atrial fibrillation: Secondary | ICD-10-CM | POA: Diagnosis not present

## 2014-11-06 LAB — POCT INR: INR: 3

## 2014-11-11 ENCOUNTER — Telehealth: Payer: Self-pay | Admitting: Internal Medicine

## 2014-11-11 DIAGNOSIS — N189 Chronic kidney disease, unspecified: Secondary | ICD-10-CM

## 2014-11-11 DIAGNOSIS — D631 Anemia in chronic kidney disease: Secondary | ICD-10-CM

## 2014-11-11 DIAGNOSIS — R5383 Other fatigue: Secondary | ICD-10-CM

## 2014-11-11 NOTE — Telephone Encounter (Signed)
Patient called and wants to speak with RMR on LSL only. I told her that I would put a note in for the nurse to call her back, but she is insisting on speaking with LSL if RMR wasn't in the office. I asked was it regarding medicines or if she was having problems. She was seen in August and is to follow up in one year. She said she was having problems and wouldn't go in detail. I asked if I could make her an OV, but she wants to speak with LSL first. Please advise and call patient at [phone]

## 2014-11-11 NOTE — Telephone Encounter (Signed)
Difficult historian. Felt like she had the "stomach flu" which started two weeks ago. Denies N/V/D. Noted difficulty with constipation so started stool softener but that has now resolved. NOW she is complaining of feeling in weak in legs over past two weeks. Could hardly walk and can hardly stand up. Pain top part of back which is new. Now with swelling/tightness in bottom part of legs but denies weight gain, weighs regularly at home. Continue poor appetite.    No recent antibiotics or recent vaccinations.   Has not contacted PCP.  Encouraged her to do so but she declined right now.   Check basic labs: TSH, Met-7, CBC. Orders done. If symptoms worse, can go to ER.  If labs unremarkable, then will recommend PCP evaluation.

## 2014-11-12 DIAGNOSIS — D631 Anemia in chronic kidney disease: Secondary | ICD-10-CM | POA: Diagnosis not present

## 2014-11-12 DIAGNOSIS — R5383 Other fatigue: Secondary | ICD-10-CM | POA: Diagnosis not present

## 2014-11-12 DIAGNOSIS — N189 Chronic kidney disease, unspecified: Secondary | ICD-10-CM | POA: Diagnosis not present

## 2014-11-13 ENCOUNTER — Telehealth: Payer: Self-pay | Admitting: Gastroenterology

## 2014-11-13 ENCOUNTER — Encounter (HOSPITAL_COMMUNITY): Payer: Self-pay | Admitting: *Deleted

## 2014-11-13 ENCOUNTER — Emergency Department (HOSPITAL_COMMUNITY): Payer: Medicare Other

## 2014-11-13 ENCOUNTER — Observation Stay (HOSPITAL_COMMUNITY)
Admission: EM | Admit: 2014-11-13 | Discharge: 2014-11-15 | Disposition: A | Discharge status: Home / Self Care | Payer: Medicare Other | Attending: Internal Medicine | Admitting: Internal Medicine

## 2014-11-13 DIAGNOSIS — I472 Ventricular tachycardia: Secondary | ICD-10-CM | POA: Diagnosis not present

## 2014-11-13 DIAGNOSIS — I129 Hypertensive chronic kidney disease with stage 1 through stage 4 chronic kidney disease, or unspecified chronic kidney disease: Secondary | ICD-10-CM | POA: Diagnosis not present

## 2014-11-13 DIAGNOSIS — M109 Gout, unspecified: Secondary | ICD-10-CM | POA: Insufficient documentation

## 2014-11-13 DIAGNOSIS — N184 Chronic kidney disease, stage 4 (severe): Secondary | ICD-10-CM

## 2014-11-13 DIAGNOSIS — Z880 Allergy status to penicillin: Secondary | ICD-10-CM | POA: Diagnosis not present

## 2014-11-13 DIAGNOSIS — I482 Chronic atrial fibrillation: Secondary | ICD-10-CM

## 2014-11-13 DIAGNOSIS — Z79899 Other long term (current) drug therapy: Secondary | ICD-10-CM | POA: Diagnosis not present

## 2014-11-13 DIAGNOSIS — G4733 Obstructive sleep apnea (adult) (pediatric): Secondary | ICD-10-CM | POA: Diagnosis not present

## 2014-11-13 DIAGNOSIS — I10 Essential (primary) hypertension: Secondary | ICD-10-CM | POA: Diagnosis not present

## 2014-11-13 DIAGNOSIS — D638 Anemia in other chronic diseases classified elsewhere: Secondary | ICD-10-CM

## 2014-11-13 DIAGNOSIS — N289 Disorder of kidney and ureter, unspecified: Secondary | ICD-10-CM | POA: Insufficient documentation

## 2014-11-13 DIAGNOSIS — R110 Nausea: Secondary | ICD-10-CM | POA: Insufficient documentation

## 2014-11-13 DIAGNOSIS — Z720 Tobacco use: Secondary | ICD-10-CM | POA: Diagnosis not present

## 2014-11-13 DIAGNOSIS — R531 Weakness: Secondary | ICD-10-CM | POA: Diagnosis not present

## 2014-11-13 DIAGNOSIS — I429 Cardiomyopathy, unspecified: Secondary | ICD-10-CM | POA: Diagnosis not present

## 2014-11-13 DIAGNOSIS — D649 Anemia, unspecified: Secondary | ICD-10-CM | POA: Diagnosis not present

## 2014-11-13 DIAGNOSIS — R0602 Shortness of breath: Secondary | ICD-10-CM | POA: Insufficient documentation

## 2014-11-13 DIAGNOSIS — I4891 Unspecified atrial fibrillation: Secondary | ICD-10-CM | POA: Diagnosis not present

## 2014-11-13 DIAGNOSIS — Z23 Encounter for immunization: Secondary | ICD-10-CM | POA: Insufficient documentation

## 2014-11-13 DIAGNOSIS — Z7901 Long term (current) use of anticoagulants: Secondary | ICD-10-CM | POA: Insufficient documentation

## 2014-11-13 DIAGNOSIS — R7989 Other specified abnormal findings of blood chemistry: Secondary | ICD-10-CM | POA: Diagnosis present

## 2014-11-13 HISTORY — DX: Anemia, unspecified: D64.9

## 2014-11-13 LAB — PROTIME-INR
INR: 3.18 — AB (ref 0.00–1.49)
PROTHROMBIN TIME: 32 s — AB (ref 11.6–15.2)

## 2014-11-13 LAB — BASIC METABOLIC PANEL
Anion gap: 7 (ref 5–15)
BUN / CREAT RATIO: 19 (ref 11–26)
BUN: 48 mg/dL — ABNORMAL HIGH (ref 8–27)
BUN: 51 mg/dL — AB (ref 6–20)
CHLORIDE: 99 mmol/L — AB (ref 101–111)
CO2: 25 mmol/L (ref 18–29)
CO2: 28 mmol/L (ref 22–32)
CREATININE: 2.76 mg/dL — AB (ref 0.44–1.00)
Calcium: 9.2 mg/dL (ref 8.7–10.3)
Calcium: 8.6 mg/dL — ABNORMAL LOW (ref 8.9–10.3)
Chloride: 96 mmol/L — ABNORMAL LOW (ref 97–106)
Creatinine, Ser: 2.59 mg/dL — ABNORMAL HIGH (ref 0.57–1.00)
GFR calc Af Amer: 21 mL/min/{1.73_m2} — ABNORMAL LOW (ref >59)
GFR calc non Af Amer: 16 mL/min — ABNORMAL LOW (ref >60)
GFR, EST AFRICAN AMERICAN: 19 mL/min — AB (ref >60)
GFR, EST NON AFRICAN AMERICAN: 18 mL/min/{1.73_m2} — AB (ref >59)
Glucose, Bld: 108 mg/dL — ABNORMAL HIGH (ref 65–99)
Glucose: 102 mg/dL — ABNORMAL HIGH (ref 65–99)
POTASSIUM: 4.7 mmol/L (ref 3.5–5.2)
POTASSIUM: 4.4 mmol/L (ref 3.5–5.1)
SODIUM: 135 mmol/L — AB (ref 136–144)
SODIUM: 134 mmol/L — AB (ref 135–145)

## 2014-11-13 LAB — CBC WITH DIFFERENTIAL/PLATELET
Basophils Absolute: 0 10*3/uL (ref 0.0–0.1)
Basophils Relative: 0 %
Eosinophils Absolute: 0.2 10*3/uL (ref 0.0–0.7)
Eosinophils Relative: 2 %
HEMATOCRIT: 20.4 % — AB (ref 36.0–46.0)
HEMOGLOBIN: 5.4 g/dL — AB (ref 12.0–15.0)
LYMPHS ABS: 1.4 10*3/uL (ref 0.7–4.0)
Lymphocytes Relative: 17 %
MCH: 19.9 pg — AB (ref 26.0–34.0)
MCHC: 26.5 g/dL — AB (ref 30.0–36.0)
MCV: 75.3 fL — ABNORMAL LOW (ref 78.0–100.0)
MONOS PCT: 11 %
Monocytes Absolute: 0.9 10*3/uL (ref 0.1–1.0)
NEUTROS ABS: 5.7 10*3/uL (ref 1.7–7.7)
NEUTROS PCT: 71 %
Platelets: 436 10*3/uL — ABNORMAL HIGH (ref 150–400)
RBC: 2.71 MIL/uL — AB (ref 3.87–5.11)
RDW: 22.8 % — ABNORMAL HIGH (ref 11.5–15.5)
WBC: 8.1 10*3/uL (ref 4.0–10.5)

## 2014-11-13 LAB — DIGOXIN LEVEL: Digoxin Level: 1.3 ng/mL (ref 0.8–2.0)

## 2014-11-13 LAB — CBC
Hematocrit: 20.5 % — ABNORMAL LOW (ref 34.0–46.6)
Hemoglobin: 5.5 g/dL — CL (ref 11.1–15.9)
MCH: 19.8 pg — AB (ref 26.6–33.0)
MCHC: 26.8 g/dL — AB (ref 31.5–35.7)
MCV: 74 fL — ABNORMAL LOW (ref 79–97)
PLATELETS: 420 10*3/uL — AB (ref 150–379)
RBC: 2.78 x10E6/uL — AB (ref 3.77–5.28)
RDW: 22.6 % — AB (ref 12.3–15.4)
WBC: 8.3 10*3/uL (ref 3.4–10.8)

## 2014-11-13 LAB — RETICULOCYTES
RBC.: 2.56 MIL/uL — ABNORMAL LOW (ref 3.87–5.11)
Retic Count, Absolute: 46.1 10*3/uL (ref 19.0–186.0)
Retic Ct Pct: 1.8 % (ref 0.4–3.1)

## 2014-11-13 LAB — ABO/RH: ABO/RH(D): O POS

## 2014-11-13 LAB — IRON AND TIBC
IRON: 11 ug/dL — AB (ref 28–170)
SATURATION RATIOS: 2 % — AB (ref 10.4–31.8)
TIBC: 510 ug/dL — AB (ref 250–450)
UIBC: 499 ug/dL

## 2014-11-13 LAB — PREPARE RBC (CROSSMATCH)

## 2014-11-13 LAB — POC OCCULT BLOOD, ED: Fecal Occult Bld: NEGATIVE

## 2014-11-13 LAB — FERRITIN: FERRITIN: 5 ng/mL — AB (ref 11–307)

## 2014-11-13 LAB — TSH: TSH: 1.5 u[IU]/mL (ref 0.450–4.500)

## 2014-11-13 LAB — FOLATE: FOLATE: 11.3 ng/mL (ref >5.9)

## 2014-11-13 LAB — VITAMIN B12: Vitamin B-12: 438 pg/mL (ref 180–914)

## 2014-11-13 MED ORDER — DIGOXIN 125 MCG PO TABS
125.0000 ug | ORAL_TABLET | ORAL | Status: DC
  Administered 2014-11-14: 125 ug via ORAL
  Filled 2014-11-13 (×2): qty 1

## 2014-11-13 MED ORDER — ACETAMINOPHEN 650 MG RE SUPP: 650.0000 mg | Freq: Four times a day (QID) | RECTAL | Status: DC | PRN

## 2014-11-13 MED ORDER — BUPROPION HCL ER (SR) 100 MG PO TB12
100.0000 mg | ORAL_TABLET | Freq: Every day | ORAL | Status: DC
  Administered 2014-11-13 – 2014-11-14 (×2): 100 mg via ORAL
  Filled 2014-11-13 (×5): qty 1

## 2014-11-13 MED ORDER — VITAMIN D 1000 UNITS PO TABS
1000.0000 [IU] | ORAL_TABLET | Freq: Every day | ORAL | Status: DC
  Administered 2014-11-13 – 2014-11-14 (×2): 1000 [IU] via ORAL
  Filled 2014-11-13 (×2): qty 1

## 2014-11-13 MED ORDER — SODIUM CHLORIDE 0.9 % IV SOLN
INTRAVENOUS | Status: DC
  Administered 2014-11-13: 22:00:00 via INTRAVENOUS

## 2014-11-13 MED ORDER — ENSURE ENLIVE PO LIQD
237.0000 mL | Freq: Two times a day (BID) | ORAL | Status: DC
  Administered 2014-11-13 – 2014-11-14 (×2): 237 mL via ORAL

## 2014-11-13 MED ORDER — SPIRONOLACTONE 25 MG PO TABS
25.0000 mg | ORAL_TABLET | Freq: Every day | ORAL | Status: DC
  Administered 2014-11-13 – 2014-11-14 (×2): 25 mg via ORAL
  Filled 2014-11-13 (×2): qty 1

## 2014-11-13 MED ORDER — ACETAMINOPHEN 325 MG PO TABS
650.0000 mg | ORAL_TABLET | Freq: Four times a day (QID) | ORAL | Status: DC | PRN
  Administered 2014-11-14: 650 mg via ORAL
  Filled 2014-11-13: qty 2

## 2014-11-13 MED ORDER — CARVEDILOL 12.5 MG PO TABS
25.0000 mg | ORAL_TABLET | Freq: Two times a day (BID) | ORAL | Status: DC
  Administered 2014-11-13 – 2014-11-14 (×3): 25 mg via ORAL
  Filled 2014-11-13 (×3): qty 2

## 2014-11-13 MED ORDER — SENNOSIDES-DOCUSATE SODIUM 8.6-50 MG PO TABS: 1.0000 | ORAL_TABLET | Freq: Every evening | ORAL | Status: DC | PRN

## 2014-11-13 MED ORDER — AMIODARONE HCL 200 MG PO TABS
200.0000 mg | ORAL_TABLET | Freq: Every day | ORAL | Status: DC
  Administered 2014-11-13 – 2014-11-14 (×2): 200 mg via ORAL
  Filled 2014-11-13 (×2): qty 1

## 2014-11-13 MED ORDER — SODIUM CHLORIDE 0.9 % IV SOLN
10.0000 mL/h | Freq: Once | INTRAVENOUS | Status: AC
  Administered 2014-11-13: 10 mL/h via INTRAVENOUS

## 2014-11-13 MED ORDER — FERROUS SULFATE 325 (65 FE) MG PO TABS
325.0000 mg | ORAL_TABLET | Freq: Two times a day (BID) | ORAL | Status: DC
  Administered 2014-11-13 – 2014-11-14 (×3): 325 mg via ORAL
  Filled 2014-11-13 (×3): qty 1

## 2014-11-13 MED ORDER — WARFARIN - PHARMACIST DOSING INPATIENT: Status: DC

## 2014-11-13 MED ORDER — ALLOPURINOL 100 MG PO TABS
100.0000 mg | ORAL_TABLET | Freq: Every day | ORAL | Status: DC
  Administered 2014-11-13 – 2014-11-14 (×2): 100 mg via ORAL
  Filled 2014-11-13 (×2): qty 1

## 2014-11-13 MED ORDER — FUROSEMIDE 40 MG PO TABS
40.0000 mg | ORAL_TABLET | Freq: Two times a day (BID) | ORAL | Status: DC
  Administered 2014-11-13 – 2014-11-14 (×3): 40 mg via ORAL
  Filled 2014-11-13 (×3): qty 1

## 2014-11-13 MED ORDER — PANTOPRAZOLE SODIUM 40 MG PO TBEC
40.0000 mg | DELAYED_RELEASE_TABLET | Freq: Every day | ORAL | Status: DC
  Administered 2014-11-13 – 2014-11-14 (×2): 40 mg via ORAL
  Filled 2014-11-13 (×3): qty 1

## 2014-11-13 NOTE — ED Notes (Signed)
Pt states she called her doctor yesterday because she felt real tired, her feet were swelling and she didn't want to eat. States she has also had some nausea but no vomiting. States she had blood work drawn yesterday and was called this morning to come to the ED because her hemoglobin was low

## 2014-11-13 NOTE — ED Provider Notes (Signed)
CSN: [id number]     Arrival date & time 11/13/14  8921 History  By signing my name below, I, Hansel Feinstein, attest that this documentation has been prepared under the direction and in the presence of Ripley Fraise, MD. Electronically Signed: Hansel Feinstein, ED Scribe. 11/13/2014. 10:05 AM.    Chief Complaint  Patient presents with  . Abnormal Lab   The history is provided by the patient. No language interpreter was used.   HPI Comments: [name] [name] is a 74 y.o. female referred to the ED by her GI with h/o NICM, HTN, Afib, venous stasis, chronic anticoagulation, lupus, CHF, CKD stage IV, anemia who presents to the Emergency Department due to a HGB of 5. Pt states associated nausea, exertional weakness and SOB. Pt notes that her HGB has recently been maintained at 96. Pt is not O2 dependent at home. PCP is Dr. [place] Crane. She denies fevers, vomiting, bloody stools, melena, hematuria, vaginal bleeding, CP, abdominal pain, syncope.   Pt reports she is worsening Exertion worsens her symptoms   Past Medical History  Diagnosis Date  . Nonischemic cardiomyopathy ([place]) 1998    EF of 25% in 1998, 15% in 2006; catheterization in 3/07-50% D1; luminal      irregularities in the other vessels; PA-65/25. 20% EF in 04/2007 with moderate MR and pulmonary htn; AICD/biventricular pacing--05/2005  . Hypertension   . Atrial fibrillation ([facility])     plus nonsustained ventricular tachycardia --> amiodarone treatment  . Nonsustained ventricular tachycardia ([place])   . OSA (obstructive sleep apnea)     no sleep machine  . Venous stasis   . Gout     Uric acid of 12.5 in 03/2009  . Chronic anticoagulation   . History of lupus   . CHF (congestive heart failure) ([place])   . Chronic kidney disease, stage 4, severely decreased GFR ([facility])     Creatinine of 2.1 in 09/2008, 1.9 in 11/1998 and, 2.3 in 05/2010  . Chronic kidney disease   . Anemia    Past Surgical History  Procedure Laterality Date  . Appendectomy  1987  .  Incision and drainage intra oral abscess  1991    submandibular abscess  . Cardiac defibrillator placement  05/2005    Medtronic Insync, Biventricular  . Colonoscopy with esophagogastroduodenoscopy (egd) N/A 03/20/2013    RMR: Gastric erosions and nodularity as described above s/p bx s/p hemostasis cliping/Rectal polyp-removed as described above. Polyp at ileocecal valve and cecum requiring saline/epinephrine injection assistance piecemeal polypectomy with subsequent APC ablation and clip. Stomach biopsy showed mild gastritis with H. pylori. 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 Rectum TA  . Colonoscopy N/A 12/09/2013    RMR: The site of larger tubulovillous adenomas about the ileocecal valve and cecum looked good with no residual polyp tissue at those locations. Multiple smaller polyps throughout the colon as outlined above treated/removed as described above. tubular adenomas. next TCS 11/2016   Family History  Problem Relation Age of Onset  . Heart attack Mother 42  . Kidney failure Father 58  . Heart attack Brother   . Colon cancer Neg Hx    Social History  Substance Use Topics  . Smoking status: Current Every Day Smoker -- 0.50 packs/day for 30 years    Types: Cigarettes  . Smokeless tobacco: Never Used     Comment: pt states that she is trying to quit/ 1/2 pack daily  . Alcohol Use: No   OB History    No data available  Review of Systems  Constitutional: Negative for fever.  Respiratory: Positive for shortness of breath.   Cardiovascular: Negative for chest pain.  Gastrointestinal: Positive for nausea. Negative for vomiting, abdominal pain and blood in stool.  Genitourinary: Negative for hematuria and vaginal bleeding.  Neurological: Positive for weakness. Negative for syncope.  All other systems reviewed and are negative.  Allergies  Penicillins  Home Medications   Prior to Admission medications   Medication Sig Start Date End Date Taking?  Authorizing Provider  acetaminophen (TYLENOL) 500 MG tablet Take 500 mg by mouth every 6 (six) hours as needed (pain). For pain    Historical Provider, MD  allopurinol (ZYLOPRIM) 100 MG tablet Take 100 mg by mouth daily.     Historical Provider, MD  amiodarone (PACERONE) 200 MG tablet TAKE ONE TABLET BY MOUTH ONCE DAILY MONDAY THROUGH FRIDAY, AND 1/2 TABLET SATURDAY AND SUNDAY. 02/03/14   Evans Lance, MD  buPROPion (WELLBUTRIN SR) 100 MG 12 hr tablet Take 1 tablet (100 mg total) by mouth daily. Patient taking differently: Take 300 mg by mouth daily.  08/30/12   Evans Lance, MD  carvedilol (COREG) 25 MG tablet Take 25 mg by mouth 2 (two) times daily with a meal.    Historical Provider, MD  cholecalciferol (VITAMIN D) 1000 UNITS tablet Take 1,000 Units by mouth daily.    Historical Provider, MD  digoxin (LANOXIN) 0.125 MG tablet Take 1 tablet (125 mcg total) by mouth every other day. 12/12/13   Evans Lance, MD  furosemide (LASIX) 40 MG tablet Take 40 mg by mouth 2 (two) times daily.    Historical Provider, MD  IRON PO Take 150 mg by mouth daily.    Historical Provider, MD  pantoprazole (PROTONIX) 40 MG tablet TAKE 1 TABLET BY MOUTH ONCE DAILY. 10/01/14   Carlis Stable, NP  spironolactone (ALDACTONE) 25 MG tablet TAKE ONE TABLET BY MOUTH ONCE DAILY IN THE MORNING. 10/22/14   Evans Lance, MD  warfarin (COUMADIN) 5 MG tablet Take 1 tablet daily except 1 1/2 tablets on Thursdays or as directed 08/26/14   Evans Lance, MD   BP 132/58 mmHg  Pulse 78  Temp(Src) 98.1 F (36.7 C) (Oral)  Resp 22  Ht 5\' 8"  (1.727 m)  Wt 217 lb (98.431 kg)  BMI 33.00 kg/m2  SpO2 92% Physical Exam CONSTITUTIONAL: Well developed/well nourished HEAD: Normocephalic/atraumatic EYES: EOMI/PERRL. conjunctiva pale.  ENMT: Mucous membranes moist NECK: supple no meningeal signs CV: S1/S2 noted, no murmurs/rubs/gallops noted LUNGS: Lungs are clear to auscultation bilaterally, no apparent distress ABDOMEN: soft,  nontender, no rebound or guarding, bowel sounds noted throughout abdomen RECTAL: Stool color normal; no melena or blood noted. Chaperone present.  NEURO: Pt is awake/alert/appropriate, moves all extremitiesx4.  No facial droop.   EXTREMITIES: pulses normal/equal, full ROM SKIN: warm, color normal PSYCH: no abnormalities of mood noted, alert and oriented to situation  ED Course  Procedures  CRITICAL CARE Performed by: Sharyon Cable Total critical care time: 35 Critical care time was exclusive of separately billable procedures and treating other patients. Critical care was necessary to treat or prevent imminent or life-threatening deterioration. Critical care was time spent personally by me on the following activities: development of treatment plan with patient and/or surrogate as well as nursing, discussions with consultants, evaluation of patient's response to treatment, examination of patient, obtaining history from patient or surrogate, ordering and performing treatments and interventions, ordering and review of laboratory studies, ordering and review of radiographic  studies, pulse oximetry and re-evaluation of patient's condition. PATIENT ANEMIA, HGB OF 5 AND REQUIRING BLOOD TRANSFUSION  DIAGNOSTIC STUDIES: Oxygen Saturation is 92% on [place] 2L/min, low by my interpretation.    COORDINATION OF CARE: 10:01 AM Discussed treatment plan with pt at bedside and pt agreed to plan.   12:22 PM PT WITH WORSENING ANEMIA IN THE SETTING OF CHRONIC RENAL INSUFFICIENCY NO SIGNS OF ACUTE BLOOD LOSS BLOOD TRANSFUSION HAS BEEN ORDERED WILL ADMIT PT AGREEABLE FOR TRANSFUSION D/W DR HERNANDEZ WILL ADMIT BP 121/62 mmHg  Pulse 70  Temp(Src) 98.1 F (36.7 C) (Oral)  Resp 18  Ht 5\' 8"  (1.727 m)  Wt 217 lb (98.431 kg)  BMI 33.00 kg/m2  SpO2 98%   Labs Review Labs Reviewed  CBC WITH DIFFERENTIAL/PLATELET - Abnormal; Notable for the following:    RBC 2.71 (*)    Hemoglobin 5.4 (*)    HCT 20.4  (*)    MCV 75.3 (*)    MCH 19.9 (*)    MCHC 26.5 (*)    RDW 22.8 (*)    Platelets 436 (*)    All other components within normal limits  BASIC METABOLIC PANEL - Abnormal; Notable for the following:    Sodium 134 (*)    Chloride 99 (*)    Glucose, Bld 108 (*)    BUN 51 (*)    Creatinine, Ser 2.76 (*)    Calcium 8.6 (*)    GFR calc non Af Amer 16 (*)    GFR calc Af Amer 19 (*)    All other components within normal limits  PROTIME-INR - Abnormal; Notable for the following:    Prothrombin Time 32.0 (*)    INR 3.18 (*)    All other components within normal limits  RETICULOCYTES - Abnormal; Notable for the following:    RBC. 2.56 (*)    All other components within normal limits  DIGOXIN LEVEL  VITAMIN B12  FOLATE  IRON AND TIBC  FERRITIN  POC OCCULT BLOOD, ED  TYPE AND SCREEN  PREPARE RBC (CROSSMATCH)  ABO/RH    Imaging Review Dg Chest Portable 1 View  11/13/2014  CLINICAL DATA:  Short of breath today, hypertension, smoking history EXAM: PORTABLE CHEST 1 VIEW COMPARISON:  Chest x-ray of 06/26/2010 FINDINGS: The lungs are not well aerated and there is moderate cardiomegaly present. Mild pulmonary vascular congestion is a consideration. AICD leads remain. IMPRESSION: Poor inspiration. Stable moderate cardiomegaly. Question mild pulmonary vascular congestion. Electronically Signed   By: Ivar Drape M.D.   On: 11/13/2014 11:18   I have personally reviewed and evaluated these lab results as part of my medical decision-making.   EKG Interpretation   Date/Time:  Thursday November 13 2014 09:46:24 EDT Ventricular Rate:  73 PR Interval:  208 QRS Duration: 130 QT Interval:  546 QTC Calculation: 602 R Axis:   -54 Text Interpretation:  Sinus rhythm ?paced rhythm, artifact limits  evaluation Nonspecific IVCD with LAD Abnormal ekg Confirmed by Christy Gentles   MD, Elenore Rota ([id number]) on 11/13/2014 9:51:01 AM     MDM   Final diagnoses:  Anemia, unspecified anemia type  Renal insufficiency     Nursing notes including past medical history and social history reviewed and considered in documentation Labs/vital reviewed myself and considered during evaluation Previous records reviewed and considered  I, Sharyon Cable, personally performed the services described in this documentation. All medical record entries made by the scribe were at my direction and in my presence.  I have reviewed the  chart and agree that the record reflects my personal performance and is accurate and complete. Sharyon Cable.  11/13/2014. 12:21 PM.       Ripley Fraise, MD 11/13/14 1224

## 2014-11-13 NOTE — ED Notes (Signed)
Patient sent over by GI for hemoglobin of 5. Patient does report fatigue and shortness of breath.

## 2014-11-13 NOTE — H&P (Signed)
[facility]          History and Physical    PCP:   Wende Neighbors, MD   EDP: Julious Oka, M.D.  Chief Complaint:  Abnormal blood count  HPI: Patient is a 74 year old lady who presents to the hospital today at the urging of her GI physician after routine blood work showed a hemoglobin of 5. She has followed routinely with Dr. Gala Romney for what appears to be a combination of anemia of chronic disease/iron deficiency anemia. She has had colonoscopies have not evidence any source of bleeding in the past. She does have advanced chronic kidney disease and this has been believed to be the source of her anemia in the past. Review of GI notes state that in August her hemoglobin was 14. Her hemoglobin today numerous department is 5.4. Upon questioning she states that she has been feeling very fatigued, has developed foot edema, has been anorexic, she has also been a little short of breath and dizzy when standing. In the ED an FOBT is negative. Past medical history significant for atrial fibrillation status post permanent pacemaker and maintained on chronic anticoagulation with Coumadin, GERD, stage IV chronic kidney disease, hypertension. We have been asked to admit her for further evaluation and management.  Allergies:   Allergies  Allergen Reactions  . Penicillins Other (See Comments)    Stiffness      Past Medical History  Diagnosis Date  . Nonischemic cardiomyopathy ([place]) 1998    EF of 25% in 1998, 15% in 2006; catheterization in 3/07-50% D1; luminal      irregularities in the other vessels; PA-65/25. 20% EF in 04/2007 with moderate MR and pulmonary htn; AICD/biventricular pacing--05/2005  . Hypertension   . Atrial fibrillation (HCC)     plus nonsustained ventricular tachycardia --> amiodarone treatment  . Nonsustained ventricular tachycardia ([place])   . OSA (obstructive sleep apnea)     no sleep machine  . Venous stasis   . Gout     Uric acid of 12.5 in 03/2009  .  Chronic anticoagulation   . History of lupus   . CHF (congestive heart failure) ([place])   . Chronic kidney disease, stage 4, severely decreased GFR (HCC)     Creatinine of 2.1 in 09/2008, 1.9 in 11/1998 and, 2.3 in 05/2010  . Chronic kidney disease   . Anemia     Past Surgical History  Procedure Laterality Date  . Appendectomy  1987  . Incision and drainage intra oral abscess  1991    submandibular abscess  . Cardiac defibrillator placement  05/2005    Medtronic Insync, Biventricular  . Colonoscopy with esophagogastroduodenoscopy (egd) N/A 03/20/2013    RMR: Gastric erosions and nodularity as described above s/p bx s/p hemostasis cliping/Rectal polyp-removed as described above. Polyp at ileocecal valve and cecum requiring saline/epinephrine injection assistance piecemeal polypectomy with subsequent APC ablation and clip. Stomach biopsy showed mild gastritis with H. pylori. 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 Rectum TA  . Colonoscopy N/A 12/09/2013    RMR: The site of larger tubulovillous adenomas about the ileocecal valve and cecum looked good with no residual polyp tissue at those locations. Multiple smaller polyps throughout the colon as outlined above treated/removed as described above. tubular adenomas. next TCS 11/2016    Prior to Admission medications   Medication Sig Start Date End Date Taking? Authorizing Provider  acetaminophen (TYLENOL) 500 MG tablet Take 500  mg by mouth every 6 (six) hours as needed (pain). For pain   Yes Historical Provider, MD  allopurinol (ZYLOPRIM) 100 MG tablet Take 100 mg by mouth daily.    Yes Historical Provider, MD  amiodarone (PACERONE) 200 MG tablet TAKE ONE TABLET BY MOUTH ONCE DAILY MONDAY THROUGH FRIDAY, AND 1/2 TABLET SATURDAY AND SUNDAY. 02/03/14  Yes Evans Lance, MD  buPROPion (WELLBUTRIN SR) 100 MG 12 hr tablet Take 1 tablet (100 mg total) by mouth daily. Patient taking differently: Take 300 mg by mouth daily.  08/30/12   Yes Evans Lance, MD  carvedilol (COREG) 25 MG tablet Take 25 mg by mouth 2 (two) times daily with a meal.   Yes Historical Provider, MD  cholecalciferol (VITAMIN D) 1000 UNITS tablet Take 1,000 Units by mouth daily.   Yes Historical Provider, MD  digoxin (LANOXIN) 0.125 MG tablet Take 1 tablet (125 mcg total) by mouth every other day. 12/12/13  Yes Evans Lance, MD  furosemide (LASIX) 40 MG tablet Take 40 mg by mouth 2 (two) times daily.   Yes Historical Provider, MD  IRON PO Take 150 mg by mouth daily.   Yes Historical Provider, MD  pantoprazole (PROTONIX) 40 MG tablet TAKE 1 TABLET BY MOUTH ONCE DAILY. 10/01/14  Yes Carlis Stable, NP  spironolactone (ALDACTONE) 25 MG tablet TAKE ONE TABLET BY MOUTH ONCE DAILY IN THE MORNING. Patient taking differently: TAKE ONE TABLET BY MOUTH ONCE DAILY IN THE EVENING 10/22/14  Yes Evans Lance, MD  warfarin (COUMADIN) 5 MG tablet Take 1 tablet daily except 1 1/2 tablets on Thursdays or as directed Patient taking differently: Take 1 tablet daily except 1 1/2 tablets on Tuesday, Thursdays or as directed 08/26/14  Yes Evans Lance, MD    Social History:  reports that she has been smoking Cigarettes.  She has a 15 pack-year smoking history. She has never used smokeless tobacco. She reports that she does not drink alcohol or use illicit drugs.  Family History  Problem Relation Age of Onset  . Heart attack Mother 30  . Kidney failure Father 48  . Heart attack Brother   . Colon cancer Neg Hx     Review of Systems:  Constitutional: Denies fever, chills, diaphoresis. HEENT: Denies photophobia, eye pain, redness, hearing loss, ear pain, congestion, sore throat, rhinorrhea, sneezing, mouth sores, trouble swallowing, neck pain, neck stiffness and tinnitus.   Respiratory: Denies chest tightness,  and wheezing.   Cardiovascular: Denies chest pain, palpitations  Gastrointestinal: Denies nausea, vomiting, abdominal pain, diarrhea, constipation, blood in stool and  abdominal distention.  Genitourinary: Denies dysuria, urgency, frequency, hematuria, flank pain and difficulty urinating.  Endocrine: Denies: hot or cold intolerance, sweats, changes in hair or nails, polyuria, polydipsia. Musculoskeletal: Denies myalgias, back pain, joint swelling, arthralgias and gait problem.  Skin: Denies pallor, rash and wound.  Neurological: Denies dizziness, seizures, syncope, weakness, light-headedness, numbness and headaches.  Hematological: Denies adenopathy. Easy bruising, personal or family bleeding history  Psychiatric/Behavioral: Denies suicidal ideation, mood changes, confusion, nervousness, sleep disturbance and agitation   Physical Exam: Blood pressure 121/46, pulse 71, temperature 98.6 F (37 C), temperature source Oral, resp. rate 18, height _0  (1.727 m), weight 98.431 kg (217 lb), SpO2 93 %. General: Alert, awake, oriented 3 HEENT: Normocephalic, atraumatic, pupils equal and reactive to light, extraocular movements intact, dry mucous membranes Neck: Supple, no JVD, no lymphadenopathy, no bruits, no goiter. Cardiovascular: Regular rate and rhythm, no murmurs, rubs or gallops auscultated  Lungs: Clear to auscultation bilaterally. Abdomen: Soft, nontender, nondistended, positive bowel sounds, no masses or organomegaly noted. Next and extremities: 1-2+ pitting edema bilaterally up to mid shin. Neurologic: Grossly intact and nonfocal.  Labs on Admission:  Results for orders placed or performed during the hospital encounter of 11/13/14 (from the past 48 hour(s))  CBC with Differential/Platelet     Status: Abnormal   Collection Time: 11/13/14  9:50 AM  Result Value Ref Range   WBC 8.1 4.0 - 10.5 K/uL   RBC 2.71 (L) 3.87 - 5.11 MIL/uL   Hemoglobin 5.4 (LL) 12.0 - 15.0 g/dL    Comment: RESULT REPEATED AND VERIFIED CRITICAL RESULT CALLED TO, READ BACK BY AND VERIFIED WITH: JUDY YOUNG RN ON [id number] AT 1025 BY RESSEGGER R    HCT 20.4 (L) 36.0 - 46.0 %    MCV 75.3 (L) 78.0 - 100.0 fL   MCH 19.9 (L) 26.0 - 34.0 pg   MCHC 26.5 (L) 30.0 - 36.0 g/dL   RDW 22.8 (H) 11.5 - 15.5 %   Platelets 436 (H) 150 - 400 K/uL   Neutrophils Relative % 71 %   Neutro Abs 5.7 1.7 - 7.7 K/uL   Lymphocytes Relative 17 %   Lymphs Abs 1.4 0.7 - 4.0 K/uL   Monocytes Relative 11 %   Monocytes Absolute 0.9 0.1 - 1.0 K/uL   Eosinophils Relative 2 %   Eosinophils Absolute 0.2 0.0 - 0.7 K/uL   Basophils Relative 0 %   Basophils Absolute 0.0 0.0 - 0.1 K/uL  Basic metabolic panel     Status: Abnormal   Collection Time: 11/13/14  9:50 AM  Result Value Ref Range   Sodium 134 (L) 135 - 145 mmol/L   Potassium 4.4 3.5 - 5.1 mmol/L   Chloride 99 (L) 101 - 111 mmol/L   CO2 28 22 - 32 mmol/L   Glucose, Bld 108 (H) 65 - 99 mg/dL   BUN 51 (H) 6 - 20 mg/dL   Creatinine, Ser 2.76 (H) 0.44 - 1.00 mg/dL   Calcium 8.6 (L) 8.9 - 10.3 mg/dL   GFR calc non Af Amer 16 (L) >60 mL/min   GFR calc Af Amer 19 (L) >60 mL/min    Comment: (NOTE) The eGFR has been calculated using the CKD EPI equation. This calculation has not been validated in all clinical situations. eGFR's persistently <60 mL/min signify possible Chronic Kidney Disease.    Anion gap 7 5 - 15  Protime-INR     Status: Abnormal   Collection Time: 11/13/14  9:50 AM  Result Value Ref Range   Prothrombin Time 32.0 (H) 11.6 - 15.2 seconds   INR 3.18 (H) 0.00 - 1.49  Type and screen     Status: None (Preliminary result)   Collection Time: 11/13/14  9:50 AM  Result Value Ref Range   ABO/RH(D) O POS    Antibody Screen NEG    Sample Expiration 11/16/2014    Unit Number [id number]    Blood Component Type RED CELLS,LR    Unit division 00    Status of Unit ALLOCATED    Transfusion Status OK TO TRANSFUSE    Crossmatch Result Compatible    Unit Number [id number]    Blood Component Type RED CELLS,LR    Unit division 00    Status of Unit ISSUED    Transfusion Status OK TO TRANSFUSE    Crossmatch Result Compatible    Digoxin level     Status: None   Collection  Time: 11/13/14  9:50 AM  Result Value Ref Range   Digoxin Level 1.3 0.8 - 2.0 ng/mL  ABO/Rh     Status: None   Collection Time: 11/13/14  9:50 AM  Result Value Ref Range   ABO/RH(D) O POS   POC occult blood, ED     Status: None   Collection Time: 11/13/14 10:04 AM  Result Value Ref Range   Fecal Occult Bld NEGATIVE NEGATIVE  Prepare RBC     Status: None   Collection Time: 11/13/14 11:00 AM  Result Value Ref Range   Order Confirmation ORDER PROCESSED BY BLOOD BANK   Reticulocytes     Status: Abnormal   Collection Time: 11/13/14 11:13 AM  Result Value Ref Range   Retic Ct Pct 1.8 0.4 - 3.1 %   RBC. 2.56 (L) 3.87 - 5.11 MIL/uL   Retic Count, Manual 46.1 19.0 - 186.0 K/uL    Radiological Exams on Admission: Dg Chest Portable 1 View  11/13/2014  CLINICAL DATA:  Short of breath today, hypertension, smoking history EXAM: PORTABLE CHEST 1 VIEW COMPARISON:  Chest x-ray of 06/26/2010 FINDINGS: The lungs are not well aerated and there is moderate cardiomegaly present. Mild pulmonary vascular congestion is a consideration. AICD leads remain. IMPRESSION: Poor inspiration. Stable moderate cardiomegaly. Question mild pulmonary vascular congestion. Electronically Signed   By: Ivar Drape M.D.   On: 11/13/2014 11:18    Assessment/Plan Principal Problem:   Anemia of chronic disease Active Problems:   Hypertension   Atrial fibrillation (HCC)   Chronic anticoagulation   Chronic kidney disease, stage 4, severely decreased GFR (HCC)   Anemia, chronic disease    History of chronic disease/iron deficiency, worsening -No signs of active GI bleed. -Anemia panel has been requested prior to transfusion. -2 units of PRBCs will be transfused, will continue iron supplementation. -We'll recheck hemoglobin posttransfusion to decide on whether more units of PRBCs are required or not. -Do not believe a GI consult for GI workup is required at this time, she  could certainly follow-up with GI as an outpatient as scheduled. -Continue daily PPI.  Generalized weakness -Suspect secondary to anemia.  Atrial fibrillation -Currently rate controlled, is maintained on chronic anticoagulation with Coumadin and I see no reason to discontinue this at this time given no sign of GI bleed.  Chronic kidney disease stage IV -At baseline.  Hypertension -Continue home medications  DVT prophylaxis -SCDs  CODE STATUS -Full code   Time Spent on Admission: 85 minutes  [facility] Pager: [phone] 11/13/2014, 3:05 PM

## 2014-11-13 NOTE — Progress Notes (Signed)
Nutrition Brief Note  Patient identified on the Malnutrition Screening Tool (MST) Report  Wt Readings from Last 15 Encounters:  11/13/14 217 lb (98.431 kg)  09/05/14 217 lb 9.6 oz (98.703 kg)  06/16/14 215 lb 9.6 oz (97.796 kg)  04/14/14 214 lb 6.4 oz (97.251 kg)  03/14/14 210 lb 9.6 oz (95.528 kg)  11/11/13 202 lb (91.627 kg)  09/13/13 198 lb (89.812 kg)  03/20/13 200 lb (90.719 kg)  02/21/13 200 lb 6.4 oz (90.901 kg)  08/30/12 197 lb 1.9 oz (89.413 kg)  10/04/11 189 lb (85.73 kg)  08/30/11 190 lb (86.183 kg)  12/28/10 194 lb 9.6 oz (88.27 kg)  10/25/10 205 lb 6.4 oz (93.169 kg)  09/28/10 200 lb (90.719 kg)    Body mass index is 33 kg/(m^2). Patient meets criteria for obesity class I based on current BMI. Her weight is stable between 210-217# this year. Results of NFPE normal.   Pt appetite very good. She is able to feed herself and follows a regular diet at home. Current diet order is heart healthy patient is consuming approximately >75% of meals at this time. Labs and medications reviewed.   No nutrition interventions warranted at this time. If nutrition issues arise, please consult RD.   Colman Cater MS,RD,CSG,LDN Office: [phone] Pager: [phone]

## 2014-11-13 NOTE — Telephone Encounter (Signed)
Opened in error

## 2014-11-13 NOTE — Progress Notes (Signed)
ANTICOAGULATION CONSULT NOTE - Initial Consult  Pharmacy Consult for Coumadin (chronic Rx PTA) Indication: atrial fibrillation  Allergies  Allergen Reactions  . Penicillins Other (See Comments)    Stiffness   Patient Measurements: Height: 5\' 8"  (172.7 cm) Weight: 217 lb (98.431 kg) IBW/kg (Calculated) : 63.9  Vital Signs: Temp: 97.6 F (36.4 C) (10/20 1459) Temp Source: Oral (10/20 1459) BP: 121/76 mmHg (10/20 1459) Pulse Rate: 69 (10/20 1459)  Labs:  Recent Labs  11/12/14 1035 11/13/14 0950  HGB  --  5.4*  HCT 20.5* 20.4*  PLT  --  436*  LABPROT  --  32.0*  INR  --  3.18*  CREATININE 2.59* 2.76*    Estimated Creatinine Clearance: 22.6 mL/min (by C-G formula based on Cr of 2.76).   Medical History: Past Medical History  Diagnosis Date  . Nonischemic cardiomyopathy ([place]) 1998    EF of 25% in 1998, 15% in 2006; catheterization in 3/07-50% D1; luminal      irregularities in the other vessels; PA-65/25. 20% EF in 04/2007 with moderate MR and pulmonary htn; AICD/biventricular pacing--05/2005  . Hypertension   . Atrial fibrillation (HCC)     plus nonsustained ventricular tachycardia --> amiodarone treatment  . Nonsustained ventricular tachycardia ([place])   . OSA (obstructive sleep apnea)     no sleep machine  . Venous stasis   . Gout     Uric acid of 12.5 in 03/2009  . Chronic anticoagulation   . History of lupus   . CHF (congestive heart failure) ([place])   . Chronic kidney disease, stage 4, severely decreased GFR (HCC)     Creatinine of 2.1 in 09/2008, 1.9 in 11/1998 and, 2.3 in 05/2010  . Chronic kidney disease   . Anemia     Medications:  Prescriptions prior to admission  Medication Sig Dispense Refill Last Dose  . acetaminophen (TYLENOL) 500 MG tablet Take 500 mg by mouth every 6 (six) hours as needed (pain). For pain   11/13/2014 at Unknown time  . allopurinol (ZYLOPRIM) 100 MG tablet Take 100 mg by mouth daily.    11/12/2014 at Unknown time  . amiodarone  (PACERONE) 200 MG tablet TAKE ONE TABLET BY MOUTH ONCE DAILY MONDAY THROUGH FRIDAY, AND 1/2 TABLET SATURDAY AND SUNDAY. 90 tablet 3 11/12/2014 at Unknown time  . buPROPion (WELLBUTRIN SR) 100 MG 12 hr tablet Take 1 tablet (100 mg total) by mouth daily. (Patient taking differently: Take 300 mg by mouth daily. ) 90 tablet 0 11/12/2014 at Unknown time  . carvedilol (COREG) 25 MG tablet Take 25 mg by mouth 2 (two) times daily with a meal.   11/13/2014 at 0800  . cholecalciferol (VITAMIN D) 1000 UNITS tablet Take 1,000 Units by mouth daily.   11/13/2014 at Unknown time  . digoxin (LANOXIN) 0.125 MG tablet Take 1 tablet (125 mcg total) by mouth every other day. 30 tablet 6 11/12/2014 at Unknown time  . furosemide (LASIX) 40 MG tablet Take 40 mg by mouth 2 (two) times daily.   11/12/2014 at Unknown time  . IRON PO Take 150 mg by mouth daily.   11/13/2014 at Unknown time  . pantoprazole (PROTONIX) 40 MG tablet TAKE 1 TABLET BY MOUTH ONCE DAILY. 30 tablet 5 11/13/2014 at Unknown time  . spironolactone (ALDACTONE) 25 MG tablet TAKE ONE TABLET BY MOUTH ONCE DAILY IN THE MORNING. (Patient taking differently: TAKE ONE TABLET BY MOUTH ONCE DAILY IN THE EVENING) 30 tablet 1 11/12/2014 at Unknown time  . warfarin (  COUMADIN) 5 MG tablet Take 1 tablet daily except 1 1/2 tablets on Thursdays or as directed (Patient taking differently: Take 1 tablet daily except 1 1/2 tablets on Tuesday, Thursdays or as directed) 45 tablet 3 11/12/2014 at 0800pm   Assessment: 74 y.o. female referred to the ED by her GI with h/o NICM, HTN, Afib, venous stasis, chronic anticoagulation, lupus, CHF, CKD stage IV, anemia who presents to the Emergency Department due to a HGB of 5. Pt states associated nausea, exertional weakness and SOB. Pt notes that her HGB has recently been maintained at 69. Pt is not O2 dependent at home. PCP is Dr. Nevada Crane. She denies fevers, vomiting, bloody stools, melena, hematuria, vaginal bleeding, CP, abdominal pain,  syncope. INR SUPRAtherapeutic.    Goal of Therapy:  INR 2-3 Monitor platelets by anticoagulation protocol: Yes   Plan:  HOLD coumadin today F/U INR daily CBC tomorrow  Hart Robinsons A 11/13/2014,3:12 PM

## 2014-11-13 NOTE — Telephone Encounter (Signed)
Quick Note:  Hgb less than 5.5. Normal two months ago. Called in 11/11/14 with complaints of feeling weak.  Patient needs to go to ER immediately for evaluation. Please ask her if she has seen any black or bloody stools.  Please notify triage nurse at ER of findings and why for ER evaluation. ______

## 2014-11-13 NOTE — ED Notes (Signed)
CRITICAL VALUE ALERT  Critical value received: hgb 5.4  Date of notification:  11/13/14  Time of notification:  7829  Critical value read back:yes  Nurse who received alert:  Ilda Mori  MD notified (1st page):  wickline  Time of first page:  1035  MD notified (2nd page):  Time of second page:  Responding MD:  wickline   Time MD responded: 5621

## 2014-11-14 LAB — HEMOGLOBIN AND HEMATOCRIT, BLOOD
HCT: 30.1 % — ABNORMAL LOW (ref 36.0–46.0)
HEMOGLOBIN: 8.7 g/dL — AB (ref 12.0–15.0)

## 2014-11-14 LAB — BASIC METABOLIC PANEL
ANION GAP: 6 (ref 5–15)
BUN: 48 mg/dL — ABNORMAL HIGH (ref 6–20)
CALCIUM: 8.4 mg/dL — AB (ref 8.9–10.3)
CO2: 30 mmol/L (ref 22–32)
CREATININE: 2.57 mg/dL — AB (ref 0.44–1.00)
Chloride: 102 mmol/L (ref 101–111)
GFR, EST AFRICAN AMERICAN: 20 mL/min — AB (ref >60)
GFR, EST NON AFRICAN AMERICAN: 18 mL/min — AB (ref >60)
Glucose, Bld: 87 mg/dL (ref 65–99)
Potassium: 4.9 mmol/L (ref 3.5–5.1)
SODIUM: 138 mmol/L (ref 135–145)

## 2014-11-14 LAB — CBC
HCT: 23.9 % — ABNORMAL LOW (ref 36.0–46.0)
HEMOGLOBIN: 6.9 g/dL — AB (ref 12.0–15.0)
MCH: 22.9 pg — AB (ref 26.0–34.0)
MCHC: 28.9 g/dL — ABNORMAL LOW (ref 30.0–36.0)
MCV: 79.4 fL (ref 78.0–100.0)
PLATELETS: 384 10*3/uL (ref 150–400)
RBC: 3.01 MIL/uL — AB (ref 3.87–5.11)
RDW: 23 % — ABNORMAL HIGH (ref 11.5–15.5)
WBC: 8.7 10*3/uL (ref 4.0–10.5)

## 2014-11-14 LAB — PROTIME-INR
INR: 3.2 — AB (ref 0.00–1.49)
PROTHROMBIN TIME: 32.1 s — AB (ref 11.6–15.2)

## 2014-11-14 LAB — PREPARE RBC (CROSSMATCH)

## 2014-11-14 MED ORDER — SODIUM CHLORIDE 0.9 % IV SOLN
Freq: Once | INTRAVENOUS | Status: AC
  Administered 2014-11-14: 08:00:00 via INTRAVENOUS

## 2014-11-14 MED ORDER — INFLUENZA VAC SPLIT QUAD 0.5 ML IM SUSY
0.5000 mL | PREFILLED_SYRINGE | Freq: Once | INTRAMUSCULAR | Status: AC
Start: 2014-11-14 — End: 2014-11-14
  Administered 2014-11-14: 0.5 mL via INTRAMUSCULAR
  Filled 2014-11-14: qty 0.5

## 2014-11-14 MED ORDER — NICOTINE 21 MG/24HR TD PT24
21.0000 mg | MEDICATED_PATCH | Freq: Every day | TRANSDERMAL | Status: DC
  Administered 2014-11-14: 21 mg via TRANSDERMAL
  Filled 2014-11-14: qty 1

## 2014-11-14 MED ORDER — FERROUS SULFATE 325 (65 FE) MG PO TABS: 325.0000 mg | ORAL_TABLET | Freq: Two times a day (BID) | ORAL | Status: DC

## 2014-11-14 MED ORDER — PNEUMOCOCCAL VAC POLYVALENT 25 MCG/0.5ML IJ INJ
0.5000 mL | INJECTION | Freq: Once | INTRAMUSCULAR | Status: AC
  Administered 2014-11-14: 0.5 mL via INTRAMUSCULAR
  Filled 2014-11-14: qty 0.5

## 2014-11-14 NOTE — Progress Notes (Signed)
CRITICAL VALUE ALERT  Critical value received:  Hemoglobin 6.9  Date of notification:  11/14/2014  Time of notification:  07:58  Critical value read back:Yes.    Nurse who received alert:  Radene Gunning  MD notified (1st page):  Dr Jerilee Hoh  Time of first page:  07:58  MD notified (2nd page):  Time of second page:  Responding MD:  Dr Jerilee Hoh  Time MD responded:  07:58

## 2014-11-14 NOTE — Care Management Note (Signed)
Case Management Note  Patient Details  Name: [name] [name] MRN: [id number] Date of Birth: 1940/06/10  Subjective/Objective:                  Pt is from home, lives alone and is ind at baseline. Pt has no HH services, DME's or med needs prior to admission.   Action/Plan: Pt plans to return home with self care at DC. No CM needs noted at this time.   Expected Discharge Date:    11/14/2014              Expected Discharge Plan:  Home/Self Care  In-House Referral:  NA  Discharge planning Services  CM Consult  Post Acute Care Choice:  NA Choice offered to:  NA  DME Arranged:    DME Agency:     HH Arranged:    HH Agency:     Status of Service:  Completed, signed off  Medicare Important Message Given:    Date Medicare IM Given:    Medicare IM give by:    Date Additional Medicare IM Given:    Additional Medicare Important Message give by:     If discussed at [place] of Stay Meetings, dates discussed:    Additional Comments:  Sherald Barge, RN 11/14/2014, 12:36 PM

## 2014-11-14 NOTE — Progress Notes (Signed)
ANTICOAGULATION CONSULT NOTE - follow up  Pharmacy Consult for Coumadin (chronic Rx PTA) Indication: atrial fibrillation  Allergies  Allergen Reactions  . Penicillins Other (See Comments)    Stiffness   Patient Measurements: Height: 5\' 8"  (172.7 cm) Weight: 217 lb (98.431 kg) IBW/kg (Calculated) : 63.9  Vital Signs: Temp: 98.4 F (36.9 C) (10/21 0922) Temp Source: Oral (10/21 0922) BP: 110/56 mmHg (10/21 0922) Pulse Rate: 77 (10/21 0922)  Labs:  Recent Labs  11/12/14 1035 11/13/14 0950 11/14/14 0548  HGB  --  5.4* 6.9*  HCT 20.5* 20.4* 23.9*  PLT  --  436* 384  LABPROT  --  32.0* 32.1*  INR  --  3.18* 3.20*  CREATININE 2.59* 2.76* 2.57*   Estimated Creatinine Clearance: 24.3 mL/min (by C-G formula based on Cr of 2.57).  Medical History: Past Medical History  Diagnosis Date  . Nonischemic cardiomyopathy ([place]) 1998    EF of 25% in 1998, 15% in 2006; catheterization in 3/07-50% D1; luminal      irregularities in the other vessels; PA-65/25. 20% EF in 04/2007 with moderate MR and pulmonary htn; AICD/biventricular pacing--05/2005  . Hypertension   . Atrial fibrillation (HCC)     plus nonsustained ventricular tachycardia --> amiodarone treatment  . Nonsustained ventricular tachycardia ([place])   . OSA (obstructive sleep apnea)     no sleep machine  . Venous stasis   . Gout     Uric acid of 12.5 in 03/2009  . Chronic anticoagulation   . History of lupus   . CHF (congestive heart failure) ([place])   . Chronic kidney disease, stage 4, severely decreased GFR (HCC)     Creatinine of 2.1 in 09/2008, 1.9 in 11/1998 and, 2.3 in 05/2010  . Chronic kidney disease   . Anemia    Medications:  Prescriptions prior to admission  Medication Sig Dispense Refill Last Dose  . acetaminophen (TYLENOL) 500 MG tablet Take 500 mg by mouth every 6 (six) hours as needed (pain). For pain   11/13/2014 at Unknown time  . allopurinol (ZYLOPRIM) 100 MG tablet Take 100 mg by mouth daily.    11/12/2014  at Unknown time  . amiodarone (PACERONE) 200 MG tablet TAKE ONE TABLET BY MOUTH ONCE DAILY MONDAY THROUGH FRIDAY, AND 1/2 TABLET SATURDAY AND SUNDAY. 90 tablet 3 11/12/2014 at Unknown time  . buPROPion (WELLBUTRIN SR) 100 MG 12 hr tablet Take 1 tablet (100 mg total) by mouth daily. (Patient taking differently: Take 300 mg by mouth daily. ) 90 tablet 0 11/12/2014 at Unknown time  . carvedilol (COREG) 25 MG tablet Take 25 mg by mouth 2 (two) times daily with a meal.   11/13/2014 at 0800  . cholecalciferol (VITAMIN D) 1000 UNITS tablet Take 1,000 Units by mouth daily.   11/13/2014 at Unknown time  . digoxin (LANOXIN) 0.125 MG tablet Take 1 tablet (125 mcg total) by mouth every other day. 30 tablet 6 11/12/2014 at Unknown time  . furosemide (LASIX) 40 MG tablet Take 40 mg by mouth 2 (two) times daily.   11/12/2014 at Unknown time  . IRON PO Take 150 mg by mouth daily.   11/13/2014 at Unknown time  . pantoprazole (PROTONIX) 40 MG tablet TAKE 1 TABLET BY MOUTH ONCE DAILY. 30 tablet 5 11/13/2014 at Unknown time  . spironolactone (ALDACTONE) 25 MG tablet TAKE ONE TABLET BY MOUTH ONCE DAILY IN THE MORNING. (Patient taking differently: TAKE ONE TABLET BY MOUTH ONCE DAILY IN THE EVENING) 30 tablet 1 11/12/2014 at  Unknown time  . warfarin (COUMADIN) 5 MG tablet Take 1 tablet daily except 1 1/2 tablets on Thursdays or as directed (Patient taking differently: Take 1 tablet daily except 1 1/2 tablets on Tuesday, Thursdays or as directed) 45 tablet 3 11/12/2014 at 0800pm   Assessment: 74 y.o. female referred to the ED by her GI with h/o NICM, HTN, Afib, venous stasis, chronic anticoagulation, lupus, CHF, CKD stage IV, anemia who presents to the Emergency Department due to a HGB of 5. Pt states associated nausea, exertional weakness and SOB. Pt notes that her HGB has recently been maintained at 83. Pt is not O2 dependent at home. PCP is Dr. Nevada Crane. She denies fevers, vomiting, bloody stools, melena, hematuria, vaginal  bleeding, CP, abdominal pain, syncope. INR SUPRAtherapeutic.    Goal of Therapy:  INR 2-3 Monitor platelets by anticoagulation protocol: Yes   Plan:  HOLD coumadin today F/U INR daily CBC tomorrow  Hart Robinsons A 11/14/2014,10:05 AM

## 2014-11-15 DIAGNOSIS — D638 Anemia in other chronic diseases classified elsewhere: Secondary | ICD-10-CM | POA: Diagnosis not present

## 2014-11-15 DIAGNOSIS — I4891 Unspecified atrial fibrillation: Secondary | ICD-10-CM

## 2014-11-15 LAB — TYPE AND SCREEN
ABO/RH(D): O POS
Antibody Screen: NEGATIVE
UNIT DIVISION: 0
Unit division: 0
Unit division: 0
Unit division: 0

## 2014-11-15 NOTE — Discharge Summary (Signed)
Physician Discharge Summary  [name] [name] ZWC:[id number] DOB: [birth year] DOA: 11/13/2014  PCP: Wende Neighbors, MD  Admit date: 11/13/2014 Discharge date: 11/15/2014  Time spent: 45 minutes  Recommendations for Outpatient Follow-up:  -Will be discharged home today. -Advised to follow up with her PCP in 2 weeks.   Discharge Diagnoses:  Principal Problem:   Anemia of chronic disease Active Problems:   Hypertension   Atrial fibrillation (HCC)   Chronic anticoagulation   Chronic kidney disease, stage 4, severely decreased GFR (HCC)   Anemia, chronic disease   Discharge Condition: Stable and improved  Filed Weights   11/13/14 0944  Weight: 98.431 kg (217 lb)    History of present illness:  Patient is a 74 year old lady who presents to the hospital today at the urging of her GI physician after routine blood work showed a hemoglobin of 5. She has followed routinely with Dr. Gala Romney for what appears to be a combination of anemia of chronic disease/iron deficiency anemia. She has had colonoscopies have not evidence any source of bleeding in the past. She does have advanced chronic kidney disease and this has been believed to be the source of her anemia in the past. Review of GI notes state that in August her hemoglobin was 14. Her hemoglobin today numerous department is 5.4. Upon questioning she states that she has been feeling very fatigued, has developed foot edema, has been anorexic, she has also been a little short of breath and dizzy when standing. In the ED an FOBT is negative. Past medical history significant for atrial fibrillation status post permanent pacemaker and maintained on chronic anticoagulation with Coumadin, GERD, stage IV chronic kidney disease, hypertension. We have been asked to admit her for further evaluation and management.  Hospital Course:   History of chronic disease/iron deficiency, worsening -No signs of active GI bleed/FOBT was negative. She denies melena or  hematochezia. -Anemia panel has been requested prior to transfusion, which shows significant iron deficiency with a ferritin <10.  -Received a total of 4 units of PRBCs with a Hb increasing from 5.4 on admission to 8.7 on DC. -Do not believe a GI consult for GI workup is required at this time, she could certainly follow-up with GI as an outpatient as scheduled. -Continue daily PPI.  Generalized weakness -Suspect secondary to anemia. -Improved after transfusion.  Atrial fibrillation -Currently rate controlled, is maintained on chronic anticoagulation with Coumadin and I see no reason to discontinue this at this time given no sign of GI bleed.  Chronic kidney disease stage IV -At baseline.  Hypertension -Continue home medications  Procedures:  None   Consultations:  None  Discharge Instructions  Discharge Instructions    Diet - low sodium heart healthy    Complete by:  As directed      Increase activity slowly    Complete by:  As directed             Medication List    TAKE these medications        acetaminophen 500 MG tablet  Commonly known as:  TYLENOL  Take 500 mg by mouth every 6 (six) hours as needed (pain). For pain     allopurinol 100 MG tablet  Commonly known as:  ZYLOPRIM  Take 100 mg by mouth daily.     amiodarone 200 MG tablet  Commonly known as:  PACERONE  TAKE ONE TABLET BY MOUTH ONCE DAILY MONDAY THROUGH FRIDAY, AND 1/2 TABLET SATURDAY AND SUNDAY.  buPROPion 100 MG 12 hr tablet  Commonly known as:  WELLBUTRIN SR  Take 1 tablet (100 mg total) by mouth daily.     carvedilol 25 MG tablet  Commonly known as:  COREG  Take 25 mg by mouth 2 (two) times daily with a meal.     cholecalciferol 1000 UNITS tablet  Commonly known as:  VITAMIN D  Take 1,000 Units by mouth daily.     digoxin 0.125 MG tablet  Commonly known as:  LANOXIN  Take 1 tablet (125 mcg total) by mouth every other day.     ferrous sulfate 325 (65 FE) MG tablet  Take 1  tablet (325 mg total) by mouth 2 (two) times daily with a meal.     furosemide 40 MG tablet  Commonly known as:  LASIX  Take 40 mg by mouth 2 (two) times daily.     pantoprazole 40 MG tablet  Commonly known as:  PROTONIX  TAKE 1 TABLET BY MOUTH ONCE DAILY.     spironolactone 25 MG tablet  Commonly known as:  ALDACTONE  TAKE ONE TABLET BY MOUTH ONCE DAILY IN THE MORNING.     warfarin 5 MG tablet  Commonly known as:  COUMADIN  Take 1 tablet daily except 1 1/2 tablets on Thursdays or as directed       Allergies  Allergen Reactions  . Penicillins Other (See Comments)    Stiffness       Follow-up Information    Follow up with Wende Neighbors, MD. Schedule an appointment as soon as possible for a visit in 2 weeks.   Specialty:  Internal Medicine   Contact information:   [place] [id number] [phone]        The results of significant diagnostics from this hospitalization (including imaging, microbiology, ancillary and laboratory) are listed below for reference.    Significant Diagnostic Studies: Dg Chest Portable 1 View  11/13/2014  CLINICAL DATA:  Short of breath today, hypertension, smoking history EXAM: PORTABLE CHEST 1 VIEW COMPARISON:  Chest x-ray of 06/26/2010 FINDINGS: The lungs are not well aerated and there is moderate cardiomegaly present. Mild pulmonary vascular congestion is a consideration. AICD leads remain. IMPRESSION: Poor inspiration. Stable moderate cardiomegaly. Question mild pulmonary vascular congestion. Electronically Signed   By: Ivar Drape M.D.   On: 11/13/2014 11:18    Microbiology: No results found for this or any previous visit (from the past 240 hour(s)).   Labs: Basic Metabolic Panel:  Recent Labs Lab 11/12/14 1035 11/13/14 0950 11/14/14 0548  NA 135* 134* 138  K 4.7 4.4 4.9  CL 96* 99* 102  CO2 25 28 30   GLUCOSE 102* 108* 87  BUN 48* 51* 48*  CREATININE 2.59* 2.76* 2.57*  CALCIUM 9.2 8.6* 8.4*   Liver Function  Tests: No results for input(s): AST, ALT, ALKPHOS, BILITOT, PROT, ALBUMIN in the last 168 hours. No results for input(s): LIPASE, AMYLASE in the last 168 hours. No results for input(s): AMMONIA in the last 168 hours. CBC:  Recent Labs Lab 11/12/14 1035 11/13/14 0950 11/14/14 0548 11/14/14 1725  WBC 8.3 8.1 8.7  --   NEUTROABS  --  5.7  --   --   HGB  --  5.4* 6.9* 8.7*  HCT 20.5* 20.4* 23.9* 30.1*  MCV  --  75.3* 79.4  --   PLT  --  436* 384  --    Cardiac Enzymes: No results for input(s): CKTOTAL, CKMB, CKMBINDEX, TROPONINI in  the last 168 hours. BNP: BNP (last 3 results) No results for input(s): BNP in the last 8760 hours.  ProBNP (last 3 results) No results for input(s): PROBNP in the last 8760 hours.  CBG: No results for input(s): GLUCAP in the last 168 hours.     SignedLelon Frohlich  [facility] Pager: [phone] 11/15/2014, 3:57 PM

## 2014-11-17 ENCOUNTER — Other Ambulatory Visit: Payer: Self-pay | Admitting: Internal Medicine

## 2014-11-19 ENCOUNTER — Encounter: Payer: Self-pay | Admitting: Gastroenterology

## 2014-11-19 ENCOUNTER — Encounter: Payer: Self-pay | Admitting: Cardiology

## 2014-11-19 ENCOUNTER — Ambulatory Visit (INDEPENDENT_AMBULATORY_CARE_PROVIDER_SITE_OTHER): Payer: Medicare Other | Admitting: Cardiology

## 2014-11-19 VITALS — BP 111/71 | HR 76 | Ht 68.0 in | Wt 213.0 lb

## 2014-11-19 DIAGNOSIS — Z7901 Long term (current) use of anticoagulants: Secondary | ICD-10-CM

## 2014-11-19 DIAGNOSIS — I4729 Other ventricular tachycardia: Secondary | ICD-10-CM

## 2014-11-19 DIAGNOSIS — I5022 Chronic systolic (congestive) heart failure: Secondary | ICD-10-CM

## 2014-11-19 DIAGNOSIS — I4891 Unspecified atrial fibrillation: Secondary | ICD-10-CM

## 2014-11-19 DIAGNOSIS — I340 Nonrheumatic mitral (valve) insufficiency: Secondary | ICD-10-CM

## 2014-11-19 DIAGNOSIS — I472 Ventricular tachycardia: Secondary | ICD-10-CM | POA: Diagnosis not present

## 2014-11-19 MED ORDER — FUROSEMIDE 40 MG PO TABS: ORAL_TABLET | ORAL | Status: DC

## 2014-11-19 NOTE — Patient Instructions (Signed)
Your physician recommends that you schedule a follow-up appointment in: [place] [address] [place]  Your physician has recommended you make the following change in your medication:   INCREASE LASIX 60 MG IN THE MORNING AND 40 MG IN THE EVENING   STOP DIGOXIN   STOP ALDACTONE   Your physician has requested that you have an echocardiogram. Echocardiography is a painless test that uses sound waves to create images of your heart. It provides your doctor with information about the size and shape of your heart and how well your heart's chambers and valves are working. This procedure takes approximately one hour. There are no restrictions for this procedure.  Your physician recommends that you return for lab work in: 2 WEEKS BMP/CBC/MG  Thank you for choosing [facility]!!

## 2014-11-19 NOTE — Telephone Encounter (Signed)
APPT MADE AND LETTER SENT  °

## 2014-11-19 NOTE — Telephone Encounter (Signed)
Quick Note:  Patient needs OV in 2 weeks for anemia. May use urgent if needed.  Please have her complete ifobt in the interim. ______

## 2014-11-19 NOTE — Progress Notes (Signed)
Patient ID: [place] B Steeber, female   DOB: 01-Aug-1940, 74 y.o.   MRN: [id number]     Clinical Summary Ms. Rozzell is a 74 y.o.female last seen by Dr Lovena Le, this is our first visit together. She is seen for the following medical problems.  1. NICM - echo 04/2007 LVEF 20% - has BiV AICD followed by Dr Lovena Le.  - notes some LE edema at times, worst since recent admission with blood transfusion. No SOB or DOE. No orthopnea   2. PAF - denies any palpitations - compliant with coumadin, no recent bleeding.   3. VT - has been on low dose amio - had BiV AICD in place.  - no recent palpitations  4. Moderate MR - from echo 2009 - occas swelling as described above  5. CKD IV - followed by Dr Hinda Lenis  6. Fe deficient anemia - noted during recent admit 10/2014, received 4 units pRBCs with Hgb as low as 5.4, discharge Hgb 8.7. - she is followed by GI as outpatient.  Past Medical History  Diagnosis Date  . Nonischemic cardiomyopathy ([place]) 1998    EF of 25% in 1998, 15% in 2006; catheterization in 3/07-50% D1; luminal      irregularities in the other vessels; PA-65/25. 20% EF in 04/2007 with moderate MR and pulmonary htn; AICD/biventricular pacing--05/2005  . Hypertension   . Atrial fibrillation (HCC)     plus nonsustained ventricular tachycardia --> amiodarone treatment  . Nonsustained ventricular tachycardia ([place])   . OSA (obstructive sleep apnea)     no sleep machine  . Venous stasis   . Gout     Uric acid of 12.5 in 03/2009  . Chronic anticoagulation   . History of lupus   . CHF (congestive heart failure) ([place])   . Chronic kidney disease, stage 4, severely decreased GFR (HCC)     Creatinine of 2.1 in 09/2008, 1.9 in 11/1998 and, 2.3 in 05/2010  . Chronic kidney disease   . Anemia      Allergies  Allergen Reactions  . Penicillins Other (See Comments)    Stiffness     Current Outpatient Prescriptions  Medication Sig Dispense Refill  . acetaminophen (TYLENOL) 500 MG tablet  Take 500 mg by mouth every 6 (six) hours as needed (pain). For pain    . allopurinol (ZYLOPRIM) 100 MG tablet Take 100 mg by mouth daily.     Marland Kitchen amiodarone (PACERONE) 200 MG tablet TAKE ONE TABLET BY MOUTH ONCE DAILY MONDAY THROUGH FRIDAY, AND 1/2 TABLET SATURDAY AND SUNDAY. 90 tablet 3  . buPROPion (WELLBUTRIN SR) 100 MG 12 hr tablet Take 1 tablet (100 mg total) by mouth daily. (Patient taking differently: Take 300 mg by mouth daily. ) 90 tablet 0  . carvedilol (COREG) 25 MG tablet Take 25 mg by mouth 2 (two) times daily with a meal.    . cholecalciferol (VITAMIN D) 1000 UNITS tablet Take 1,000 Units by mouth daily.    . digoxin (LANOXIN) 0.125 MG tablet Take 1 tablet (125 mcg total) by mouth every other day. 30 tablet 6  . ferrous sulfate 325 (65 FE) MG tablet Take 1 tablet (325 mg total) by mouth 2 (two) times daily with a meal.  3  . furosemide (LASIX) 40 MG tablet Take 40 mg by mouth 2 (two) times daily.    . pantoprazole (PROTONIX) 40 MG tablet TAKE 1 TABLET BY MOUTH ONCE DAILY. 30 tablet 5  . spironolactone (ALDACTONE) 25 MG tablet TAKE ONE TABLET BY  MOUTH ONCE DAILY IN THE MORNING. (Patient taking differently: TAKE ONE TABLET BY MOUTH ONCE DAILY IN THE EVENING) 30 tablet 1  . warfarin (COUMADIN) 5 MG tablet Take 1 tablet daily except 1 1/2 tablets on Thursdays or as directed (Patient taking differently: Take 1 tablet daily except 1 1/2 tablets on Tuesday, Thursdays or as directed) 45 tablet 3   No current facility-administered medications for this visit.     Past Surgical History  Procedure Laterality Date  . Appendectomy  1987  . Incision and drainage intra oral abscess  1991    submandibular abscess  . Cardiac defibrillator placement  05/2005    Medtronic Insync, Biventricular  . Colonoscopy with esophagogastroduodenoscopy (egd) N/A 03/20/2013    RMR: Gastric erosions and nodularity as described above s/p bx s/p hemostasis cliping/Rectal polyp-removed as described above. Polyp at  ileocecal valve and cecum requiring saline/epinephrine injection assistance piecemeal polypectomy with subsequent APC ablation and clip. Stomach biopsy showed mild gastritis with H. pylori. 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 Rectum TA  . Colonoscopy N/A 12/09/2013    RMR: The site of larger tubulovillous adenomas about the ileocecal valve and cecum looked good with no residual polyp tissue at those locations. Multiple smaller polyps throughout the colon as outlined above treated/removed as described above. tubular adenomas. next TCS 11/2016     Allergies  Allergen Reactions  . Penicillins Other (See Comments)    Stiffness      Family History  Problem Relation Age of Onset  . Heart attack Mother 66  . Kidney failure Father 107  . Heart attack Brother   . Colon cancer Neg Hx      Social History Ms. Olejniczak reports that she has been smoking Cigarettes.  She has a 15 pack-year smoking history. She has never used smokeless tobacco. Ms. Sebree reports that she does not drink alcohol.   Review of Systems CONSTITUTIONAL: No weight loss, fever, chills, weakness or fatigue.  HEENT: Eyes: No visual loss, blurred vision, double vision or yellow sclerae.No hearing loss, sneezing, congestion, runny nose or sore throat.  SKIN: No rash or itching.  CARDIOVASCULAR: per HPI RESPIRATORY: No shortness of breath, cough or sputum.  GASTROINTESTINAL: No anorexia, nausea, vomiting or diarrhea. No abdominal pain or blood.  GENITOURINARY: No burning on urination, no polyuria NEUROLOGICAL: No headache, dizziness, syncope, paralysis, ataxia, numbness or tingling in the extremities. No change in bowel or bladder control.  MUSCULOSKELETAL: No muscle, back pain, joint pain or stiffness.  LYMPHATICS: No enlarged nodes. No history of splenectomy.  PSYCHIATRIC: No history of depression or anxiety.  ENDOCRINOLOGIC: No reports of sweating, cold or heat intolerance. No polyuria or  polydipsia.  Marland Kitchen   Physical Examination Filed Vitals:   11/19/14 0953  BP: 111/71  Pulse: 76   Filed Vitals:   11/19/14 0953  Height: 5\' 8"  (1.727 m)  Weight: 213 lb (96.616 kg)    Gen: resting comfortably, no acute distress HEENT: no scleral icterus, pupils equal round and reactive, no palptable cervical adenopathy,  CV: RRR, 2/6 systolic murmur at apex, no JVD Resp: Clear to auscultation bilaterally GI: abdomen is soft, non-tender, non-distended, normal bowel sounds, no hepatosplenomegaly MSK: extremities are warm, no edema.  Skin: warm, no rash Neuro:  no focal deficits Psych: appropriate affect     Assessment and Plan  1. NICM - recent LE edema since being admitted and being transfused 4 units pRBCs - will increase lasix to 60mg  in AM and 40mg  in PM.  Repeat BMET and Mg in 2 weeks - repeat echo, last study in 2009 - with GFR of 20 will stop digoxin and aldactone due to increased risk for side effects   2. PAF - no current symptoms. Continue current meds  3. VT - no current symptoms, continue amio  4. Moderate MR - repeat echo  5. Fe deficient anemia - repeat cbc in 2 weeks.    F/u 3 weeks.   Arnoldo Lenis, M.D.

## 2014-11-25 ENCOUNTER — Encounter: Payer: Self-pay | Admitting: *Deleted

## 2014-11-28 ENCOUNTER — Ambulatory Visit (INDEPENDENT_AMBULATORY_CARE_PROVIDER_SITE_OTHER): Payer: Medicare Other

## 2014-11-28 DIAGNOSIS — D649 Anemia, unspecified: Secondary | ICD-10-CM | POA: Diagnosis not present

## 2014-12-01 LAB — IFOBT (OCCULT BLOOD): IMMUNOLOGICAL FECAL OCCULT BLOOD TEST: NEGATIVE

## 2014-12-02 DIAGNOSIS — D509 Iron deficiency anemia, unspecified: Secondary | ICD-10-CM | POA: Diagnosis not present

## 2014-12-02 DIAGNOSIS — D649 Anemia, unspecified: Secondary | ICD-10-CM | POA: Diagnosis not present

## 2014-12-03 ENCOUNTER — Ambulatory Visit: Payer: Medicare Other | Admitting: Gastroenterology

## 2014-12-03 ENCOUNTER — Encounter: Payer: Self-pay | Admitting: Gastroenterology

## 2014-12-04 ENCOUNTER — Encounter: Payer: Self-pay | Admitting: *Deleted

## 2014-12-04 NOTE — Progress Notes (Signed)
Quick Note:  Heme negative X 2 in last three weeks. IDA based on admission labs. Recent transfusions for hgb 5.5, had been normal in 08/2014. Patient no showed this week for appt with me.   She needs ov in 2-3 weeks with rmr for ida. Please find out if she has upcoming blood work with her cardiologist cause she needs her hgb rechecked. ______

## 2014-12-08 ENCOUNTER — Encounter: Payer: Self-pay | Admitting: Internal Medicine

## 2014-12-08 NOTE — Progress Notes (Signed)
APPT MADE AND LETTER SENT  °

## 2014-12-10 ENCOUNTER — Other Ambulatory Visit: Payer: Medicare Other

## 2014-12-11 ENCOUNTER — Ambulatory Visit: Payer: Medicare Other | Admitting: Cardiology

## 2014-12-16 ENCOUNTER — Ambulatory Visit (INDEPENDENT_AMBULATORY_CARE_PROVIDER_SITE_OTHER): Payer: Medicare Other | Admitting: *Deleted

## 2014-12-16 DIAGNOSIS — I4891 Unspecified atrial fibrillation: Secondary | ICD-10-CM | POA: Diagnosis not present

## 2014-12-16 DIAGNOSIS — Z5181 Encounter for therapeutic drug level monitoring: Secondary | ICD-10-CM | POA: Diagnosis not present

## 2014-12-16 DIAGNOSIS — Z7901 Long term (current) use of anticoagulants: Secondary | ICD-10-CM

## 2014-12-16 LAB — POCT INR: INR: 5.6

## 2014-12-22 ENCOUNTER — Encounter: Payer: Self-pay | Admitting: *Deleted

## 2014-12-24 ENCOUNTER — Other Ambulatory Visit: Payer: Self-pay

## 2014-12-24 ENCOUNTER — Ambulatory Visit (INDEPENDENT_AMBULATORY_CARE_PROVIDER_SITE_OTHER): Payer: Medicare Other

## 2014-12-24 DIAGNOSIS — I5022 Chronic systolic (congestive) heart failure: Secondary | ICD-10-CM

## 2014-12-25 ENCOUNTER — Ambulatory Visit (INDEPENDENT_AMBULATORY_CARE_PROVIDER_SITE_OTHER): Payer: Medicare Other | Admitting: *Deleted

## 2014-12-25 DIAGNOSIS — Z7901 Long term (current) use of anticoagulants: Secondary | ICD-10-CM

## 2014-12-25 DIAGNOSIS — I4891 Unspecified atrial fibrillation: Secondary | ICD-10-CM | POA: Diagnosis not present

## 2014-12-25 DIAGNOSIS — Z5181 Encounter for therapeutic drug level monitoring: Secondary | ICD-10-CM

## 2014-12-25 LAB — POCT INR: INR: 2.1

## 2014-12-26 ENCOUNTER — Telehealth: Payer: Self-pay | Admitting: *Deleted

## 2014-12-26 NOTE — Telephone Encounter (Signed)
Pt aware, routed to pcp 

## 2014-12-26 NOTE — Telephone Encounter (Signed)
-----   Message from Drema Dallas, [place] sent at 12/25/2014 11:35 AM EST -----   ----- Message -----    From: Arnoldo Lenis, MD    Sent: 12/25/2014  10:56 AM      To: Drema Dallas, CMA  Echo shows heart function is actually improved since 2009. Heart valve remains moderalty leaky but no worst from 2009  J BranchMD

## 2014-12-29 ENCOUNTER — Encounter: Payer: Medicare Other | Admitting: Cardiology

## 2014-12-29 NOTE — Progress Notes (Signed)
Patient ID: [name] [name], female   DOB: Jul 09, 1940, 74 y.o.   MRN: [id number]   ERROR

## 2014-12-30 ENCOUNTER — Ambulatory Visit: Payer: Medicare Other | Admitting: Internal Medicine

## 2014-12-31 ENCOUNTER — Ambulatory Visit: Payer: Medicare Other | Admitting: Gastroenterology

## 2015-01-01 ENCOUNTER — Ambulatory Visit (INDEPENDENT_AMBULATORY_CARE_PROVIDER_SITE_OTHER): Payer: Medicare Other | Admitting: Cardiology

## 2015-01-01 ENCOUNTER — Encounter: Payer: Self-pay | Admitting: *Deleted

## 2015-01-01 ENCOUNTER — Encounter: Payer: Self-pay | Admitting: Cardiology

## 2015-01-01 VITALS — BP 102/66 | HR 81 | Ht 68.0 in | Wt 216.0 lb

## 2015-01-01 DIAGNOSIS — I429 Cardiomyopathy, unspecified: Secondary | ICD-10-CM

## 2015-01-01 DIAGNOSIS — I10 Essential (primary) hypertension: Secondary | ICD-10-CM | POA: Diagnosis not present

## 2015-01-01 DIAGNOSIS — I428 Other cardiomyopathies: Secondary | ICD-10-CM

## 2015-01-01 MED ORDER — TORSEMIDE 20 MG PO TABS: 40.0000 mg | ORAL_TABLET | Freq: Two times a day (BID) | ORAL | Status: AC

## 2015-01-01 NOTE — Patient Instructions (Signed)
Your physician recommends that you schedule a follow-up appointment in: 1 MONTH WITH DR BRANCH  Your physician has recommended you make the following change in your medication:   STOP LASIX   START TORSEMIDE 40 MG TWICE DAILY   Your physician recommends that you return for lab work in: 2 WEEKS BMP/MG  Thank you for choosing [facility]!!     

## 2015-01-01 NOTE — Progress Notes (Signed)
Patient ID: [name] [name], female   DOB: March 28, 1940, 74 y.o.   MRN: [id number]     Clinical Summary [name] [name] is a 74 y.o.female 74 y.o.female seen today for follow up of the following medical problems. This is a focused visit on her history of NICM and recent LE edema.   1. NICM - echo 04/2007 LVEF 20% - echo 11/2014 LVEF 50-55%, grade II diastolic dysfunction - has BiV AICD followed by Dr Lovena Le.  - notes some LE edema at times, worst since recent admission with blood transfusion. No SOB or DOE. No orthopnea  - last visit she had recently been transfused 4 units of pRBCs for her Fe deficient anemia, and developed some LE edema and SOB. Lasix was increased to 60mg  in AM and 40mg  in PM, however there has been no improvement.    Past Medical History  Diagnosis Date  . Nonischemic cardiomyopathy ([place]) 1998    EF of 25% in 1998, 15% in 2006; catheterization in 3/07-50% D1; luminal      irregularities in the other vessels; PA-65/25. 20% EF in 04/2007 with moderate MR and pulmonary htn; AICD/biventricular pacing--05/2005  . Hypertension   . Atrial fibrillation (HCC)     plus nonsustained ventricular tachycardia --> amiodarone treatment  . Nonsustained ventricular tachycardia ([place])   . OSA (obstructive sleep apnea)     no sleep machine  . Venous stasis   . Gout     Uric acid of 12.5 in 03/2009  . Chronic anticoagulation   . History of lupus   . CHF (congestive heart failure) ([place])   . Chronic kidney disease, stage 4, severely decreased GFR (HCC)     Creatinine of 2.1 in 09/2008, 1.9 in 11/1998 and, 2.3 in 05/2010  . Chronic kidney disease   . Anemia      Allergies  Allergen Reactions  . Penicillins Other (See Comments)    Stiffness     Current Outpatient Prescriptions  Medication Sig Dispense Refill  . acetaminophen (TYLENOL) 500 MG tablet Take 500 mg by mouth every 6 (six) hours as needed (pain). For pain    . allopurinol (ZYLOPRIM) 100 MG tablet Take 100 mg by mouth daily.     Marland Kitchen  amiodarone (PACERONE) 200 MG tablet Take 200 mg by mouth as directed. Take one-half by mouth daily Monday through Friday and one whole tablet on Saturday and Sunday    . buPROPion (WELLBUTRIN SR) 100 MG 12 hr tablet Take 300 mg by mouth daily.    . carvedilol (COREG) 25 MG tablet TAKE 1 TABLET BY MOUTH TWICE A DAY WITH MEALS. 180 tablet 3  . cholecalciferol (VITAMIN D) 1000 UNITS tablet Take 1,000 Units by mouth daily.    . furosemide (LASIX) 40 MG tablet Take 60 mg in morning and 40 mg in afternoon 90 tablet 3  . iron polysaccharides (NIFEREX) 150 MG capsule Take 150 mg by mouth daily.    . pantoprazole (PROTONIX) 40 MG tablet TAKE 1 TABLET BY MOUTH ONCE DAILY. 30 tablet 5  . warfarin (COUMADIN) 5 MG tablet Take 1 tablet daily except 1 1/2 tablets on Thursdays or as directed (Patient taking differently: Take 1 tablet daily except 1 1/2 tablets on Tuesday, Thursdays or as directed) 45 tablet 3   No current facility-administered medications for this visit.     Past Surgical History  Procedure Laterality Date  . Appendectomy  1987  . Incision and drainage intra oral abscess  1991    submandibular abscess  .  Cardiac defibrillator placement  05/2005    Medtronic Insync, Biventricular  . Colonoscopy with esophagogastroduodenoscopy (egd) N/A 03/20/2013    RMR: Gastric erosions and nodularity as described above s/p bx s/p hemostasis cliping/Rectal polyp-removed as described above. Polyp at ileocecal valve and cecum requiring saline/epinephrine injection assistance piecemeal polypectomy with subsequent APC ablation and clip. Stomach biopsy showed mild gastritis with H. pylori. 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 Rectum TA  . Colonoscopy N/A 12/09/2013    RMR: The site of larger tubulovillous adenomas about the ileocecal valve and cecum looked good with no residual polyp tissue at those locations. Multiple smaller polyps throughout the colon as outlined above treated/removed as  described above. tubular adenomas. next TCS 11/2016     Allergies  Allergen Reactions  . Penicillins Other (See Comments)    Stiffness      Family History  Problem Relation Age of Onset  . Heart attack Mother 28  . Kidney failure Father 64  . Heart attack Brother   . Colon cancer Neg Hx      Social History [name] [name] reports that she has been smoking Cigarettes.  She started smoking about 58 years ago. She has a 15 pack-year smoking history. She has never used smokeless tobacco. [name] [name] reports that she does not drink alcohol.   Review of Systems CONSTITUTIONAL: No weight loss, fever, chills, weakness or fatigue.  HEENT: Eyes: No visual loss, blurred vision, double vision or yellow sclerae.No hearing loss, sneezing, congestion, runny nose or sore throat.  SKIN: No rash or itching.  CARDIOVASCULAR: per hpi RESPIRATORY: No shortness of breath, cough or sputum.  GASTROINTESTINAL: No anorexia, nausea, vomiting or diarrhea. No abdominal pain or blood.  GENITOURINARY: No burning on urination, no polyuria NEUROLOGICAL: No headache, dizziness, syncope, paralysis, ataxia, numbness or tingling in the extremities. No change in bowel or bladder control.  MUSCULOSKELETAL: No muscle, back pain, joint pain or stiffness.  LYMPHATICS: No enlarged nodes. No history of splenectomy.  PSYCHIATRIC: No history of depression or anxiety.  ENDOCRINOLOGIC: No reports of sweating, cold or heat intolerance. No polyuria or polydipsia.  Marland Kitchen   Physical Examination Filed Vitals:   01/01/15 1105  BP: 102/66  Pulse: 81   Filed Vitals:   01/01/15 1105  Height: 5\' 8"  (1.727 m)  Weight: 216 lb (97.977 kg)    Gen: resting comfortably, no acute distress HEENT: no scleral icterus, pupils equal round and reactive, no palptable cervical adenopathy,  CV: RRR, no m/r/g, no jvd Resp: Clear to auscultation bilaterally GI: abdomen is soft, non-tender, non-distended, normal bowel sounds, no  hepatosplenomegaly MSK: extremities are warm, 2+ bilateral LE edema  Skin: warm, no rash Neuro:  no focal deficits Psych: appropriate affect   Diagnostic Studies 11/2014 echo Left ventricle: The cavity size was mildly dilated. Wall thickness was increased in a pattern of mild LVH. Systolic function was normal. The estimated ejection fraction was in the range of 50% to 55%. Features are consistent with a pseudonormal left ventricular filling pattern, with concomitant abnormal relaxation and increased filling pressure (grade 2 diastolic dysfunction). Doppler parameters are consistent with high ventricular filling pressure. - Ventricular septum: Septal motion showed abnormal function and dyssynergy. These changes are consistent with right ventricular pacing. - Aortic valve: Mildly calcified annulus. Mildly thickened leaflets. - Mitral valve: Calcified annulus. Mildly thickened leaflets . There was mild to moderate regurgitation. - Left atrium: The atrium was moderately dilated. - Right ventricle: Pacer wire or catheter noted in right ventricle. -  Tricuspid valve: There was moderate regurgitation. - Pulmonary arteries: Systolic pressure was moderately to severely increased. PASP 62 mmHg.    Assessment and Plan  1. NICM - recent LE edema since being admitted and being transfused 4 units pRBCs - will change to torsemide 40mg  bid. Check BMET and Mg in 2 weeks.     F/u 1 month     Arnoldo Lenis, M.D.

## 2015-01-08 ENCOUNTER — Ambulatory Visit: Payer: Medicare Other | Admitting: Gastroenterology

## 2015-01-12 ENCOUNTER — Telehealth: Payer: Self-pay | Admitting: *Deleted

## 2015-01-12 MED ORDER — METOLAZONE 2.5 MG PO TABS: ORAL_TABLET | ORAL | Status: AC

## 2015-01-12 NOTE — Telephone Encounter (Signed)
Pt says both legs are swollen/tight to the point she can't walk since yesterday. Has been taking torsemide 40 mg bid since 01/01/15. Denies CP/SOB/dizziness. Will forward to Dr. Harl Bowie

## 2015-01-12 NOTE — Telephone Encounter (Signed)
Have her take metolazone 2.5mg  once a week for 2 weeks please, no refills.    Zandra Abts MD

## 2015-01-12 NOTE — Telephone Encounter (Signed)
Pt verbalized understanding, medication sent to pharmacy with no refills

## 2015-01-15 ENCOUNTER — Telehealth: Payer: Self-pay | Admitting: Gastroenterology

## 2015-01-15 NOTE — Telephone Encounter (Signed)
error 

## 2015-01-20 ENCOUNTER — Encounter: Payer: Self-pay | Admitting: *Deleted

## 2015-01-26 DIAGNOSIS — I10 Essential (primary) hypertension: Secondary | ICD-10-CM | POA: Diagnosis not present

## 2015-01-29 ENCOUNTER — Encounter: Payer: Self-pay | Admitting: Cardiology

## 2015-01-29 ENCOUNTER — Ambulatory Visit: Payer: Medicare Other | Admitting: Cardiology

## 2015-01-29 NOTE — Progress Notes (Unsigned)
Patient ID: [name] [name], female   DOB: 08/26/1940, 75 y.o.   MRN: [id number]     Clinical Summary [name] [name] is a 75 y.o.female  1. NICM - echo 04/2007 LVEF 20% - echo 11/2014 LVEF 50-55%, grade II diastolic dysfunction - has BiV AICD followed by Dr Lovena Le.  - notes some LE edema at times, worst since recent admission with blood transfusion. No SOB or DOE. No orthopnea  - last visit she had recently been transfused 4 units of pRBCs for her Fe deficient anemia, and developed some LE edema and SOB. Lasix was increased to 60mg  in AM and 40mg  in PM, however there has been no improvement.   - last visit started torsemide 40mg  bid, swelling did not improve. We than added metolazone 2.5mg  once weekly for 2 doses. - Labs??  2. PAF - denies any palpitations - compliant with coumadin, no recent bleeding.   3. VT - has been on low dose amio - had BiV AICD in place.  - no recent palpitations  4. Moderate MR - from echo 2009 - occas swelling as described above  5. CKD IV - followed by Dr Hinda Lenis  6. Fe deficient anemia - noted during recent admit 10/2014, received 4 units pRBCs with Hgb as low as 5.4, discharge Hgb 8.7. - she is followed by GI as outpatient.  Past Medical History  Diagnosis Date  . Nonischemic cardiomyopathy ([place]) 1998    EF of 25% in 1998, 15% in 2006; catheterization in 3/07-50% D1; luminal      irregularities in the other vessels; PA-65/25. 20% EF in 04/2007 with moderate MR and pulmonary htn; AICD/biventricular pacing--05/2005  . Hypertension   . Atrial fibrillation ([facility])     plus nonsustained ventricular tachycardia --> amiodarone treatment  . Nonsustained ventricular tachycardia ([place])   . OSA (obstructive sleep apnea)     no sleep machine  . Venous stasis   . Gout     Uric acid of 12.5 in 03/2009  . Chronic anticoagulation   . History of lupus   . CHF (congestive heart failure) ([place])   . Chronic kidney disease, stage 4, severely decreased GFR (H[facility])       Creatinine of 2.1 in 09/2008, 1.9 in 11/1998 and, 2.3 in 05/2010  . Chronic kidney disease   . Anemia      Allergies  Allergen Reactions  . Penicillins Other (See Comments)    Stiffness     Current Outpatient Prescriptions  Medication Sig Dispense Refill  . acetaminophen (TYLENOL) 500 MG tablet Take 500 mg by mouth every 6 (six) hours as needed (pain). For pain    . allopurinol (ZYLOPRIM) 300 MG tablet Take 1 tablet by mouth daily.    Marland Kitchen amiodarone (PACERONE) 200 MG tablet Take 200 mg by mouth as directed. Take one-half by mouth daily Monday through Friday and one whole tablet on Saturday and Sunday    . buPROPion (WELLBUTRIN SR) 100 MG 12 hr tablet Take 300 mg by mouth daily.    . carvedilol (COREG) 25 MG tablet TAKE 1 TABLET BY MOUTH TWICE A DAY WITH MEALS. 180 tablet 3  . cholecalciferol (VITAMIN D) 1000 UNITS tablet Take 1,000 Units by mouth daily.    . iron polysaccharides (NIFEREX) 150 MG capsule Take 150 mg by mouth daily.    . metolazone (ZAROXOLYN) 2.5 MG tablet TAKE 1 TABLET TODAY AND THE NEXT TABLET 1 WEEK FROM TODAY 2 tablet 0  . pantoprazole (PROTONIX) 40 MG tablet TAKE  1 TABLET BY MOUTH ONCE DAILY. 30 tablet 5  . torsemide (DEMADEX) 20 MG tablet Take 2 tablets (40 mg total) by mouth 2 (two) times daily. 120 tablet 3  . warfarin (COUMADIN) 5 MG tablet Take 1 tablet daily except 1 1/2 tablets on Thursdays or as directed (Patient taking differently: Take 1 tablet daily except 1 1/2 tablets on Tuesday, Thursdays or as directed) 45 tablet 3   No current facility-administered medications for this visit.     Past Surgical History  Procedure Laterality Date  . Appendectomy  1987  . Incision and drainage intra oral abscess  1991    submandibular abscess  . Cardiac defibrillator placement  05/2005    Medtronic Insync, Biventricular  . Colonoscopy with esophagogastroduodenoscopy (egd) N/A 03/20/2013    RMR: Gastric erosions and nodularity as described above s/p bx s/p  hemostasis cliping/Rectal polyp-removed as described above. Polyp at ileocecal valve and cecum requiring saline/epinephrine injection assistance piecemeal polypectomy with subsequent APC ablation and clip. Stomach biopsy showed mild gastritis with H. pylori. 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 Rectum TA  . Colonoscopy N/A 12/09/2013    RMR: The site of larger tubulovillous adenomas about the ileocecal valve and cecum looked good with no residual polyp tissue at those locations. Multiple smaller polyps throughout the colon as outlined above treated/removed as described above. tubular adenomas. next TCS 11/2016     Allergies  Allergen Reactions  . Penicillins Other (See Comments)    Stiffness      Family History  Problem Relation Age of Onset  . Heart attack Mother 65  . Kidney failure Father 78  . Heart attack Brother   . Colon cancer Neg Hx      Social History [name] [name] reports that she has been smoking Cigarettes.  She started smoking about 58 years ago. She has a 15 pack-year smoking history. She has never used smokeless tobacco. [name] [name] reports that she does not drink alcohol.   Review of Systems CONSTITUTIONAL: No weight loss, fever, chills, weakness or fatigue.  HEENT: Eyes: No visual loss, blurred vision, double vision or yellow sclerae.No hearing loss, sneezing, congestion, runny nose or sore throat.  SKIN: No rash or itching.  CARDIOVASCULAR:  RESPIRATORY: No shortness of breath, cough or sputum.  GASTROINTESTINAL: No anorexia, nausea, vomiting or diarrhea. No abdominal pain or blood.  GENITOURINARY: No burning on urination, no polyuria NEUROLOGICAL: No headache, dizziness, syncope, paralysis, ataxia, numbness or tingling in the extremities. No change in bowel or bladder control.  MUSCULOSKELETAL: No muscle, back pain, joint pain or stiffness.  LYMPHATICS: No enlarged nodes. No history of splenectomy.  PSYCHIATRIC: No history of depression  or anxiety.  ENDOCRINOLOGIC: No reports of sweating, cold or heat intolerance. No polyuria or polydipsia.  Marland Kitchen   Physical Examination There were no vitals filed for this visit. There were no vitals filed for this visit.  Gen: resting comfortably, no acute distress HEENT: no scleral icterus, pupils equal round and reactive, no palptable cervical adenopathy,  CV Resp: Clear to auscultation bilaterally GI: abdomen is soft, non-tender, non-distended, normal bowel sounds, no hepatosplenomegaly MSK: extremities are warm, no edema.  Skin: warm, no rash Neuro:  no focal deficits Psych: appropriate affect   Diagnostic Studies 11/2014 echo Left ventricle: The cavity size was mildly dilated. Wall thickness was increased in a pattern of mild LVH. Systolic function was normal. The estimated ejection fraction was in the range of 50% to 55%. Features are consistent with a pseudonormal  left ventricular filling pattern, with concomitant abnormal relaxation and increased filling pressure (grade 2 diastolic dysfunction). Doppler parameters are consistent with high ventricular filling pressure. - Ventricular septum: Septal motion showed abnormal function and dyssynergy. These changes are consistent with right ventricular pacing. - Aortic valve: Mildly calcified annulus. Mildly thickened leaflets. - Mitral valve: Calcified annulus. Mildly thickened leaflets . There was mild to moderate regurgitation. - Left atrium: The atrium was moderately dilated. - Right ventricle: Pacer wire or catheter noted in right ventricle. - Tricuspid valve: There was moderate regurgitation. - Pulmonary arteries: Systolic pressure was moderately to severely increased. PASP 62 mmHg.    Assessment and Plan  1. NICM - recent LE edema since being admitted and being transfused 4 units pRBCs - will change to torsemide 40mg  bid. Check BMET and Mg in 2 weeks.    2. PAF - no current symptoms.  Continue current meds  3. VT - no current symptoms, continue amio  4. Moderate MR - repeat echo  5. Fe deficient anemia - repeat cbc in 2 weeks.    Arnoldo Lenis, M.D.

## 2015-01-30 ENCOUNTER — Ambulatory Visit (INDEPENDENT_AMBULATORY_CARE_PROVIDER_SITE_OTHER): Payer: Medicare Other | Admitting: Gastroenterology

## 2015-01-30 ENCOUNTER — Encounter: Payer: Self-pay | Admitting: Gastroenterology

## 2015-01-30 VITALS — BP 80/48 | HR 71 | Temp 97.0°F | Ht 68.0 in | Wt 230.4 lb

## 2015-01-30 DIAGNOSIS — D509 Iron deficiency anemia, unspecified: Secondary | ICD-10-CM

## 2015-01-30 DIAGNOSIS — R601 Generalized edema: Secondary | ICD-10-CM | POA: Diagnosis not present

## 2015-01-30 DIAGNOSIS — D638 Anemia in other chronic diseases classified elsewhere: Secondary | ICD-10-CM

## 2015-01-30 DIAGNOSIS — R7989 Other specified abnormal findings of blood chemistry: Secondary | ICD-10-CM | POA: Diagnosis not present

## 2015-01-30 DIAGNOSIS — D649 Anemia, unspecified: Secondary | ICD-10-CM | POA: Diagnosis not present

## 2015-01-30 NOTE — Patient Instructions (Signed)
1. Reviewed Dr. Juel Burrow labs from this week, he did not check your labs for anemia. Please have your labs done today.

## 2015-01-30 NOTE — Progress Notes (Signed)
Primary Care Physician: Wende Neighbors, MD  Primary Gastroenterologist:  Garfield Cornea, MD   Chief Complaint  Patient presents with  . IDA    HPI: [name] [name] is a 75 y.o. female here for follow up of IDA. Since her last OV, she had called in with multiple symptoms including edema, fatigue. Her labs showed change in hemoglobin from 14.3 down to 5.5. She was referred for admission. We did not see patient during that hospitalization. Reportedly transfused with 4 units of packed red blood cells. Hemoglobin 8.7 at time of discharge. She has been heme-negative twice since October.  Continues to have a lot of issues with edema. Still up 15 pounds of fluid. Saw Dr. Harl Bowie yesterday. Diuretics have been adjusted again. Echocardiogram November 2016 with LVEF of 99991111, grade 2 diastolic dysfunction.  Clinically from a GI standpoint she has been doing relatively well. Denies vomiting, constipation or diarrhea. Denies blood in the stool or melena. Continues to have some early satiety so she eats several times daily. Denies heartburn, dysphagia. Maintains PPI and iron supplementation.   Current Outpatient Prescriptions  Medication Sig Dispense Refill  . acetaminophen (TYLENOL) 500 MG tablet Take 500 mg by mouth every 6 (six) hours as needed (pain). For pain    . allopurinol (ZYLOPRIM) 300 MG tablet Take 1 tablet by mouth daily.    Marland Kitchen amiodarone (PACERONE) 200 MG tablet Take 200 mg by mouth as directed. Take one-half by mouth daily Monday through Friday and one whole tablet on Saturday and Sunday    . buPROPion (WELLBUTRIN SR) 100 MG 12 hr tablet Take 300 mg by mouth daily.    . carvedilol (COREG) 25 MG tablet TAKE 1 TABLET BY MOUTH TWICE A DAY WITH MEALS. 180 tablet 3  . cholecalciferol (VITAMIN D) 1000 UNITS tablet Take 1,000 Units by mouth daily.    . metolazone (ZAROXOLYN) 2.5 MG tablet TAKE 1 TABLET TODAY AND THE NEXT TABLET 1 WEEK FROM TODAY 2 tablet 0  . pantoprazole (PROTONIX) 40 MG  tablet TAKE 1 TABLET BY MOUTH ONCE DAILY. 30 tablet 5  . torsemide (DEMADEX) 20 MG tablet Take 2 tablets (40 mg total) by mouth 2 (two) times daily. 120 tablet 3  . warfarin (COUMADIN) 5 MG tablet Take 1 tablet daily except 1 1/2 tablets on Thursdays or as directed (Patient taking differently: Take 1 tablet daily except 1 1/2 tablets on Tuesday, Thursdays or as directed) 45 tablet 3   No current facility-administered medications for this visit.    Allergies as of 01/30/2015 - Review Complete 01/30/2015  Allergen Reaction Noted  . Penicillins Other (See Comments)    Past Surgical History  Procedure Laterality Date  . Appendectomy  1987  . Incision and drainage intra oral abscess  1991    submandibular abscess  . Cardiac defibrillator placement  05/2005    Medtronic Insync, Biventricular  . Colonoscopy with esophagogastroduodenoscopy (egd) N/A 03/20/2013    RMR: Gastric erosions and nodularity as described above s/p bx s/p hemostasis cliping/Rectal polyp-removed as described above. Polyp at ileocecal valve and cecum requiring saline/epinephrine injection assistance piecemeal polypectomy with subsequent APC ablation and clip. Stomach biopsy showed mild gastritis with H. pylori. 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 Rectum TA  . Colonoscopy N/A 12/09/2013    RMR: The site of larger tubulovillous adenomas about the ileocecal valve and cecum looked good with no residual polyp tissue at those locations. Multiple smaller polyps throughout the colon as outlined above  treated/removed as described above. tubular adenomas. next TCS 11/2016    ROS:  General: Negative for anorexia, weight loss, fever, chills, fatigue, weakness. ENT: Negative for hoarseness, difficulty swallowing , nasal congestion. CV: Negative for chest pain, angina, palpitations. + dyspnea on exertion. + peripheral edema.  Respiratory: Negative for dyspnea at rest, dyspnea on exertion, cough, sputum, wheezing.    GI: See history of present illness. GU:  Negative for dysuria, hematuria, urinary incontinence, urinary frequency, nocturnal urination.  Endo: see hpi   Physical Examination:   BP 80/48 mmHg  Pulse 71  Temp(Src) 97 F (36.1 C)  Ht 5\' 8"  (1.727 m)  Wt 230 lb 6.4 oz (104.509 kg)  BMI 35.04 kg/m2  General: Well-nourished, well-developed in no acute distress.  Eyes: No icterus. Mouth: Oropharyngeal mucosa moist and pink , no lesions erythema or exudate. Lungs: Clear to auscultation bilaterally.  Heart: Regular rate and rhythm, no murmurs rubs or gallops.  Abdomen: Bowel sounds are normal, nontender, nondistended, no hepatosplenomegaly or masses, no abdominal bruits or hernia , no rebound or guarding.  1+ pitting edema dependent abdominal wall. Extremities: 2+pitting edema to knees bilaterally, 1+ to thighs. No clubbing or deformities. Neuro: Alert and oriented x 4   Skin: Warm and dry, no jaundice.   Psych: Alert and cooperative, normal mood and affect.  Labs:  Labs from outside source (PCP) dictated 01/26/2015 Sodium 142, potassium 3.8, BUN 57, creatinine 2.8, total bilirubin 0.6, alkaline phosphatase 56, AST 14, ALT 7, albumin 3.4, calcium 8.5  Labs from November 2016 PCP Iron 11 low, TIBC 428, iron saturation 3%, hemoglobin 10.4, hematocrit 35.8, MCV 77.5, platelets 427,000, creatinine 2.85, BUN 41  Imaging Studies: No results found.

## 2015-01-31 DIAGNOSIS — R05 Cough: Secondary | ICD-10-CM | POA: Diagnosis not present

## 2015-01-31 DIAGNOSIS — I5032 Chronic diastolic (congestive) heart failure: Secondary | ICD-10-CM | POA: Diagnosis not present

## 2015-01-31 DIAGNOSIS — R279 Unspecified lack of coordination: Secondary | ICD-10-CM | POA: Diagnosis not present

## 2015-01-31 DIAGNOSIS — N184 Chronic kidney disease, stage 4 (severe): Secondary | ICD-10-CM | POA: Diagnosis not present

## 2015-01-31 DIAGNOSIS — R7989 Other specified abnormal findings of blood chemistry: Secondary | ICD-10-CM | POA: Diagnosis not present

## 2015-01-31 DIAGNOSIS — J9612 Chronic respiratory failure with hypercapnia: Secondary | ICD-10-CM | POA: Diagnosis not present

## 2015-01-31 DIAGNOSIS — N179 Acute kidney failure, unspecified: Secondary | ICD-10-CM | POA: Diagnosis not present

## 2015-01-31 DIAGNOSIS — Z95810 Presence of automatic (implantable) cardiac defibrillator: Secondary | ICD-10-CM | POA: Diagnosis not present

## 2015-01-31 DIAGNOSIS — Z7401 Bed confinement status: Secondary | ICD-10-CM | POA: Diagnosis not present

## 2015-01-31 DIAGNOSIS — D500 Iron deficiency anemia secondary to blood loss (chronic): Secondary | ICD-10-CM | POA: Diagnosis not present

## 2015-01-31 DIAGNOSIS — Z880 Allergy status to penicillin: Secondary | ICD-10-CM | POA: Diagnosis not present

## 2015-01-31 DIAGNOSIS — Z950 Presence of cardiac pacemaker: Secondary | ICD-10-CM | POA: Diagnosis not present

## 2015-01-31 DIAGNOSIS — R0602 Shortness of breath: Secondary | ICD-10-CM | POA: Diagnosis not present

## 2015-01-31 DIAGNOSIS — I081 Rheumatic disorders of both mitral and tricuspid valves: Secondary | ICD-10-CM | POA: Diagnosis not present

## 2015-01-31 DIAGNOSIS — D649 Anemia, unspecified: Secondary | ICD-10-CM | POA: Diagnosis not present

## 2015-01-31 DIAGNOSIS — I509 Heart failure, unspecified: Secondary | ICD-10-CM | POA: Diagnosis not present

## 2015-01-31 DIAGNOSIS — J441 Chronic obstructive pulmonary disease with (acute) exacerbation: Secondary | ICD-10-CM | POA: Diagnosis not present

## 2015-01-31 DIAGNOSIS — Z7901 Long term (current) use of anticoagulants: Secondary | ICD-10-CM | POA: Diagnosis not present

## 2015-01-31 DIAGNOSIS — I4891 Unspecified atrial fibrillation: Secondary | ICD-10-CM | POA: Diagnosis not present

## 2015-01-31 DIAGNOSIS — J811 Chronic pulmonary edema: Secondary | ICD-10-CM | POA: Diagnosis not present

## 2015-01-31 DIAGNOSIS — I130 Hypertensive heart and chronic kidney disease with heart failure and stage 1 through stage 4 chronic kidney disease, or unspecified chronic kidney disease: Secondary | ICD-10-CM | POA: Diagnosis not present

## 2015-01-31 DIAGNOSIS — I519 Heart disease, unspecified: Secondary | ICD-10-CM | POA: Diagnosis not present

## 2015-01-31 DIAGNOSIS — J9602 Acute respiratory failure with hypercapnia: Secondary | ICD-10-CM | POA: Diagnosis not present

## 2015-01-31 DIAGNOSIS — I517 Cardiomegaly: Secondary | ICD-10-CM | POA: Diagnosis not present

## 2015-01-31 DIAGNOSIS — I482 Chronic atrial fibrillation: Secondary | ICD-10-CM | POA: Diagnosis not present

## 2015-01-31 DIAGNOSIS — K219 Gastro-esophageal reflux disease without esophagitis: Secondary | ICD-10-CM | POA: Diagnosis not present

## 2015-01-31 DIAGNOSIS — K922 Gastrointestinal hemorrhage, unspecified: Secondary | ICD-10-CM | POA: Diagnosis not present

## 2015-02-02 NOTE — Assessment & Plan Note (Signed)
75 year old female with history of mixed anemia, IDA/anemia chronic disease in the setting of renal insufficiency. Previously required IV iron infusions in the past. Has generally been maintained on oral iron supplementation more recently. History of H. pylori gastritis with proven eradication via stool antigen. Colonoscopy with history of advanced polyps as previously outlined, up-to-date. Small bowel capsule endoscopy could not be done because of history of defibrillator. Because of renal insufficiency CT enterography would have to be done with oral contrast only. Previously elected to hold off given improvement of her iron.  Recent hospitalization in October when she presented with symptomatic anemia, drop from normal hemoglobin of 5 requiring 4 units of packed red blood cells. No overt GI bleeding. She was heme-negative twice. Symptoms were associated with anasarca. She continues to have 15+ pounds of fluid. Echocardiogram in November as outlined above. Followed closely by cardiology. Based on normal albumin, platelets would not suspect advanced liver disease therefore difficult to explain anasarca based on liver.  At this time we need to update her hemoglobin. Further recommendations to follow.

## 2015-02-02 NOTE — Progress Notes (Signed)
cc'ed to pcp °

## 2015-02-03 ENCOUNTER — Encounter: Payer: Medicare Other | Admitting: Cardiology

## 2015-02-03 ENCOUNTER — Encounter: Payer: Self-pay | Admitting: Cardiology

## 2015-02-03 NOTE — Progress Notes (Signed)
Patient ID: [name] [name], female   DOB: Feb 11, 1940, 75 y.o.   MRN: [id number]   Encounter opened in error

## 2015-02-06 DIAGNOSIS — J441 Chronic obstructive pulmonary disease with (acute) exacerbation: Secondary | ICD-10-CM | POA: Diagnosis not present

## 2015-02-06 DIAGNOSIS — I482 Chronic atrial fibrillation: Secondary | ICD-10-CM | POA: Diagnosis not present

## 2015-02-06 DIAGNOSIS — J9612 Chronic respiratory failure with hypercapnia: Secondary | ICD-10-CM | POA: Diagnosis not present

## 2015-02-07 DIAGNOSIS — R279 Unspecified lack of coordination: Secondary | ICD-10-CM | POA: Diagnosis not present

## 2015-02-07 DIAGNOSIS — I130 Hypertensive heart and chronic kidney disease with heart failure and stage 1 through stage 4 chronic kidney disease, or unspecified chronic kidney disease: Secondary | ICD-10-CM | POA: Diagnosis not present

## 2015-02-07 DIAGNOSIS — D649 Anemia, unspecified: Secondary | ICD-10-CM | POA: Diagnosis not present

## 2015-02-07 DIAGNOSIS — N184 Chronic kidney disease, stage 4 (severe): Secondary | ICD-10-CM | POA: Diagnosis not present

## 2015-02-07 DIAGNOSIS — Z7401 Bed confinement status: Secondary | ICD-10-CM | POA: Diagnosis not present

## 2015-02-07 DIAGNOSIS — I509 Heart failure, unspecified: Secondary | ICD-10-CM | POA: Diagnosis not present

## 2015-02-07 DIAGNOSIS — Z95810 Presence of automatic (implantable) cardiac defibrillator: Secondary | ICD-10-CM | POA: Diagnosis not present

## 2015-02-07 DIAGNOSIS — Z7901 Long term (current) use of anticoagulants: Secondary | ICD-10-CM | POA: Diagnosis not present

## 2015-02-07 DIAGNOSIS — I4891 Unspecified atrial fibrillation: Secondary | ICD-10-CM | POA: Diagnosis not present

## 2015-02-07 DIAGNOSIS — R05 Cough: Secondary | ICD-10-CM | POA: Diagnosis not present

## 2015-02-25 DIAGNOSIS — 419620001 Death: Secondary | SNOMED CT | POA: Diagnosis not present

## 2015-02-25 DEATH — deceased

## 2015-08-03 ENCOUNTER — Encounter: Payer: Self-pay | Admitting: Internal Medicine

## 2016-10-28 ENCOUNTER — Encounter: Payer: Self-pay | Admitting: Internal Medicine

## 2017-06-07 ENCOUNTER — Ambulatory Visit: Payer: Self-pay | Admitting: *Deleted
